# Patient Record
Sex: Female | Born: 1970
Health system: Southern US, Community
[De-identification: ages and names within clinical notes are randomized; demographics above are authoritative.]

## PROBLEM LIST (undated history)

## (undated) DIAGNOSIS — F32A Depression, unspecified: Secondary | ICD-10-CM

## (undated) DIAGNOSIS — R519 Headache, unspecified: Secondary | ICD-10-CM

## (undated) DIAGNOSIS — G4730 Sleep apnea, unspecified: Secondary | ICD-10-CM

## (undated) DIAGNOSIS — Z9889 Other specified postprocedural states: Secondary | ICD-10-CM

## (undated) DIAGNOSIS — R748 Abnormal levels of other serum enzymes: Secondary | ICD-10-CM

## (undated) DIAGNOSIS — Z8489 Family history of other specified conditions: Secondary | ICD-10-CM

## (undated) DIAGNOSIS — R112 Nausea with vomiting, unspecified: Secondary | ICD-10-CM

## (undated) DIAGNOSIS — F329 Major depressive disorder, single episode, unspecified: Secondary | ICD-10-CM

## (undated) HISTORY — PX: MASTOPEXY: SUR857

## (undated) HISTORY — PX: OTHER SURGICAL HISTORY: SHX169

## (undated) HISTORY — PX: TUBAL LIGATION: SHX77

## (undated) HISTORY — PX: BREAST SURGERY: SHX581

## (undated) HISTORY — PX: REDUCTION MAMMAPLASTY: SUR839

## (undated) HISTORY — DX: Depression, unspecified: F32.A

## (undated) HISTORY — DX: Major depressive disorder, single episode, unspecified: F32.9

## (undated) HISTORY — PX: NASAL SINUS SURGERY: SHX719

---

## 2008-10-08 ENCOUNTER — Ambulatory Visit: Payer: Self-pay | Admitting: Internal Medicine

## 2008-10-08 DIAGNOSIS — F329 Major depressive disorder, single episode, unspecified: Secondary | ICD-10-CM | POA: Insufficient documentation

## 2008-10-08 DIAGNOSIS — R5383 Other fatigue: Secondary | ICD-10-CM

## 2008-10-08 DIAGNOSIS — D235 Other benign neoplasm of skin of trunk: Secondary | ICD-10-CM | POA: Insufficient documentation

## 2008-10-08 DIAGNOSIS — R5381 Other malaise: Secondary | ICD-10-CM | POA: Insufficient documentation

## 2008-10-08 DIAGNOSIS — J019 Acute sinusitis, unspecified: Secondary | ICD-10-CM | POA: Insufficient documentation

## 2008-10-08 LAB — CONVERTED CEMR LAB
Albumin: 4.4 g/dL (ref 3.5–5.2)
BUN: 11 mg/dL (ref 6–23)
Basophils Absolute: 0.5 10*3/uL — ABNORMAL HIGH (ref 0.0–0.1)
CO2: 31 meq/L (ref 19–32)
Calcium: 9.6 mg/dL (ref 8.4–10.5)
Direct LDL: 141.7 mg/dL
Eosinophils Absolute: 0.4 10*3/uL (ref 0.0–0.7)
GFR calc non Af Amer: 100.17 mL/min (ref >60)
Glucose, Bld: 96 mg/dL (ref 70–99)
HCT: 42.3 % (ref 36.0–46.0)
HDL: 54.8 mg/dL (ref >39.00)
Hemoglobin: 14.3 g/dL (ref 12.0–15.0)
Iron: 125 ug/dL (ref 42–145)
Ketones, ur: NEGATIVE mg/dL
Leukocytes, UA: NEGATIVE
Lymphs Abs: 3.9 10*3/uL (ref 0.7–4.0)
MCHC: 33.7 g/dL (ref 30.0–36.0)
Monocytes Absolute: 0.5 10*3/uL (ref 0.1–1.0)
Monocytes Relative: 5.1 % (ref 3.0–12.0)
Neutro Abs: 5.1 10*3/uL (ref 1.4–7.7)
Nitrite: NEGATIVE
Platelets: 404 10*3/uL — ABNORMAL HIGH (ref 150.0–400.0)
Potassium: 3.7 meq/L (ref 3.5–5.1)
RDW: 11.9 % (ref 11.5–14.6)
Saturation Ratios: 31 % (ref 20.0–50.0)
Sodium: 142 meq/L (ref 135–145)
Specific Gravity, Urine: 1.015 (ref 1.000–1.030)
TSH: 2.11 microintl units/mL (ref 0.35–5.50)
Total Bilirubin: 0.8 mg/dL (ref 0.3–1.2)
Transferrin: 287.7 mg/dL (ref 212.0–360.0)
Triglycerides: 134 mg/dL (ref 0.0–149.0)
Urobilinogen, UA: 0.2 (ref 0.0–1.0)
pH: 7.5 (ref 5.0–8.0)

## 2008-10-09 ENCOUNTER — Encounter: Payer: Self-pay | Admitting: Internal Medicine

## 2008-10-13 ENCOUNTER — Encounter (INDEPENDENT_AMBULATORY_CARE_PROVIDER_SITE_OTHER): Payer: Self-pay | Admitting: *Deleted

## 2008-10-15 ENCOUNTER — Telehealth: Payer: Self-pay | Admitting: Internal Medicine

## 2009-03-10 ENCOUNTER — Telehealth: Payer: Self-pay | Admitting: Internal Medicine

## 2009-03-19 ENCOUNTER — Telehealth: Payer: Self-pay | Admitting: Internal Medicine

## 2010-03-23 NOTE — Progress Notes (Signed)
Summary: REQ FOR RX  Phone Note Call from Patient Call back at [phone]   Summary of Call: Pt c/o productive cough w/yellow mucus, body aches, and sinus congestion/drainage since friday. She has tried sudafed, claritin and mucinex w/no relief. Patient is requesting rx, she is unable to come into the office.  Initial call taken by: Lamar Sprinkles, CMA,  March 19, 2009 11:59 AM  Follow-up for Phone Call        done Follow-up by: Etta Grandchild MD,  March 19, 2009 12:02 PM  Additional Follow-up for Phone Call Additional follow up Details #1::        Pt informed  Additional Follow-up by: Lamar Sprinkles, CMA,  March 19, 2009 2:10 PM    New/Updated Medications: ZITHROMAX TRI-PAK 500 MG TAB (AZITHROMYCIN) Take as directed once daily for 3 days Prescriptions: ZITHROMAX TRI-PAK 500 MG TAB (AZITHROMYCIN) Take as directed once daily for 3 days  #3 x 0   Entered and Authorized by:   Etta Grandchild MD   Signed by:   Etta Grandchild MD on 03/19/2009   Method used:   Electronically to        [facility] #[id number]* (retail)       [address]       [place], [place]  [address]       Ph: [phone]       Fax: [phone]   RxID:   [id number]

## 2010-03-23 NOTE — Progress Notes (Signed)
Summary: Rx refills  Phone Note Call from Patient Call back at Home Phone [phone]   Caller: Patient Call For: Etta Grandchild MD Reason for Call: Refill Medication Summary of Call: Patient came into the office requesting refills of Buspirone and Lexapro. Initial call taken by: Irma Newness,  March 10, 2009 4:55 PM  Follow-up for Phone Call        Sent rx for wellbutrin/ pt should still have refills left on lexapro. Follow-up by: Rock Nephew CMA,  March 13, 2009 8:28 AM    Prescriptions: WELLBUTRIN XL 150 MG XR24H-TAB (BUPROPION HCL) One by mouth qam  #30 x 4   Entered by:   Rock Nephew CMA   Authorized by:   Etta Grandchild MD   Signed by:   Rock Nephew CMA on 03/11/2009   Method used:   Electronically to        [facility] #[id number]* (retail)       [address]       [place], [place]  [address]       Ph: [phone]       Fax: [phone]   RxID:   [id number]

## 2011-10-10 ENCOUNTER — Ambulatory Visit (INDEPENDENT_AMBULATORY_CARE_PROVIDER_SITE_OTHER): Payer: BC Managed Care – PPO | Admitting: Internal Medicine

## 2011-10-10 ENCOUNTER — Encounter: Payer: Self-pay | Admitting: Internal Medicine

## 2011-10-10 VITALS — BP 116/80 | HR 66 | Temp 97.9°F | Resp 15 | Wt 157.2 lb

## 2011-10-10 DIAGNOSIS — F329 Major depressive disorder, single episode, unspecified: Secondary | ICD-10-CM

## 2011-10-10 DIAGNOSIS — F3289 Other specified depressive episodes: Secondary | ICD-10-CM

## 2011-10-10 DIAGNOSIS — Z23 Encounter for immunization: Secondary | ICD-10-CM

## 2011-10-10 MED ORDER — DULOXETINE HCL 60 MG PO CPEP: 60.0000 mg | ORAL_CAPSULE | Freq: Every day | ORAL | Status: DC

## 2011-10-10 NOTE — Assessment & Plan Note (Signed)
Start cymbalta and begin psychotherapy

## 2011-10-10 NOTE — Progress Notes (Signed)
  Subjective:    Patient ID: Jennifer Mcknight, female    DOB: [birth year], 41 y.o.   MRN: [id number]  HPI She returns and complains of depression s/s for one month with ruminations, sleep disturbance, weight loss, anhedonia, crying spells, and fatigue.   Review of Systems  Constitutional: Positive for unexpected weight change (some weight loss). Negative for fever, chills, diaphoresis, activity change, appetite change and fatigue.  HENT: Negative.   Eyes: Negative.   Respiratory: Negative.  Negative for cough, chest tightness, shortness of breath, wheezing and stridor.   Cardiovascular: Negative for chest pain, palpitations and leg swelling.  Gastrointestinal: Negative.   Genitourinary: Negative.   Musculoskeletal: Negative.   Skin: Negative.   Neurological: Negative.   Hematological: Negative for adenopathy. Does not bruise/bleed easily.  Psychiatric/Behavioral: Positive for disturbed wake/sleep cycle, dysphoric mood and decreased concentration. Negative for suicidal ideas, hallucinations, behavioral problems, confusion, self-injury and agitation. The patient is not nervous/anxious and is not hyperactive.        Objective:   Physical Exam  Vitals reviewed. Constitutional: She is oriented to person, place, and time. She appears well-developed and well-nourished. No distress.  HENT:  Head: Normocephalic and atraumatic.  Mouth/Throat: Oropharynx is clear and moist. No oropharyngeal exudate.  Eyes: Conjunctivae are normal. Right eye exhibits no discharge. Left eye exhibits no discharge. No scleral icterus.  Neck: Normal range of motion. Neck supple. No JVD present. No tracheal deviation present. No thyromegaly present.  Cardiovascular: Normal rate, regular rhythm, normal heart sounds and intact distal pulses.  Exam reveals no gallop and no friction rub.   No murmur heard. Pulmonary/Chest: Effort normal and breath sounds normal. No stridor. No respiratory distress. She has no wheezes. She has  no rales. She exhibits no tenderness.  Abdominal: Soft. Bowel sounds are normal. She exhibits no distension and no mass. There is no tenderness. There is no rebound and no guarding.  Lymphadenopathy:    She has no cervical adenopathy.  Neurological: She is oriented to person, place, and time.  Skin: Skin is warm and dry. No rash noted. She is not diaphoretic. No erythema. No pallor.  Psychiatric: Her behavior is normal. Judgment and thought content normal. Her mood appears not anxious. Her affect is not angry, not blunt, not labile and not inappropriate. Her speech is not rapid and/or pressured, not delayed, not tangential and not slurred. Cognition and memory are normal. She exhibits a depressed mood (tearful). She is communicative.          Assessment & Plan:

## 2011-10-10 NOTE — Patient Instructions (Signed)

## 2011-10-21 ENCOUNTER — Ambulatory Visit (INDEPENDENT_AMBULATORY_CARE_PROVIDER_SITE_OTHER): Payer: BC Managed Care – PPO | Admitting: Psychology

## 2011-10-21 DIAGNOSIS — F411 Generalized anxiety disorder: Secondary | ICD-10-CM

## 2011-10-21 DIAGNOSIS — F331 Major depressive disorder, recurrent, moderate: Secondary | ICD-10-CM

## 2011-10-31 ENCOUNTER — Ambulatory Visit (INDEPENDENT_AMBULATORY_CARE_PROVIDER_SITE_OTHER): Payer: BC Managed Care – PPO | Admitting: Psychology

## 2011-10-31 DIAGNOSIS — F331 Major depressive disorder, recurrent, moderate: Secondary | ICD-10-CM

## 2011-10-31 DIAGNOSIS — F411 Generalized anxiety disorder: Secondary | ICD-10-CM

## 2011-11-07 ENCOUNTER — Telehealth: Payer: Self-pay | Admitting: Internal Medicine

## 2011-11-07 DIAGNOSIS — F3289 Other specified depressive episodes: Secondary | ICD-10-CM

## 2011-11-07 DIAGNOSIS — F329 Major depressive disorder, single episode, unspecified: Secondary | ICD-10-CM

## 2011-11-07 MED ORDER — ESCITALOPRAM OXALATE 10 MG PO TABS: 10.0000 mg | ORAL_TABLET | Freq: Every day | ORAL | Status: DC

## 2011-11-07 NOTE — Telephone Encounter (Signed)
Caller: Jennifer Mcknight/Patient; Patient Name: Jennifer Mcknight; PCP: Sanda Linger (Adults only); Best Callback Phone Number: [phone].  Pt reports she has been taking Cymbalta for a month.  Pt states her depression is worse.  She wants to know if she should fill the Cymbatla RX or get represcribed the Lexapro.  Pt states she is really lethargic and wants to sleep all the time.  Triaged patient per Depression Protocol.  Call Provider Within 24 Hours Disposition for 'New or increasing symptoms and taking medications/following therapy as prescribed'.  PLEASE FOLLOW UP WITH PT REGARDING CONTINUING CYMBALTA OR SWITCHING MEDICATION TO LEXAPRO.

## 2011-11-07 NOTE — Telephone Encounter (Signed)
Go back to lexapro

## 2011-11-07 NOTE — Telephone Encounter (Signed)
LMOVM for Pt to return call.  

## 2011-11-07 NOTE — Telephone Encounter (Signed)
Pt stated that would be fine. Could a script be sent in to [facility] in [place].

## 2011-11-07 NOTE — Telephone Encounter (Signed)
done

## 2011-11-08 NOTE — Telephone Encounter (Signed)
Pt informed rx sent to Pharmacy via VM and to callback office with any questions/concerns.

## 2011-11-21 ENCOUNTER — Ambulatory Visit (INDEPENDENT_AMBULATORY_CARE_PROVIDER_SITE_OTHER): Payer: BC Managed Care – PPO | Admitting: Psychology

## 2011-11-21 DIAGNOSIS — F331 Major depressive disorder, recurrent, moderate: Secondary | ICD-10-CM

## 2011-11-21 DIAGNOSIS — F411 Generalized anxiety disorder: Secondary | ICD-10-CM

## 2011-12-19 ENCOUNTER — Ambulatory Visit (INDEPENDENT_AMBULATORY_CARE_PROVIDER_SITE_OTHER): Payer: BC Managed Care – PPO | Admitting: Psychology

## 2011-12-19 DIAGNOSIS — F331 Major depressive disorder, recurrent, moderate: Secondary | ICD-10-CM

## 2011-12-19 DIAGNOSIS — F411 Generalized anxiety disorder: Secondary | ICD-10-CM

## 2013-03-21 ENCOUNTER — Other Ambulatory Visit (INDEPENDENT_AMBULATORY_CARE_PROVIDER_SITE_OTHER): Payer: BC Managed Care – PPO

## 2013-03-21 ENCOUNTER — Encounter: Payer: Self-pay | Admitting: Internal Medicine

## 2013-03-21 ENCOUNTER — Ambulatory Visit (INDEPENDENT_AMBULATORY_CARE_PROVIDER_SITE_OTHER): Payer: BC Managed Care – PPO | Admitting: Internal Medicine

## 2013-03-21 VITALS — BP 120/84 | HR 66 | Temp 98.0°F | Resp 16 | Ht 67.0 in | Wt 176.0 lb

## 2013-03-21 DIAGNOSIS — F3289 Other specified depressive episodes: Secondary | ICD-10-CM

## 2013-03-21 DIAGNOSIS — Z0000 Encounter for general adult medical examination without abnormal findings: Secondary | ICD-10-CM

## 2013-03-21 DIAGNOSIS — F329 Major depressive disorder, single episode, unspecified: Secondary | ICD-10-CM

## 2013-03-21 LAB — COMPREHENSIVE METABOLIC PANEL
ALBUMIN: 4.4 g/dL (ref 3.5–5.2)
ALT: 21 U/L (ref 0–35)
AST: 22 U/L (ref 0–37)
Alkaline Phosphatase: 53 U/L (ref 39–117)
BUN: 12 mg/dL (ref 6–23)
CALCIUM: 9.6 mg/dL (ref 8.4–10.5)
CHLORIDE: 102 meq/L (ref 96–112)
CO2: 27 mEq/L (ref 19–32)
CREATININE: 0.6 mg/dL (ref 0.4–1.2)
GFR: 116.94 mL/min (ref >60.00)
Glucose, Bld: 86 mg/dL (ref 70–99)
Potassium: 3.8 mEq/L (ref 3.5–5.1)
Sodium: 137 mEq/L (ref 135–145)
TOTAL PROTEIN: 7.9 g/dL (ref 6.0–8.3)
Total Bilirubin: 0.7 mg/dL (ref 0.3–1.2)

## 2013-03-21 LAB — CBC WITH DIFFERENTIAL/PLATELET
BASOS ABS: 0 10*3/uL (ref 0.0–0.1)
Basophils Relative: 0.3 % (ref 0.0–3.0)
Eosinophils Absolute: 0.5 10*3/uL (ref 0.0–0.7)
Eosinophils Relative: 4.5 % (ref 0.0–5.0)
HCT: 45.4 % (ref 36.0–46.0)
Hemoglobin: 14.8 g/dL (ref 12.0–15.0)
LYMPHS PCT: 44 % (ref 12.0–46.0)
Lymphs Abs: 4.9 10*3/uL — ABNORMAL HIGH (ref 0.7–4.0)
MCHC: 32.7 g/dL (ref 30.0–36.0)
MCV: 89.3 fl (ref 78.0–100.0)
MONOS PCT: 6.5 % (ref 3.0–12.0)
Monocytes Absolute: 0.7 10*3/uL (ref 0.1–1.0)
NEUTROS PCT: 44.7 % (ref 43.0–77.0)
Neutro Abs: 5 10*3/uL (ref 1.4–7.7)
PLATELETS: 410 10*3/uL — AB (ref 150.0–400.0)
RBC: 5.08 Mil/uL (ref 3.87–5.11)
RDW: 13.3 % (ref 11.5–14.6)
WBC: 11.3 10*3/uL — ABNORMAL HIGH (ref 4.5–10.5)

## 2013-03-21 LAB — URINALYSIS, ROUTINE W REFLEX MICROSCOPIC
Bilirubin Urine: NEGATIVE
Ketones, ur: NEGATIVE
Leukocytes, UA: NEGATIVE
Nitrite: NEGATIVE
Total Protein, Urine: NEGATIVE
UROBILINOGEN UA: 0.2 (ref 0.0–1.0)
Urine Glucose: NEGATIVE
pH: 5.5 (ref 5.0–8.0)

## 2013-03-21 LAB — LDL CHOLESTEROL, DIRECT: Direct LDL: 123.1 mg/dL

## 2013-03-21 LAB — LIPID PANEL
CHOLESTEROL: 206 mg/dL — AB (ref 0–200)
HDL: 58.2 mg/dL (ref >39.00)
Total CHOL/HDL Ratio: 4
Triglycerides: 111 mg/dL (ref 0.0–149.0)
VLDL: 22.2 mg/dL (ref 0.0–40.0)

## 2013-03-21 LAB — TSH: TSH: 2.06 u[IU]/mL (ref 0.35–5.50)

## 2013-03-21 MED ORDER — LEVOMILNACIPRAN HCL ER 80 MG PO CP24: 1.0000 | ORAL_CAPSULE | Freq: Every day | ORAL | Status: DC

## 2013-03-21 NOTE — Patient Instructions (Signed)
Preventive Care for Adults, Female A healthy lifestyle and preventive care can promote health and wellness. Preventive health guidelines for women include the following key practices.  A routine yearly physical is a good way to check with your health care provider about your health and preventive screening. It is a chance to share any concerns and updates on your health and to receive a thorough exam.  Visit your dentist for a routine exam and preventive care every 6 months. Brush your teeth twice a day and floss once a day. Good oral hygiene prevents tooth decay and gum disease.  The frequency of eye exams is based on your age, health, family medical history, use of contact lenses, and other factors. Follow your health care provider's recommendations for frequency of eye exams.  Eat a healthy diet. Foods like vegetables, fruits, whole grains, low-fat dairy products, and lean protein foods contain the nutrients you need without too many calories. Decrease your intake of foods high in solid fats, added sugars, and salt. Eat the right amount of calories for you.Get information about a proper diet from your health care provider, if necessary.  Regular physical exercise is one of the most important things you can do for your health. Most adults should get at least 150 minutes of moderate-intensity exercise (any activity that increases your heart rate and causes you to sweat) each week. In addition, most adults need muscle-strengthening exercises on 2 or more days a week.  Maintain a healthy weight. The body mass index (BMI) is a screening tool to identify possible weight problems. It provides an estimate of body fat based on height and weight. Your health care provider can find your BMI, and can help you achieve or maintain a healthy weight.For adults 20 years and older:  A BMI below 18.5 is considered underweight.  A BMI of 18.5 to 24.9 is normal.  A BMI of 25 to 29.9 is considered overweight.  A  BMI of 30 and above is considered obese.  Maintain normal blood lipids and cholesterol levels by exercising and minimizing your intake of saturated fat. Eat a balanced diet with plenty of fruit and vegetables. Blood tests for lipids and cholesterol should begin at age 62 and be repeated every 5 years. If your lipid or cholesterol levels are high, you are over 50, or you are at high risk for heart disease, you may need your cholesterol levels checked more frequently.Ongoing high lipid and cholesterol levels should be treated with medicines if diet and exercise are not working.  If you smoke, find out from your health care provider how to quit. If you do not use tobacco, do not start.  Lung cancer screening is recommended for adults aged 36 80 years who are at high risk for developing lung cancer because of a history of smoking. A yearly low-dose CT scan of the lungs is recommended for people who have at least a 30-pack-year history of smoking and are a current smoker or have quit within the past 15 years. A pack year of smoking is smoking an average of 1 pack of cigarettes a day for 1 year (for example: 1 pack a day for 30 years or 2 packs a day for 15 years). Yearly screening should continue until the smoker has stopped smoking for at least 15 years. Yearly screening should be stopped for people who develop a health problem that would prevent them from having lung cancer treatment.  If you are pregnant, do not drink alcohol. If you  are breastfeeding, be very cautious about drinking alcohol. If you are not pregnant and choose to drink alcohol, do not have more than 1 drink per day. One drink is considered to be 12 ounces (355 mL) of beer, 5 ounces (148 mL) of wine, or 1.5 ounces (44 mL) of liquor.  Avoid use of street drugs. Do not share needles with anyone. Ask for help if you need support or instructions about stopping the use of drugs.  High blood pressure causes heart disease and increases the risk  of stroke. Your blood pressure should be checked at least every 1 to 2 years. Ongoing high blood pressure should be treated with medicines if weight loss and exercise do not work.  If you are 39 42 years old, ask your health care provider if you should take aspirin to prevent strokes.  Diabetes screening involves taking a blood sample to check your fasting blood sugar level. This should be done once every 3 years, after age 56, if you are within normal weight and without risk factors for diabetes. Testing should be considered at a younger age or be carried out more frequently if you are overweight and have at least 1 risk factor for diabetes.  Breast cancer screening is essential preventive care for women. You should practice "breast self-awareness." This means understanding the normal appearance and feel of your breasts and may include breast self-examination. Any changes detected, no matter how small, should be reported to a health care provider. Women in their 40s and 30s should have a clinical breast exam (CBE) by a health care provider as part of a regular health exam every 1 to 3 years. After age 28, women should have a CBE every year. Starting at age 72, women should consider having a mammogram (breast X-ray test) every year. Women who have a family history of breast cancer should talk to their health care provider about genetic screening. Women at a high risk of breast cancer should talk to their health care providers about having an MRI and a mammogram every year.  Breast cancer gene (BRCA)-related cancer risk assessment is recommended for women who have family members with BRCA-related cancers. BRCA-related cancers include breast, ovarian, tubal, and peritoneal cancers. Having family members with these cancers may be associated with an increased risk for harmful changes (mutations) in the breast cancer genes BRCA1 and BRCA2. Results of the assessment will determine the need for genetic counseling  and BRCA1 and BRCA2 testing.  The Pap test is a screening test for cervical cancer. A Pap test can show cell changes on the cervix that might become cervical cancer if left untreated. A Pap test is a procedure in which cells are obtained and examined from the lower end of the uterus (cervix).  Women should have a Pap test starting at age 59.  Between ages 42 and 13, Pap tests should be repeated every 2 years.  Beginning at age 53, you should have a Pap test every 3 years as long as the past 3 Pap tests have been normal.  Some women have medical problems that increase the chance of getting cervical cancer. Talk to your health care provider about these problems. It is especially important to talk to your health care provider if a new problem develops soon after your last Pap test. In these cases, your health care provider may recommend more frequent screening and Pap tests.  The above recommendations are the same for women who have or have not gotten the vaccine  for human papillomavirus (HPV).  If you had a hysterectomy for a problem that was not cancer or a condition that could lead to cancer, then you no longer need Pap tests. Even if you no longer need a Pap test, a regular exam is a good idea to make sure no other problems are starting.  If you are between ages 58 and 10 years, and you have had normal Pap tests going back 10 years, you no longer need Pap tests. Even if you no longer need a Pap test, a regular exam is a good idea to make sure no other problems are starting.  If you have had past treatment for cervical cancer or a condition that could lead to cancer, you need Pap tests and screening for cancer for at least 20 years after your treatment.  If Pap tests have been discontinued, risk factors (such as a new sexual partner) need to be reassessed to determine if screening should be resumed.  The HPV test is an additional test that may be used for cervical cancer screening. The HPV test  looks for the virus that can cause the cell changes on the cervix. The cells collected during the Pap test can be tested for HPV. The HPV test could be used to screen women aged 67 years and older, and should be used in women of any age who have unclear Pap test results. After the age of 65, women should have HPV testing at the same frequency as a Pap test.  Colorectal cancer can be detected and often prevented. Most routine colorectal cancer screening begins at the age of 25 years and continues through age 66 years. However, your health care provider may recommend screening at an earlier age if you have risk factors for colon cancer. On a yearly basis, your health care provider may provide home test kits to check for hidden blood in the stool. Use of a small camera at the end of a tube, to directly examine the colon (sigmoidoscopy or colonoscopy), can detect the earliest forms of colorectal cancer. Talk to your health care provider about this at age 79, when routine screening begins. Direct exam of the colon should be repeated every 5 10 years through age 47 years, unless early forms of pre-cancerous polyps or small growths are found.  People who are at an increased risk for hepatitis B should be screened for this virus. You are considered at high risk for hepatitis B if:  You were born in a country where hepatitis B occurs often. Talk with your health care provider about which countries are considered high risk.  Your parents were born in a high-risk country and you have not received a shot to protect against hepatitis B (hepatitis B vaccine).  You have HIV or AIDS.  You use needles to inject street drugs.  You live with, or have sex with, someone who has Hepatitis B.  You get hemodialysis treatment.  You take certain medicines for conditions like cancer, organ transplantation, and autoimmune conditions.  Hepatitis C blood testing is recommended for all people born from [birth year] through 1965 and  any individual with known risks for hepatitis C.  Practice safe sex. Use condoms and avoid high-risk sexual practices to reduce the spread of sexually transmitted infections (STIs). STIs include gonorrhea, chlamydia, syphilis, trichomonas, herpes, HPV, and human immunodeficiency virus (HIV). Herpes, HIV, and HPV are viral illnesses that have no cure. They can result in disability, cancer, and death. Sexually active women aged 66  years and younger should be checked for chlamydia. Older women with new or multiple partners should also be tested for chlamydia. Testing for other STIs is recommended if you are sexually active and at increased risk.  Osteoporosis is a disease in which the bones lose minerals and strength with aging. This can result in serious bone fractures or breaks. The risk of osteoporosis can be identified using a bone density scan. Women ages 18 years and over and women at risk for fractures or osteoporosis should discuss screening with their health care providers. Ask your health care provider whether you should take a calcium supplement or vitamin D to reduce the rate of osteoporosis.  Menopause can be associated with physical symptoms and risks. Hormone replacement therapy is available to decrease symptoms and risks. You should talk to your health care provider about whether hormone replacement therapy is right for you.  Use sunscreen. Apply sunscreen liberally and repeatedly throughout the day. You should seek shade when your shadow is shorter than you. Protect yourself by wearing long sleeves, pants, a wide-brimmed hat, and sunglasses year round, whenever you are outdoors.  Once a month, do a whole body skin exam, using a mirror to look at the skin on your back. Tell your health care provider of new moles, moles that have irregular borders, moles that are larger than a pencil eraser, or moles that have changed in shape or color.  Stay current with required vaccines  (immunizations).  Influenza vaccine. All adults should be immunized every year.  Tetanus, diphtheria, and acellular pertussis (Td, Tdap) vaccine. Pregnant women should receive 1 dose of Tdap vaccine during each pregnancy. The dose should be obtained regardless of the length of time since the last dose. Immunization is preferred during the 27th 36th week of gestation. An adult who has not previously received Tdap or who does not know her vaccine status should receive 1 dose of Tdap. This initial dose should be followed by tetanus and diphtheria toxoids (Td) booster doses every 10 years. Adults with an unknown or incomplete history of completing a 3-dose immunization series with Td-containing vaccines should begin or complete a primary immunization series including a Tdap dose. Adults should receive a Td booster every 10 years.  Varicella vaccine. An adult without evidence of immunity to varicella should receive 2 doses or a second dose if she has previously received 1 dose. Pregnant females who do not have evidence of immunity should receive the first dose after pregnancy. This first dose should be obtained before leaving the health care facility. The second dose should be obtained 4 8 weeks after the first dose.  Human papillomavirus (HPV) vaccine. Females aged 9 26 years who have not received the vaccine previously should obtain the 3-dose series. The vaccine is not recommended for use in pregnant females. However, pregnancy testing is not needed before receiving a dose. If a female is found to be pregnant after receiving a dose, no treatment is needed. In that case, the remaining doses should be delayed until after the pregnancy. Immunization is recommended for any person with an immunocompromised condition through the age of 51 years if she did not get any or all doses earlier. During the 3-dose series, the second dose should be obtained 4 8 weeks after the first dose. The third dose should be obtained  24 weeks after the first dose and 16 weeks after the second dose.  Zoster vaccine. One dose is recommended for adults aged 57 years or older unless certain  conditions are present.  Measles, mumps, and rubella (MMR) vaccine. Adults born before [birth year] generally are considered immune to measles and mumps. Adults born in [birth year] or later should have 1 or more doses of MMR vaccine unless there is a contraindication to the vaccine or there is laboratory evidence of immunity to each of the three diseases. A routine second dose of MMR vaccine should be obtained at least 28 days after the first dose for students attending postsecondary schools, health care workers, or international travelers. People who received inactivated measles vaccine or an unknown type of measles vaccine during 1963 1967 should receive 2 doses of MMR vaccine. People who received inactivated mumps vaccine or an unknown type of mumps vaccine before 1979 and are at high risk for mumps infection should consider immunization with 2 doses of MMR vaccine. For females of childbearing age, rubella immunity should be determined. If there is no evidence of immunity, females who are not pregnant should be vaccinated. If there is no evidence of immunity, females who are pregnant should delay immunization until after pregnancy. Unvaccinated health care workers born before [birth year] who lack laboratory evidence of measles, mumps, or rubella immunity or laboratory confirmation of disease should consider measles and mumps immunization with 2 doses of MMR vaccine or rubella immunization with 1 dose of MMR vaccine.  Pneumococcal 13-valent conjugate (PCV13) vaccine. When indicated, a person who is uncertain of her immunization history and has no record of immunization should receive the PCV13 vaccine. An adult aged 42 years or older who has certain medical conditions and has not been previously immunized should receive 1 dose of PCV13 vaccine. This PCV13 should be followed  with a dose of pneumococcal polysaccharide (PPSV23) vaccine. The PPSV23 vaccine dose should be obtained at least 8 weeks after the dose of PCV13 vaccine. An adult aged 4 years or older who has certain medical conditions and previously received 1 or more doses of PPSV23 vaccine should receive 1 dose of PCV13. The PCV13 vaccine dose should be obtained 1 or more years after the last PPSV23 vaccine dose.  Pneumococcal polysaccharide (PPSV23) vaccine. When PCV13 is also indicated, PCV13 should be obtained first. All adults aged 27 years and older should be immunized. An adult younger than age 33 years who has certain medical conditions should be immunized. Any person who resides in a nursing home or long-term care facility should be immunized. An adult smoker should be immunized. People with an immunocompromised condition and certain other conditions should receive both PCV13 and PPSV23 vaccines. People with human immunodeficiency virus (HIV) infection should be immunized as soon as possible after diagnosis. Immunization during chemotherapy or radiation therapy should be avoided. Routine use of PPSV23 vaccine is not recommended for American Indians, [place] Natives, or people younger than 65 years unless there are medical conditions that require PPSV23 vaccine. When indicated, people who have unknown immunization and have no record of immunization should receive PPSV23 vaccine. One-time revaccination 5 years after the first dose of PPSV23 is recommended for people aged 13 64 years who have chronic kidney failure, nephrotic syndrome, asplenia, or immunocompromised conditions. People who received 1 2 doses of PPSV23 before age 66 years should receive another dose of PPSV23 vaccine at age 27 years or later if at least 5 years have passed since the previous dose. Doses of PPSV23 are not needed for people immunized with PPSV23 at or after age 33 years.  Meningococcal vaccine. Adults with asplenia or persistent complement  component deficiencies should receive 2  doses of quadrivalent meningococcal conjugate (MenACWY-D) vaccine. The doses should be obtained at least 2 months apart. Microbiologists working with certain meningococcal bacteria, [place] recruits, people at risk during an outbreak, and people who travel to or live in countries with a high rate of meningitis should be immunized. A first-year college student up through age 49 years who is living in a residence hall should receive a dose if she did not receive a dose on or after her 16th birthday. Adults who have certain high-risk conditions should receive one or more doses of vaccine.  Hepatitis A vaccine. Adults who wish to be protected from this disease, have certain high-risk conditions, work with hepatitis A-infected animals, work in hepatitis A research labs, or travel to or work in countries with a high rate of hepatitis A should be immunized. Adults who were previously unvaccinated and who anticipate close contact with an international adoptee during the first 60 days after arrival in the [place] from a country with a high rate of hepatitis A should be immunized.  Hepatitis B vaccine. Adults who wish to be protected from this disease, have certain high-risk conditions, may be exposed to blood or other infectious body fluids, are household contacts or sex partners of hepatitis B positive people, are clients or workers in certain care facilities, or travel to or work in countries with a high rate of hepatitis B should be immunized.  Haemophilus influenzae type b (Hib) vaccine. A previously unvaccinated person with asplenia or sickle cell disease or having a scheduled splenectomy should receive 1 dose of Hib vaccine. Regardless of previous immunization, a recipient of a hematopoietic stem cell transplant should receive a 3-dose series 6 12 months after her successful transplant. Hib vaccine is not recommended for adults with HIV infection. Preventive  Services / Frequency Ages 24 to 39years  Blood pressure check.** / Every 1 to 2 years.  Lipid and cholesterol check.** / Every 5 years beginning at age 66.  Clinical breast exam.** / Every 3 years for women in their 12s and 24s.  BRCA-related cancer risk assessment.** / For women who have family members with a BRCA-related cancer (breast, ovarian, tubal, or peritoneal cancers).  Pap test.** / Every 2 years from ages 31 through 69. Every 3 years starting at age 64 through age 76 or 89 with a history of 3 consecutive normal Pap tests.  HPV screening.** / Every 3 years from ages 10 through ages 10 to 96 with a history of 3 consecutive normal Pap tests.  Hepatitis C blood test.** / For any individual with known risks for hepatitis C.  Skin self-exam. / Monthly.  Influenza vaccine. / Every year.  Tetanus, diphtheria, and acellular pertussis (Tdap, Td) vaccine.** / Consult your health care provider. Pregnant women should receive 1 dose of Tdap vaccine during each pregnancy. 1 dose of Td every 10 years.  Varicella vaccine.** / Consult your health care provider. Pregnant females who do not have evidence of immunity should receive the first dose after pregnancy.  HPV vaccine. / 3 doses over 6 months, if 90 and younger. The vaccine is not recommended for use in pregnant females. However, pregnancy testing is not needed before receiving a dose.  Measles, mumps, rubella (MMR) vaccine.** / You need at least 1 dose of MMR if you were born in 1957 or later. You may also need a 2nd dose. For females of childbearing age, rubella immunity should be determined. If there is no evidence of immunity, females who are not  pregnant should be vaccinated. If there is no evidence of immunity, females who are pregnant should delay immunization until after pregnancy.  Pneumococcal 13-valent conjugate (PCV13) vaccine.** / Consult your health care provider.  Pneumococcal polysaccharide (PPSV23) vaccine.** / 1 to 2  doses if you smoke cigarettes or if you have certain conditions.  Meningococcal vaccine.** / 1 dose if you are age 88 to 6 years and a Market researcher living in a residence hall, or have one of several medical conditions, you need to get vaccinated against meningococcal disease. You may also need additional booster doses.  Hepatitis A vaccine.** / Consult your health care provider.  Hepatitis B vaccine.** / Consult your health care provider.  Haemophilus influenzae type b (Hib) vaccine.** / Consult your health care provider. Ages 23 to 64years  Blood pressure check.** / Every 1 to 2 years.  Lipid and cholesterol check.** / Every 5 years beginning at age 20 years.  Lung cancer screening. / Every year if you are aged 51 80 years and have a 30-pack-year history of smoking and currently smoke or have quit within the past 15 years. Yearly screening is stopped once you have quit smoking for at least 15 years or develop a health problem that would prevent you from having lung cancer treatment.  Clinical breast exam.** / Every year after age 8 years.  BRCA-related cancer risk assessment.** / For women who have family members with a BRCA-related cancer (breast, ovarian, tubal, or peritoneal cancers).  Mammogram.** / Every year beginning at age 10 years and continuing for as long as you are in good health. Consult with your health care provider.  Pap test.** / Every 3 years starting at age 30 years through age 5 or 61 years with a history of 3 consecutive normal Pap tests.  HPV screening.** / Every 3 years from ages 39 years through ages 72 to 19 years with a history of 3 consecutive normal Pap tests.  Fecal occult blood test (FOBT) of stool. / Every year beginning at age 59 years and continuing until age 27 years. You may not need to do this test if you get a colonoscopy every 10 years.  Flexible sigmoidoscopy or colonoscopy.** / Every 5 years for a flexible sigmoidoscopy or every  10 years for a colonoscopy beginning at age 110 years and continuing until age 63 years.  Hepatitis C blood test.** / For all people born from [birth year] through 1964 and any individual with known risks for hepatitis C.  Skin self-exam. / Monthly.  Influenza vaccine. / Every year.  Tetanus, diphtheria, and acellular pertussis (Tdap/Td) vaccine.** / Consult your health care provider. Pregnant women should receive 1 dose of Tdap vaccine during each pregnancy. 1 dose of Td every 10 years.  Varicella vaccine.** / Consult your health care provider. Pregnant females who do not have evidence of immunity should receive the first dose after pregnancy.  Zoster vaccine.** / 1 dose for adults aged 46 years or older.  Measles, mumps, rubella (MMR) vaccine.** / You need at least 1 dose of MMR if you were born in 1956 or later. You may also need a 2nd dose. For females of childbearing age, rubella immunity should be determined. If there is no evidence of immunity, females who are not pregnant should be vaccinated. If there is no evidence of immunity, females who are pregnant should delay immunization until after pregnancy.  Pneumococcal 13-valent conjugate (PCV13) vaccine.** / Consult your health care provider.  Pneumococcal polysaccharide (PPSV23) vaccine.** / 1  to 2 doses if you smoke cigarettes or if you have certain conditions.  Meningococcal vaccine.** / Consult your health care provider.  Hepatitis A vaccine.** / Consult your health care provider.  Hepatitis B vaccine.** / Consult your health care provider.  Haemophilus influenzae type b (Hib) vaccine.** / Consult your health care provider. Ages 71 years and over  Blood pressure check.** / Every 1 to 2 years.  Lipid and cholesterol check.** / Every 5 years beginning at age 13 years.  Lung cancer screening. / Every year if you are aged 42 80 years and have a 30-pack-year history of smoking and currently smoke or have quit within the past 15 years.  Yearly screening is stopped once you have quit smoking for at least 15 years or develop a health problem that would prevent you from having lung cancer treatment.  Clinical breast exam.** / Every year after age 31 years.  BRCA-related cancer risk assessment.** / For women who have family members with a BRCA-related cancer (breast, ovarian, tubal, or peritoneal cancers).  Mammogram.** / Every year beginning at age 45 years and continuing for as long as you are in good health. Consult with your health care provider.  Pap test.** / Every 3 years starting at age 27 years through age 62 or 44 years with 3 consecutive normal Pap tests. Testing can be stopped between 65 and 70 years with 3 consecutive normal Pap tests and no abnormal Pap or HPV tests in the past 10 years.  HPV screening.** / Every 3 years from ages 96 years through ages 67 or 27 years with a history of 3 consecutive normal Pap tests. Testing can be stopped between 65 and 70 years with 3 consecutive normal Pap tests and no abnormal Pap or HPV tests in the past 10 years.  Fecal occult blood test (FOBT) of stool. / Every year beginning at age 47 years and continuing until age 88 years. You may not need to do this test if you get a colonoscopy every 10 years.  Flexible sigmoidoscopy or colonoscopy.** / Every 5 years for a flexible sigmoidoscopy or every 10 years for a colonoscopy beginning at age 93 years and continuing until age 66 years.  Hepatitis C blood test.** / For all people born from [birth year] through 1964 and any individual with known risks for hepatitis C.  Osteoporosis screening.** / A one-time screening for women ages 64 years and over and women at risk for fractures or osteoporosis.  Skin self-exam. / Monthly.  Influenza vaccine. / Every year.  Tetanus, diphtheria, and acellular pertussis (Tdap/Td) vaccine.** / 1 dose of Td every 10 years.  Varicella vaccine.** / Consult your health care provider.  Zoster vaccine.** / 1  dose for adults aged 70 years or older.  Pneumococcal 13-valent conjugate (PCV13) vaccine.** / Consult your health care provider.  Pneumococcal polysaccharide (PPSV23) vaccine.** / 1 dose for all adults aged 59 years and older.  Meningococcal vaccine.** / Consult your health care provider.  Hepatitis A vaccine.** / Consult your health care provider.  Hepatitis B vaccine.** / Consult your health care provider.  Haemophilus influenzae type b (Hib) vaccine.** / Consult your health care provider. ** Family history and personal history of risk and conditions may change your health care provider's recommendations. Document Released: 04/05/2001 Document Revised: 11/28/2012 Document Reviewed: 07/05/2010 [facility] Patient Information 2014 [place], [place]. Depression, Adult Depression refers to feeling sad, low, down in the dumps, blue, gloomy, or empty. In general, there are two kinds of depression: 1.  Depression that we all experience from time to time because of upsetting life experiences, including the loss of a job or the ending of a relationship (normal sadness or normal grief). This kind of depression is considered normal, is short lived, and resolves within a few days to 2 weeks. (Depression experienced after the loss of a loved one is called bereavement. Bereavement often lasts longer than 2 weeks but normally gets better with time.) 2. Clinical depression, which lasts longer than normal sadness or normal grief or interferes with your ability to function at home, at work, and in school. It also interferes with your personal relationships. It affects almost every aspect of your life. Clinical depression is an illness. Symptoms of depression also can be caused by conditions other than normal sadness and grief or clinical depression. Examples of these conditions are listed as follows:  Physical illness Some physical illnesses, including underactive thyroid gland (hypothyroidism), severe anemia,  specific types of cancer, diabetes, uncontrolled seizures, heart and lung problems, strokes, and chronic pain are commonly associated with symptoms of depression.  Side effects of some prescription medicine In some people, certain types of prescription medicine can cause symptoms of depression.  Substance abuse Abuse of alcohol and illicit drugs can cause symptoms of depression. SYMPTOMS Symptoms of normal sadness and normal grief include the following:  Feeling sad or crying for short periods of time.  Not caring about anything (apathy).  Difficulty sleeping or sleeping too much.  No longer able to enjoy the things you used to enjoy.  Desire to be by oneself all the time (social isolation).  Lack of energy or motivation.  Difficulty concentrating or remembering.  Change in appetite or weight.  Restlessness or agitation. Symptoms of clinical depression include the same symptoms of normal sadness or normal grief and also the following symptoms:  Feeling sad or crying all the time.  Feelings of guilt or worthlessness.  Feelings of hopelessness or helplessness.  Thoughts of suicide or the desire to harm yourself (suicidal ideation).  Loss of touch with reality (psychotic symptoms). Seeing or hearing things that are not real (hallucinations) or having false beliefs about your life or the people around you (delusions and paranoia). DIAGNOSIS  The diagnosis of clinical depression usually is based on the severity and duration of the symptoms. Your caregiver also will ask you questions about your medical history and substance use to find out if physical illness, use of prescription medicine, or substance abuse is causing your depression. Your caregiver also may order blood tests. TREATMENT  Typically, normal sadness and normal grief do not require treatment. However, sometimes antidepressant medicine is prescribed for bereavement to ease the depressive symptoms until they resolve. The  treatment for clinical depression depends on the severity of your symptoms but typically includes antidepressant medicine, counseling with a mental health professional, or a combination of both. Your caregiver will help to determine what treatment is best for you. Depression caused by physical illness usually goes away with appropriate medical treatment of the illness. If prescription medicine is causing depression, talk with your caregiver about stopping the medicine, decreasing the dose, or substituting another medicine. Depression caused by abuse of alcohol or illicit drugs abuse goes away with abstinence from these substances. Some adults need professional help in order to stop drinking or using drugs. SEEK IMMEDIATE CARE IF:  You have thoughts about hurting yourself or others.  You lose touch with reality (have psychotic symptoms).  You are taking medicine for depression and have  a serious side effect. FOR MORE INFORMATION National [employer] on Mental Illness: www.nami.[employer]: https://carter.com/ Document Released: 02/05/2000 Document Revised: 08/09/2011 Document Reviewed: 05/09/2011 [facility] Patient Information 2014 [place].

## 2013-03-21 NOTE — Progress Notes (Signed)
Pre visit review using our clinic review tool, if applicable. No additional management support is needed unless otherwise documented below in the visit note. 

## 2013-03-21 NOTE — Assessment & Plan Note (Signed)
I have asked her to start [facility]

## 2013-03-22 ENCOUNTER — Encounter: Payer: Self-pay | Admitting: Internal Medicine

## 2013-03-24 ENCOUNTER — Encounter: Payer: Self-pay | Admitting: Internal Medicine

## 2013-03-24 NOTE — Progress Notes (Signed)
   Subjective:    Patient ID: Jennifer Mcknight, female    DOB: [birth year], 43 y.o.   MRN: [id number]  HPI Comments: She returns for a physical and complains of s/s of depression (anhedonia, irritability, fatigue, weight gain.)     Review of Systems  Constitutional: Positive for fatigue and unexpected weight change. Negative for fever, chills, diaphoresis and activity change.  HENT: Negative.   Eyes: Negative.   Respiratory: Negative.  Negative for apnea, cough, choking, chest tightness, shortness of breath and stridor.   Cardiovascular: Negative.  Negative for chest pain, palpitations and leg swelling.  Gastrointestinal: Negative.  Negative for nausea, vomiting, abdominal pain, diarrhea, constipation and blood in stool.  Endocrine: Negative.   Genitourinary: Negative.   Musculoskeletal: Negative.   Allergic/Immunologic: Negative.   Neurological: Negative.   Hematological: Negative.  Negative for adenopathy. Does not bruise/bleed easily.  Psychiatric/Behavioral: Positive for dysphoric mood and decreased concentration. Negative for suicidal ideas, hallucinations, behavioral problems, confusion, sleep disturbance, self-injury and agitation. The patient is not nervous/anxious and is not hyperactive.        Objective:   Physical Exam  Vitals reviewed. Constitutional: She is oriented to person, place, and time. She appears well-developed and well-nourished.  HENT:  Head: Normocephalic and atraumatic.  Mouth/Throat: Oropharynx is clear and moist. No oropharyngeal exudate.  Eyes: Conjunctivae are normal. Right eye exhibits no discharge. Left eye exhibits no discharge. No scleral icterus.  Neck: Normal range of motion. Neck supple. No JVD present. No tracheal deviation present. No thyromegaly present.  Cardiovascular: Normal rate, regular rhythm, normal heart sounds and intact distal pulses.  Exam reveals no gallop and no friction rub.   No murmur heard. Pulmonary/Chest: Effort normal and  breath sounds normal. No stridor. No respiratory distress. She has no wheezes. She has no rales. She exhibits no tenderness.  Abdominal: Soft. Bowel sounds are normal. She exhibits no distension and no mass. There is no tenderness. There is no rebound and no guarding.  Musculoskeletal: Normal range of motion. She exhibits no edema and no tenderness.  Lymphadenopathy:    She has no cervical adenopathy.  Neurological: She is oriented to person, place, and time.  Skin: Skin is warm and dry. No rash noted. She is not diaphoretic. No erythema. No pallor.  Psychiatric: Her speech is normal and behavior is normal. Judgment and thought content normal. Her mood appears not anxious. Her affect is angry. Her affect is not blunt, not labile and not inappropriate. Cognition and memory are normal. She exhibits a depressed mood.          Assessment & Plan:

## 2013-03-24 NOTE — Assessment & Plan Note (Signed)
She sees a GYN annually so breast exam and PAP were not done Exam done She refused a flu vax Labs ordered Pt ed material was given

## 2013-04-23 ENCOUNTER — Encounter: Payer: Self-pay | Admitting: Internal Medicine

## 2013-06-03 ENCOUNTER — Telehealth: Payer: Self-pay | Admitting: *Deleted

## 2013-06-03 DIAGNOSIS — F329 Major depressive disorder, single episode, unspecified: Secondary | ICD-10-CM

## 2013-06-03 MED ORDER — VENLAFAXINE HCL ER 37.5 MG PO CP24: 37.5000 mg | ORAL_CAPSULE | Freq: Every day | ORAL | Status: DC

## 2013-06-03 NOTE — Telephone Encounter (Signed)
Pt called to say the she was unable to get the Fetzima 35m because of the cost($220.00).  She stated that the med is not covered by her insurance and the coupon did not help with the cost either.  She stated that she has been without the med since [place] Friday of last week,and since she has not taking it she has had more energy and she feels better.  Pt is wanting to know what the next step is.  Is she to continue on the med or what.  She stated that her depression comes and goes with situations.  Please advise.//AB/CMA

## 2013-06-03 NOTE — Telephone Encounter (Signed)
LMOM @ (11:04am) informing the pt of Dr. Ronnald Ramp note below that recommend she try Efferxor XR 37.67m daily.  Informed her that a new rx has been sent to the pharmacy for her.  Asked the pt to give uKoreaa call back if she has any questions.//AB/CMA

## 2013-06-03 NOTE — Telephone Encounter (Signed)
Try effexor

## 2014-03-26 ENCOUNTER — Encounter: Payer: Self-pay | Admitting: Internal Medicine

## 2014-03-26 ENCOUNTER — Ambulatory Visit (INDEPENDENT_AMBULATORY_CARE_PROVIDER_SITE_OTHER): Payer: BLUE CROSS/BLUE SHIELD | Admitting: Internal Medicine

## 2014-03-26 VITALS — BP 118/76 | HR 69 | Temp 98.3°F | Resp 16 | Ht 67.0 in | Wt 190.0 lb

## 2014-03-26 DIAGNOSIS — F322 Major depressive disorder, single episode, severe without psychotic features: Secondary | ICD-10-CM

## 2014-03-26 DIAGNOSIS — F329 Major depressive disorder, single episode, unspecified: Secondary | ICD-10-CM

## 2014-03-26 MED ORDER — LEVOMILNACIPRAN HCL ER 80 MG PO CP24: 1.0000 | ORAL_CAPSULE | Freq: Every day | ORAL | Status: DC

## 2014-03-26 NOTE — Patient Instructions (Signed)

## 2014-03-26 NOTE — Assessment & Plan Note (Signed)
She has not responded well to agents that affect serotonin in the past, either they were not affective or caused side effects Will try fetzima this time She was given samples, will start at 20 mg per day for 2 days, then will advance to 40 mg per day for 2 weeks, then will maintain at 80 mg per day She is not willing to start psychotherapy

## 2014-03-26 NOTE — Progress Notes (Signed)
   Subjective:    Patient ID: Jennifer Mcknight, female    DOB: 1970/10/30, 44 y.o.   MRN: [id number]  HPI  She returns and complains of worsening s/s of depression, she stopped taking effexor about 8 months ago because it made her feel too sleepy and drowsy. Recently she feels like she is under a lot of stress and complains of bad dreams, anhedonia, fatigue, wt gain, increased appetite, sadness, and irritability.  Review of Systems  Constitutional: Positive for appetite change, fatigue and unexpected weight change. Negative for chills, diaphoresis and activity change.  HENT: Negative.   Eyes: Negative.   Respiratory: Negative.   Cardiovascular: Negative.   Gastrointestinal: Negative.  Negative for abdominal pain.  Endocrine: Negative.   Genitourinary: Negative.   Musculoskeletal: Negative.   Skin: Negative.   Allergic/Immunologic: Negative.   Neurological: Negative.  Negative for dizziness.  Hematological: Negative.   Psychiatric/Behavioral: Positive for dysphoric mood and decreased concentration. Negative for suicidal ideas, hallucinations, behavioral problems, confusion, sleep disturbance, self-injury and agitation. The patient is nervous/anxious. The patient is not hyperactive.        Objective:   Physical Exam  Constitutional:  Non-toxic appearance. She does not have a sickly appearance. She does not appear ill. No distress.  Psychiatric: Her speech is normal and behavior is normal. Thought content normal. Her mood appears not anxious. Her affect is not angry, not blunt, not labile and not inappropriate. She is not agitated, not aggressive, not hyperactive, not slowed, not withdrawn, not actively hallucinating and not combative. Cognition and memory are normal. She exhibits a depressed mood. She expresses no homicidal and no suicidal ideation. She expresses no suicidal plans and no homicidal plans.  She is mildly dysphoric today She is well-kempt and well-groomed She is attentive.           Assessment & Plan:

## 2014-07-30 ENCOUNTER — Other Ambulatory Visit (INDEPENDENT_AMBULATORY_CARE_PROVIDER_SITE_OTHER): Payer: BLUE CROSS/BLUE SHIELD

## 2014-07-30 ENCOUNTER — Ambulatory Visit (INDEPENDENT_AMBULATORY_CARE_PROVIDER_SITE_OTHER): Payer: BLUE CROSS/BLUE SHIELD | Admitting: Internal Medicine

## 2014-07-30 ENCOUNTER — Encounter: Payer: Self-pay | Admitting: Internal Medicine

## 2014-07-30 VITALS — BP 120/84 | HR 101 | Temp 98.8°F | Resp 16 | Ht 67.0 in | Wt 195.0 lb

## 2014-07-30 DIAGNOSIS — G4733 Obstructive sleep apnea (adult) (pediatric): Secondary | ICD-10-CM | POA: Insufficient documentation

## 2014-07-30 DIAGNOSIS — R5383 Other fatigue: Secondary | ICD-10-CM

## 2014-07-30 DIAGNOSIS — M791 Myalgia: Secondary | ICD-10-CM

## 2014-07-30 DIAGNOSIS — IMO0001 Reserved for inherently not codable concepts without codable children: Secondary | ICD-10-CM

## 2014-07-30 DIAGNOSIS — Z1231 Encounter for screening mammogram for malignant neoplasm of breast: Secondary | ICD-10-CM | POA: Insufficient documentation

## 2014-07-30 DIAGNOSIS — M609 Myositis, unspecified: Secondary | ICD-10-CM

## 2014-07-30 DIAGNOSIS — Z0000 Encounter for general adult medical examination without abnormal findings: Secondary | ICD-10-CM | POA: Diagnosis not present

## 2014-07-30 DIAGNOSIS — R0683 Snoring: Secondary | ICD-10-CM

## 2014-07-30 LAB — CBC WITH DIFFERENTIAL/PLATELET
BASOS ABS: 0.1 10*3/uL (ref 0.0–0.1)
Basophils Relative: 0.5 % (ref 0.0–3.0)
Eosinophils Absolute: 0.2 10*3/uL (ref 0.0–0.7)
Eosinophils Relative: 1.8 % (ref 0.0–5.0)
HEMATOCRIT: 45.1 % (ref 36.0–46.0)
Hemoglobin: 15.2 g/dL — ABNORMAL HIGH (ref 12.0–15.0)
LYMPHS ABS: 4.1 10*3/uL — AB (ref 0.7–4.0)
Lymphocytes Relative: 34.5 % (ref 12.0–46.0)
MCHC: 33.8 g/dL (ref 30.0–36.0)
MCV: 87.5 fl (ref 78.0–100.0)
Monocytes Absolute: 0.7 10*3/uL (ref 0.1–1.0)
Monocytes Relative: 5.4 % (ref 3.0–12.0)
Neutro Abs: 6.9 10*3/uL (ref 1.4–7.7)
Neutrophils Relative %: 57.8 % (ref 43.0–77.0)
Platelets: 469 10*3/uL — ABNORMAL HIGH (ref 150.0–400.0)
RBC: 5.15 Mil/uL — ABNORMAL HIGH (ref 3.87–5.11)
RDW: 13 % (ref 11.5–15.5)
WBC: 12 10*3/uL — AB (ref 4.0–10.5)

## 2014-07-30 LAB — COMPREHENSIVE METABOLIC PANEL
ALT: 34 U/L (ref 0–35)
AST: 28 U/L (ref 0–37)
Albumin: 4.6 g/dL (ref 3.5–5.2)
Alkaline Phosphatase: 73 U/L (ref 39–117)
BUN: 10 mg/dL (ref 6–23)
CALCIUM: 9.7 mg/dL (ref 8.4–10.5)
CO2: 28 mEq/L (ref 19–32)
Chloride: 102 mEq/L (ref 96–112)
Creatinine, Ser: 0.67 mg/dL (ref 0.40–1.20)
GFR: 102.28 mL/min (ref >60.00)
Glucose, Bld: 93 mg/dL (ref 70–99)
Potassium: 4.1 mEq/L (ref 3.5–5.1)
SODIUM: 135 meq/L (ref 135–145)
Total Bilirubin: 0.4 mg/dL (ref 0.2–1.2)
Total Protein: 8 g/dL (ref 6.0–8.3)

## 2014-07-30 LAB — T3, FREE: T3, Free: 3.9 pg/mL (ref 2.3–4.2)

## 2014-07-30 LAB — LIPID PANEL
CHOLESTEROL: 239 mg/dL — AB (ref 0–200)
HDL: 57 mg/dL (ref >39.00)
LDL Cholesterol: 151 mg/dL — ABNORMAL HIGH (ref 0–99)
NonHDL: 182
TRIGLYCERIDES: 156 mg/dL — AB (ref 0.0–149.0)
Total CHOL/HDL Ratio: 4
VLDL: 31.2 mg/dL (ref 0.0–40.0)

## 2014-07-30 LAB — TSH: TSH: 2.4 u[IU]/mL (ref 0.35–4.50)

## 2014-07-30 LAB — CK: Total CK: 46 U/L (ref 7–177)

## 2014-07-30 LAB — SEDIMENTATION RATE: Sed Rate: 25 mm/hr — ABNORMAL HIGH (ref 0–22)

## 2014-07-30 LAB — T4: T4 TOTAL: 6.4 ug/dL (ref 4.5–12.0)

## 2014-07-30 NOTE — Progress Notes (Signed)
Pre visit review using our clinic review tool, if applicable. No additional management support is needed unless otherwise documented below in the visit note. 

## 2014-07-30 NOTE — Patient Instructions (Signed)
Preventive Care for Adults A healthy lifestyle and preventive care can promote health and wellness. Preventive health guidelines for women include the following key practices.  A routine yearly physical is a good way to check with your health care provider about your health and preventive screening. It is a chance to share any concerns and updates on your health and to receive a thorough exam.  Visit your dentist for a routine exam and preventive care every 6 months. Brush your teeth twice a day and floss once a day. Good oral hygiene prevents tooth decay and gum disease.  The frequency of eye exams is based on your age, health, family medical history, use of contact lenses, and other factors. Follow your health care provider's recommendations for frequency of eye exams.  Eat a healthy diet. Foods like vegetables, fruits, whole grains, low-fat dairy products, and lean protein foods contain the nutrients you need without too many calories. Decrease your intake of foods high in solid fats, added sugars, and salt. Eat the right amount of calories for you.Get information about a proper diet from your health care provider, if necessary.  Regular physical exercise is one of the most important things you can do for your health. Most adults should get at least 150 minutes of moderate-intensity exercise (any activity that increases your heart rate and causes you to sweat) each week. In addition, most adults need muscle-strengthening exercises on 2 or more days a week.  Maintain a healthy weight. The body mass index (BMI) is a screening tool to identify possible weight problems. It provides an estimate of body fat based on height and weight. Your health care provider can find your BMI and can help you achieve or maintain a healthy weight.For adults 20 years and older:  A BMI below 18.5 is considered underweight.  A BMI of 18.5 to 24.9 is normal.  A BMI of 25 to 29.9 is considered overweight.  A BMI of  30 and above is considered obese.  Maintain normal blood lipids and cholesterol levels by exercising and minimizing your intake of saturated fat. Eat a balanced diet with plenty of fruit and vegetables. Blood tests for lipids and cholesterol should begin at age 76 and be repeated every 5 years. If your lipid or cholesterol levels are high, you are over 50, or you are at high risk for heart disease, you may need your cholesterol levels checked more frequently.Ongoing high lipid and cholesterol levels should be treated with medicines if diet and exercise are not working.  If you smoke, find out from your health care provider how to quit. If you do not use tobacco, do not start.  Lung cancer screening is recommended for adults aged 22-80 years who are at high risk for developing lung cancer because of a history of smoking. A yearly low-dose CT scan of the lungs is recommended for people who have at least a 30-pack-year history of smoking and are a current smoker or have quit within the past 15 years. A pack year of smoking is smoking an average of 1 pack of cigarettes a day for 1 year (for example: 1 pack a day for 30 years or 2 packs a day for 15 years). Yearly screening should continue until the smoker has stopped smoking for at least 15 years. Yearly screening should be stopped for people who develop a health problem that would prevent them from having lung cancer treatment.  If you are pregnant, do not drink alcohol. If you are breastfeeding,  be very cautious about drinking alcohol. If you are not pregnant and choose to drink alcohol, do not have more than 1 drink per day. One drink is considered to be 12 ounces (355 mL) of beer, 5 ounces (148 mL) of wine, or 1.5 ounces (44 mL) of liquor.  Avoid use of street drugs. Do not share needles with anyone. Ask for help if you need support or instructions about stopping the use of drugs.  High blood pressure causes heart disease and increases the risk of  stroke. Your blood pressure should be checked at least every 1 to 2 years. Ongoing high blood pressure should be treated with medicines if weight loss and exercise do not work.  If you are 3-86 years old, ask your health care provider if you should take aspirin to prevent strokes.  Diabetes screening involves taking a blood sample to check your fasting blood sugar level. This should be done once every 3 years, after age 67, if you are within normal weight and without risk factors for diabetes. Testing should be considered at a younger age or be carried out more frequently if you are overweight and have at least 1 risk factor for diabetes.  Breast cancer screening is essential preventive care for women. You should practice "breast self-awareness." This means understanding the normal appearance and feel of your breasts and may include breast self-examination. Any changes detected, no matter how small, should be reported to a health care provider. Women in their 8s and 30s should have a clinical breast exam (CBE) by a health care provider as part of a regular health exam every 1 to 3 years. After age 70, women should have a CBE every year. Starting at age 25, women should consider having a mammogram (breast X-ray test) every year. Women who have a family history of breast cancer should talk to their health care provider about genetic screening. Women at a high risk of breast cancer should talk to their health care providers about having an MRI and a mammogram every year.  Breast cancer gene (BRCA)-related cancer risk assessment is recommended for women who have family members with BRCA-related cancers. BRCA-related cancers include breast, ovarian, tubal, and peritoneal cancers. Having family members with these cancers may be associated with an increased risk for harmful changes (mutations) in the breast cancer genes BRCA1 and BRCA2. Results of the assessment will determine the need for genetic counseling and  BRCA1 and BRCA2 testing.  Routine pelvic exams to screen for cancer are no longer recommended for nonpregnant women who are considered low risk for cancer of the pelvic organs (ovaries, uterus, and vagina) and who do not have symptoms. Ask your health care provider if a screening pelvic exam is right for you.  If you have had past treatment for cervical cancer or a condition that could lead to cancer, you need Pap tests and screening for cancer for at least 20 years after your treatment. If Pap tests have been discontinued, your risk factors (such as having a new sexual partner) need to be reassessed to determine if screening should be resumed. Some women have medical problems that increase the chance of getting cervical cancer. In these cases, your health care provider may recommend more frequent screening and Pap tests.  The HPV test is an additional test that may be used for cervical cancer screening. The HPV test looks for the virus that can cause the cell changes on the cervix. The cells collected during the Pap test can be  tested for HPV. The HPV test could be used to screen women aged 30 years and older, and should be used in women of any age who have unclear Pap test results. After the age of 30, women should have HPV testing at the same frequency as a Pap test.  Colorectal cancer can be detected and often prevented. Most routine colorectal cancer screening begins at the age of 50 years and continues through age 75 years. However, your health care provider may recommend screening at an earlier age if you have risk factors for colon cancer. On a yearly basis, your health care provider may provide home test kits to check for hidden blood in the stool. Use of a small camera at the end of a tube, to directly examine the colon (sigmoidoscopy or colonoscopy), can detect the earliest forms of colorectal cancer. Talk to your health care provider about this at age 50, when routine screening begins. Direct  exam of the colon should be repeated every 5-10 years through age 75 years, unless early forms of pre-cancerous polyps or small growths are found.  People who are at an increased risk for hepatitis B should be screened for this virus. You are considered at high risk for hepatitis B if:  You were born in a country where hepatitis B occurs often. Talk with your health care provider about which countries are considered high risk.  Your parents were born in a high-risk country and you have not received a shot to protect against hepatitis B (hepatitis B vaccine).  You have HIV or AIDS.  You use needles to inject street drugs.  You live with, or have sex with, someone who has hepatitis B.  You get hemodialysis treatment.  You take certain medicines for conditions like cancer, organ transplantation, and autoimmune conditions.  Hepatitis C blood testing is recommended for all people born from 1945 through 1965 and any individual with known risks for hepatitis C.  Practice safe sex. Use condoms and avoid high-risk sexual practices to reduce the spread of sexually transmitted infections (STIs). STIs include gonorrhea, chlamydia, syphilis, trichomonas, herpes, HPV, and human immunodeficiency virus (HIV). Herpes, HIV, and HPV are viral illnesses that have no cure. They can result in disability, cancer, and death.  You should be screened for sexually transmitted illnesses (STIs) including gonorrhea and chlamydia if:  You are sexually active and are younger than 24 years.  You are older than 24 years and your health care provider tells you that you are at risk for this type of infection.  Your sexual activity has changed since you were last screened and you are at an increased risk for chlamydia or gonorrhea. Ask your health care provider if you are at risk.  If you are at risk of being infected with HIV, it is recommended that you take a prescription medicine daily to prevent HIV infection. This is  called preexposure prophylaxis (PrEP). You are considered at risk if:  You are a heterosexual woman, are sexually active, and are at increased risk for HIV infection.  You take drugs by injection.  You are sexually active with a partner who has HIV.  Talk with your health care provider about whether you are at high risk of being infected with HIV. If you choose to begin PrEP, you should first be tested for HIV. You should then be tested every 3 months for as long as you are taking PrEP.  Osteoporosis is a disease in which the bones lose minerals and strength   with aging. This can result in serious bone fractures or breaks. The risk of osteoporosis can be identified using a bone density scan. Women ages 65 years and over and women at risk for fractures or osteoporosis should discuss screening with their health care providers. Ask your health care provider whether you should take a calcium supplement or vitamin D to reduce the rate of osteoporosis.  Menopause can be associated with physical symptoms and risks. Hormone replacement therapy is available to decrease symptoms and risks. You should talk to your health care provider about whether hormone replacement therapy is right for you.  Use sunscreen. Apply sunscreen liberally and repeatedly throughout the day. You should seek shade when your shadow is shorter than you. Protect yourself by wearing long sleeves, pants, a wide-brimmed hat, and sunglasses year round, whenever you are outdoors.  Once a month, do a whole body skin exam, using a mirror to look at the skin on your back. Tell your health care provider of new moles, moles that have irregular borders, moles that are larger than a pencil eraser, or moles that have changed in shape or color.  Stay current with required vaccines (immunizations).  Influenza vaccine. All adults should be immunized every year.  Tetanus, diphtheria, and acellular pertussis (Td, Tdap) vaccine. Pregnant women should  receive 1 dose of Tdap vaccine during each pregnancy. The dose should be obtained regardless of the length of time since the last dose. Immunization is preferred during the 27th-36th week of gestation. An adult who has not previously received Tdap or who does not know her vaccine status should receive 1 dose of Tdap. This initial dose should be followed by tetanus and diphtheria toxoids (Td) booster doses every 10 years. Adults with an unknown or incomplete history of completing a 3-dose immunization series with Td-containing vaccines should begin or complete a primary immunization series including a Tdap dose. Adults should receive a Td booster every 10 years.  Varicella vaccine. An adult without evidence of immunity to varicella should receive 2 doses or a second dose if she has previously received 1 dose. Pregnant females who do not have evidence of immunity should receive the first dose after pregnancy. This first dose should be obtained before leaving the health care facility. The second dose should be obtained 4-8 weeks after the first dose.  Human papillomavirus (HPV) vaccine. Females aged 13-26 years who have not received the vaccine previously should obtain the 3-dose series. The vaccine is not recommended for use in pregnant females. However, pregnancy testing is not needed before receiving a dose. If a female is found to be pregnant after receiving a dose, no treatment is needed. In that case, the remaining doses should be delayed until after the pregnancy. Immunization is recommended for any person with an immunocompromised condition through the age of 26 years if she did not get any or all doses earlier. During the 3-dose series, the second dose should be obtained 4-8 weeks after the first dose. The third dose should be obtained 24 weeks after the first dose and 16 weeks after the second dose.  Zoster vaccine. One dose is recommended for adults aged 60 years or older unless certain conditions are  present.  Measles, mumps, and rubella (MMR) vaccine. Adults born before 1957 generally are considered immune to measles and mumps. Adults born in 1957 or later should have 1 or more doses of MMR vaccine unless there is a contraindication to the vaccine or there is laboratory evidence of immunity to   each of the three diseases. A routine second dose of MMR vaccine should be obtained at least 28 days after the first dose for students attending postsecondary schools, health care workers, or international travelers. People who received inactivated measles vaccine or an unknown type of measles vaccine during 1963-1967 should receive 2 doses of MMR vaccine. People who received inactivated mumps vaccine or an unknown type of mumps vaccine before 1979 and are at high risk for mumps infection should consider immunization with 2 doses of MMR vaccine. For females of childbearing age, rubella immunity should be determined. If there is no evidence of immunity, females who are not pregnant should be vaccinated. If there is no evidence of immunity, females who are pregnant should delay immunization until after pregnancy. Unvaccinated health care workers born before 1957 who lack laboratory evidence of measles, mumps, or rubella immunity or laboratory confirmation of disease should consider measles and mumps immunization with 2 doses of MMR vaccine or rubella immunization with 1 dose of MMR vaccine.  Pneumococcal 13-valent conjugate (PCV13) vaccine. When indicated, a person who is uncertain of her immunization history and has no record of immunization should receive the PCV13 vaccine. An adult aged 19 years or older who has certain medical conditions and has not been previously immunized should receive 1 dose of PCV13 vaccine. This PCV13 should be followed with a dose of pneumococcal polysaccharide (PPSV23) vaccine. The PPSV23 vaccine dose should be obtained at least 8 weeks after the dose of PCV13 vaccine. An adult aged 19  years or older who has certain medical conditions and previously received 1 or more doses of PPSV23 vaccine should receive 1 dose of PCV13. The PCV13 vaccine dose should be obtained 1 or more years after the last PPSV23 vaccine dose.  Pneumococcal polysaccharide (PPSV23) vaccine. When PCV13 is also indicated, PCV13 should be obtained first. All adults aged 65 years and older should be immunized. An adult younger than age 65 years who has certain medical conditions should be immunized. Any person who resides in a nursing home or long-term care facility should be immunized. An adult smoker should be immunized. People with an immunocompromised condition and certain other conditions should receive both PCV13 and PPSV23 vaccines. People with human immunodeficiency virus (HIV) infection should be immunized as soon as possible after diagnosis. Immunization during chemotherapy or radiation therapy should be avoided. Routine use of PPSV23 vaccine is not recommended for American Indians, [place] Natives, or people younger than 65 years unless there are medical conditions that require PPSV23 vaccine. When indicated, people who have unknown immunization and have no record of immunization should receive PPSV23 vaccine. One-time revaccination 5 years after the first dose of PPSV23 is recommended for people aged 19-64 years who have chronic kidney failure, nephrotic syndrome, asplenia, or immunocompromised conditions. People who received 1-2 doses of PPSV23 before age 65 years should receive another dose of PPSV23 vaccine at age 65 years or later if at least 5 years have passed since the previous dose. Doses of PPSV23 are not needed for people immunized with PPSV23 at or after age 65 years.  Meningococcal vaccine. Adults with asplenia or persistent complement component deficiencies should receive 2 doses of quadrivalent meningococcal conjugate (MenACWY-D) vaccine. The doses should be obtained at least 2 months apart.  Microbiologists working with certain meningococcal bacteria, military recruits, people at risk during an outbreak, and people who travel to or live in countries with a high rate of meningitis should be immunized. A first-year college student up through age   21 years who is living in a residence hall should receive a dose if she did not receive a dose on or after her 16th birthday. Adults who have certain high-risk conditions should receive one or more doses of vaccine.  Hepatitis A vaccine. Adults who wish to be protected from this disease, have certain high-risk conditions, work with hepatitis A-infected animals, work in hepatitis A research labs, or travel to or work in countries with a high rate of hepatitis A should be immunized. Adults who were previously unvaccinated and who anticipate close contact with an international adoptee during the first 60 days after arrival in the [place] from a country with a high rate of hepatitis A should be immunized.  Hepatitis B vaccine. Adults who wish to be protected from this disease, have certain high-risk conditions, may be exposed to blood or other infectious body fluids, are household contacts or sex partners of hepatitis B positive people, are clients or workers in certain care facilities, or travel to or work in countries with a high rate of hepatitis B should be immunized.  Haemophilus influenzae type b (Hib) vaccine. A previously unvaccinated person with asplenia or sickle cell disease or having a scheduled splenectomy should receive 1 dose of Hib vaccine. Regardless of previous immunization, a recipient of a hematopoietic stem cell transplant should receive a 3-dose series 6-12 months after her successful transplant. Hib vaccine is not recommended for adults with HIV infection. Preventive Services / Frequency Ages 64 to 68 years  Blood pressure check.** / Every 1 to 2 years.  Lipid and cholesterol check.** / Every 5 years beginning at age  22.  Clinical breast exam.** / Every 3 years for women in their 88s and 53s.  BRCA-related cancer risk assessment.** / For women who have family members with a BRCA-related cancer (breast, ovarian, tubal, or peritoneal cancers).  Pap test.** / Every 2 years from ages 90 through 51. Every 3 years starting at age 21 through age 56 or 3 with a history of 3 consecutive normal Pap tests.  HPV screening.** / Every 3 years from ages 24 through ages 1 to 46 with a history of 3 consecutive normal Pap tests.  Hepatitis C blood test.** / For any individual with known risks for hepatitis C.  Skin self-exam. / Monthly.  Influenza vaccine. / Every year.  Tetanus, diphtheria, and acellular pertussis (Tdap, Td) vaccine.** / Consult your health care provider. Pregnant women should receive 1 dose of Tdap vaccine during each pregnancy. 1 dose of Td every 10 years.  Varicella vaccine.** / Consult your health care provider. Pregnant females who do not have evidence of immunity should receive the first dose after pregnancy.  HPV vaccine. / 3 doses over 6 months, if 72 and younger. The vaccine is not recommended for use in pregnant females. However, pregnancy testing is not needed before receiving a dose.  Measles, mumps, rubella (MMR) vaccine.** / You need at least 1 dose of MMR if you were born in 1957 or later. You may also need a 2nd dose. For females of childbearing age, rubella immunity should be determined. If there is no evidence of immunity, females who are not pregnant should be vaccinated. If there is no evidence of immunity, females who are pregnant should delay immunization until after pregnancy.  Pneumococcal 13-valent conjugate (PCV13) vaccine.** / Consult your health care provider.  Pneumococcal polysaccharide (PPSV23) vaccine.** / 1 to 2 doses if you smoke cigarettes or if you have certain conditions.  Meningococcal vaccine.** /  1 dose if you are age 19 to 21 years and a first-year college  student living in a residence hall, or have one of several medical conditions, you need to get vaccinated against meningococcal disease. You may also need additional booster doses.  Hepatitis A vaccine.** / Consult your health care provider.  Hepatitis B vaccine.** / Consult your health care provider.  Haemophilus influenzae type b (Hib) vaccine.** / Consult your health care provider. Ages 40 to 64 years  Blood pressure check.** / Every 1 to 2 years.  Lipid and cholesterol check.** / Every 5 years beginning at age 20 years.  Lung cancer screening. / Every year if you are aged 55-80 years and have a 30-pack-year history of smoking and currently smoke or have quit within the past 15 years. Yearly screening is stopped once you have quit smoking for at least 15 years or develop a health problem that would prevent you from having lung cancer treatment.  Clinical breast exam.** / Every year after age 40 years.  BRCA-related cancer risk assessment.** / For women who have family members with a BRCA-related cancer (breast, ovarian, tubal, or peritoneal cancers).  Mammogram.** / Every year beginning at age 40 years and continuing for as long as you are in good health. Consult with your health care provider.  Pap test.** / Every 3 years starting at age 30 years through age 65 or 70 years with a history of 3 consecutive normal Pap tests.  HPV screening.** / Every 3 years from ages 30 years through ages 65 to 70 years with a history of 3 consecutive normal Pap tests.  Fecal occult blood test (FOBT) of stool. / Every year beginning at age 50 years and continuing until age 75 years. You may not need to do this test if you get a colonoscopy every 10 years.  Flexible sigmoidoscopy or colonoscopy.** / Every 5 years for a flexible sigmoidoscopy or every 10 years for a colonoscopy beginning at age 50 years and continuing until age 75 years.  Hepatitis C blood test.** / For all people born from 1945 through  1965 and any individual with known risks for hepatitis C.  Skin self-exam. / Monthly.  Influenza vaccine. / Every year.  Tetanus, diphtheria, and acellular pertussis (Tdap/Td) vaccine.** / Consult your health care provider. Pregnant women should receive 1 dose of Tdap vaccine during each pregnancy. 1 dose of Td every 10 years.  Varicella vaccine.** / Consult your health care provider. Pregnant females who do not have evidence of immunity should receive the first dose after pregnancy.  Zoster vaccine.** / 1 dose for adults aged 60 years or older.  Measles, mumps, rubella (MMR) vaccine.** / You need at least 1 dose of MMR if you were born in 1957 or later. You may also need a 2nd dose. For females of childbearing age, rubella immunity should be determined. If there is no evidence of immunity, females who are not pregnant should be vaccinated. If there is no evidence of immunity, females who are pregnant should delay immunization until after pregnancy.  Pneumococcal 13-valent conjugate (PCV13) vaccine.** / Consult your health care provider.  Pneumococcal polysaccharide (PPSV23) vaccine.** / 1 to 2 doses if you smoke cigarettes or if you have certain conditions.  Meningococcal vaccine.** / Consult your health care provider.  Hepatitis A vaccine.** / Consult your health care provider.  Hepatitis B vaccine.** / Consult your health care provider.  Haemophilus influenzae type b (Hib) vaccine.** / Consult your health care provider. Ages 65   years and over  Blood pressure check.** / Every 1 to 2 years.  Lipid and cholesterol check.** / Every 5 years beginning at age 22 years.  Lung cancer screening. / Every year if you are aged 73-80 years and have a 30-pack-year history of smoking and currently smoke or have quit within the past 15 years. Yearly screening is stopped once you have quit smoking for at least 15 years or develop a health problem that would prevent you from having lung cancer  treatment.  Clinical breast exam.** / Every year after age 4 years.  BRCA-related cancer risk assessment.** / For women who have family members with a BRCA-related cancer (breast, ovarian, tubal, or peritoneal cancers).  Mammogram.** / Every year beginning at age 40 years and continuing for as long as you are in good health. Consult with your health care provider.  Pap test.** / Every 3 years starting at age 9 years through age 34 or 91 years with 3 consecutive normal Pap tests. Testing can be stopped between 65 and 70 years with 3 consecutive normal Pap tests and no abnormal Pap or HPV tests in the past 10 years.  HPV screening.** / Every 3 years from ages 57 years through ages 64 or 45 years with a history of 3 consecutive normal Pap tests. Testing can be stopped between 65 and 70 years with 3 consecutive normal Pap tests and no abnormal Pap or HPV tests in the past 10 years.  Fecal occult blood test (FOBT) of stool. / Every year beginning at age 15 years and continuing until age 17 years. You may not need to do this test if you get a colonoscopy every 10 years.  Flexible sigmoidoscopy or colonoscopy.** / Every 5 years for a flexible sigmoidoscopy or every 10 years for a colonoscopy beginning at age 86 years and continuing until age 71 years.  Hepatitis C blood test.** / For all people born from [birth year] through 1964 and any individual with known risks for hepatitis C.  Osteoporosis screening.** / A one-time screening for women ages 83 years and over and women at risk for fractures or osteoporosis.  Skin self-exam. / Monthly.  Influenza vaccine. / Every year.  Tetanus, diphtheria, and acellular pertussis (Tdap/Td) vaccine.** / 1 dose of Td every 10 years.  Varicella vaccine.** / Consult your health care provider.  Zoster vaccine.** / 1 dose for adults aged 61 years or older.  Pneumococcal 13-valent conjugate (PCV13) vaccine.** / Consult your health care provider.  Pneumococcal  polysaccharide (PPSV23) vaccine.** / 1 dose for all adults aged 28 years and older.  Meningococcal vaccine.** / Consult your health care provider.  Hepatitis A vaccine.** / Consult your health care provider.  Hepatitis B vaccine.** / Consult your health care provider.  Haemophilus influenzae type b (Hib) vaccine.** / Consult your health care provider. ** Family history and personal history of risk and conditions may change your health care provider's recommendations. Document Released: 04/05/2001 Document Revised: 06/24/2013 Document Reviewed: 07/05/2010 [facility] Patient Information 2015 [place], [place]. This information is not intended to replace advice given to you by your health care provider. Make sure you discuss any questions you have with your health care provider.

## 2014-07-30 NOTE — Progress Notes (Signed)
Subjective:  Patient ID: Jennifer Mcknight, female    DOB: 1971/01/27  Age: 44 y.o. MRN: [id number]  CC: Depression and Annual Exam   HPI Justine Dines presents for CPX but she also complains of snoring, fatigue, myalgias, wt gain, feeling cold, and diffuse aching for several months. She has not taken anything for these symptoms.  Outpatient Prescriptions Prior to Visit  Medication Sig Dispense Refill  . Levomilnacipran HCl ER (FETZIMA) 80 MG CP24 Take 1 capsule by mouth daily. 30 capsule 11   No facility-administered medications prior to visit.    ROS Review of Systems  Constitutional: Positive for fatigue and unexpected weight change. Negative for fever, chills, diaphoresis, activity change and appetite change.  HENT: Negative.  Negative for sore throat, trouble swallowing and voice change.   Eyes: Negative.   Respiratory: Negative.  Negative for cough, choking, chest tightness, shortness of breath and stridor.   Cardiovascular: Negative.  Negative for chest pain, palpitations and leg swelling.  Gastrointestinal: Negative.  Negative for nausea, vomiting, abdominal pain, diarrhea, constipation and blood in stool.  Endocrine: Negative.   Genitourinary: Negative.  Negative for difficulty urinating.  Musculoskeletal: Positive for myalgias and arthralgias. Negative for back pain, joint swelling, gait problem, neck pain and neck stiffness.  Skin: Negative.  Negative for rash.  Allergic/Immunologic: Negative.   Neurological: Negative.  Negative for dizziness, tremors, speech difficulty, weakness, light-headedness and numbness.  Hematological: Negative.  Negative for adenopathy. Does not bruise/bleed easily.  Psychiatric/Behavioral: Negative.  Negative for suicidal ideas, confusion, sleep disturbance, dysphoric mood and decreased concentration. The patient is not nervous/anxious and is not hyperactive.     Objective:  BP 120/84 mmHg  Pulse 101  Temp(Src) 98.8 F (37.1 C) (Oral)  Resp 16   Ht 5' 7"  (1.702 m)  Wt 195 lb (88.451 kg)  BMI 30.53 kg/m2  SpO2 96%  LMP 07/22/2014  BP Readings from Last 3 Encounters:  07/30/14 120/84  03/26/14 118/76  03/21/13 120/84    Wt Readings from Last 3 Encounters:  07/30/14 195 lb (88.451 kg)  03/26/14 190 lb (86.183 kg)  03/21/13 176 lb (79.833 kg)    Physical Exam  Constitutional: She is oriented to person, place, and time. She appears well-developed and well-nourished. No distress.  HENT:  Head: Normocephalic and atraumatic.  Mouth/Throat: Oropharynx is clear and moist. No oropharyngeal exudate.  Eyes: Conjunctivae are normal. Right eye exhibits no discharge. Left eye exhibits no discharge. No scleral icterus.  Neck: Normal range of motion. Neck supple. No JVD present. No tracheal deviation present. No thyromegaly present.  Cardiovascular: Normal rate, regular rhythm, normal heart sounds and intact distal pulses.  Exam reveals no gallop and no friction rub.   No murmur heard. Pulmonary/Chest: Effort normal and breath sounds normal. No stridor. No respiratory distress. She has no wheezes. She has no rales. She exhibits no tenderness.  Abdominal: Soft. Bowel sounds are normal. She exhibits no distension and no mass. There is no tenderness. There is no rebound and no guarding.  Musculoskeletal: Normal range of motion. She exhibits no edema or tenderness.  Lymphadenopathy:    She has no cervical adenopathy.  Neurological: She is oriented to person, place, and time.  Skin: Skin is warm and dry. No rash noted. She is not diaphoretic. No erythema. No pallor.  Psychiatric: She has a normal mood and affect. Her behavior is normal. Judgment and thought content normal.  Vitals reviewed.   Lab Results  Component Value Date   WBC 12.0* 07/30/2014  HGB 15.2* 07/30/2014   HCT 45.1 07/30/2014   PLT 469.0* 07/30/2014   GLUCOSE 93 07/30/2014   CHOL 239* 07/30/2014   TRIG 156.0* 07/30/2014   HDL 57.00 07/30/2014   LDLDIRECT 123.1  03/21/2013   LDLCALC 151* 07/30/2014   ALT 34 07/30/2014   AST 28 07/30/2014   NA 135 07/30/2014   K 4.1 07/30/2014   CL 102 07/30/2014   CREATININE 0.67 07/30/2014   BUN 10 07/30/2014   CO2 28 07/30/2014   TSH 2.40 07/30/2014    No results found.  Assessment & Plan:   Ivi was seen today for depression and annual exam.  Diagnoses and all orders for this visit:  Snoring - I am concerned that OSA may be causing her symptoms, I have asked her to see sleep medicine  Orders: -     Ambulatory referral to Pulmonology  Myalgia and myositis - will check her labs to screen for myopathy and inflammation Orders: -     CK; Future -     Sedimentation rate; Future  Other fatigue - I have asked her to be screened for OSA, will check her labs to look for other secondary causes Orders: -     T4; Future -     T3, free; Future  Routine general medical examination at a health care facility - exam done, vaccines were reviewed, she was referred for a mammogram, labs ordered Orders: -     Comprehensive metabolic panel; Future -     CBC with Differential/Platelet; Future -     TSH; Future -     Lipid panel; Future  Visit for screening mammogram Orders: -     MM DIGITAL SCREENING BILATERAL; Future  I am having Ms. Scheeler maintain her Levomilnacipran HCl ER.  No orders of the defined types were placed in this encounter.     Follow-up: Return in about 4 months (around 11/29/2014).  Scarlette Calico, MD

## 2014-07-31 ENCOUNTER — Encounter: Payer: Self-pay | Admitting: Internal Medicine

## 2014-10-20 ENCOUNTER — Telehealth: Payer: Self-pay | Admitting: Internal Medicine

## 2014-10-20 DIAGNOSIS — R0683 Snoring: Secondary | ICD-10-CM

## 2014-10-20 NOTE — Telephone Encounter (Signed)
Patient need another referral for sleep study put in because the other expired. Please advise

## 2014-10-21 NOTE — Telephone Encounter (Signed)
done

## 2014-10-21 NOTE — Telephone Encounter (Signed)
Please advise 

## 2014-12-17 ENCOUNTER — Institutional Professional Consult (permissible substitution): Payer: BLUE CROSS/BLUE SHIELD | Admitting: Pulmonary Disease

## 2015-04-08 ENCOUNTER — Other Ambulatory Visit: Payer: Self-pay | Admitting: Internal Medicine

## 2015-05-29 DIAGNOSIS — Z6833 Body mass index (BMI) 33.0-33.9, adult: Secondary | ICD-10-CM | POA: Diagnosis not present

## 2015-05-29 DIAGNOSIS — R635 Abnormal weight gain: Secondary | ICD-10-CM | POA: Diagnosis not present

## 2015-05-29 DIAGNOSIS — E6609 Other obesity due to excess calories: Secondary | ICD-10-CM | POA: Diagnosis not present

## 2015-07-14 DIAGNOSIS — Z79899 Other long term (current) drug therapy: Secondary | ICD-10-CM | POA: Diagnosis not present

## 2015-07-14 DIAGNOSIS — G4733 Obstructive sleep apnea (adult) (pediatric): Secondary | ICD-10-CM | POA: Diagnosis not present

## 2015-07-14 DIAGNOSIS — E669 Obesity, unspecified: Secondary | ICD-10-CM | POA: Diagnosis not present

## 2015-07-14 DIAGNOSIS — Z6832 Body mass index (BMI) 32.0-32.9, adult: Secondary | ICD-10-CM | POA: Diagnosis not present

## 2015-10-14 ENCOUNTER — Other Ambulatory Visit: Payer: Self-pay | Admitting: Internal Medicine

## 2016-04-14 ENCOUNTER — Encounter: Payer: Self-pay | Admitting: Internal Medicine

## 2016-04-14 ENCOUNTER — Other Ambulatory Visit (INDEPENDENT_AMBULATORY_CARE_PROVIDER_SITE_OTHER): Payer: Self-pay

## 2016-04-14 ENCOUNTER — Ambulatory Visit (INDEPENDENT_AMBULATORY_CARE_PROVIDER_SITE_OTHER): Payer: Self-pay | Admitting: Internal Medicine

## 2016-04-14 VITALS — BP 132/82 | HR 100 | Temp 97.8°F | Resp 16 | Ht 67.0 in | Wt 198.5 lb

## 2016-04-14 DIAGNOSIS — R5383 Other fatigue: Secondary | ICD-10-CM

## 2016-04-14 DIAGNOSIS — G4733 Obstructive sleep apnea (adult) (pediatric): Secondary | ICD-10-CM

## 2016-04-14 DIAGNOSIS — R0609 Other forms of dyspnea: Secondary | ICD-10-CM | POA: Insufficient documentation

## 2016-04-14 LAB — CBC WITH DIFFERENTIAL/PLATELET
BASOS ABS: 0.1 10*3/uL (ref 0.0–0.1)
BASOS PCT: 0.8 % (ref 0.0–3.0)
EOS ABS: 0.5 10*3/uL (ref 0.0–0.7)
Eosinophils Relative: 3.4 % (ref 0.0–5.0)
HEMATOCRIT: 43.3 % (ref 36.0–46.0)
HEMOGLOBIN: 14.3 g/dL (ref 12.0–15.0)
LYMPHS PCT: 37.4 % (ref 12.0–46.0)
Lymphs Abs: 4.9 10*3/uL — ABNORMAL HIGH (ref 0.7–4.0)
MCHC: 33.1 g/dL (ref 30.0–36.0)
MCV: 86.7 fl (ref 78.0–100.0)
MONOS PCT: 5.6 % (ref 3.0–12.0)
Monocytes Absolute: 0.7 10*3/uL (ref 0.1–1.0)
NEUTROS ABS: 6.9 10*3/uL (ref 1.4–7.7)
Neutrophils Relative %: 52.8 % (ref 43.0–77.0)
PLATELETS: 486 10*3/uL — AB (ref 150.0–400.0)
RBC: 5 Mil/uL (ref 3.87–5.11)
RDW: 13.4 % (ref 11.5–15.5)
WBC: 13.2 10*3/uL — AB (ref 4.0–10.5)

## 2016-04-14 LAB — COMPREHENSIVE METABOLIC PANEL
ALBUMIN: 4.5 g/dL (ref 3.5–5.2)
ALT: 34 U/L (ref 0–35)
AST: 27 U/L (ref 0–37)
Alkaline Phosphatase: 69 U/L (ref 39–117)
BUN: 12 mg/dL (ref 6–23)
CALCIUM: 9.6 mg/dL (ref 8.4–10.5)
CHLORIDE: 104 meq/L (ref 96–112)
CO2: 27 meq/L (ref 19–32)
CREATININE: 0.71 mg/dL (ref 0.40–1.20)
GFR: 94.9 mL/min (ref >60.00)
Glucose, Bld: 83 mg/dL (ref 70–99)
Potassium: 4 mEq/L (ref 3.5–5.1)
Sodium: 137 mEq/L (ref 135–145)
Total Bilirubin: 0.3 mg/dL (ref 0.2–1.2)
Total Protein: 7.6 g/dL (ref 6.0–8.3)

## 2016-04-14 NOTE — Progress Notes (Signed)
Pre visit review using our clinic review tool, if applicable. No additional management support is needed unless otherwise documented below in the visit note. 

## 2016-04-14 NOTE — Progress Notes (Signed)
Subjective:  Patient ID: Jennifer Mcknight, female    DOB: May 17, 1970  Age: 46 y.o. MRN: [id number]  CC: No chief complaint on file.   HPI Jennifer Mcknight presents for concerns about chronic fatigue, weight gain, DOE, and heavy snoring. Her daughter sleep near her and complains about her snoring. Several years ago she underwent a sleep study at [facility] and was told that she had sleep apnea. She never went back for CPAP titration and device fitting. She can sleep about 10 hours but the sleep is not restorative. She does not have a bed partner so there is no one to document apnea.  Outpatient Medications Prior to Visit  Medication Sig Dispense Refill  . FETZIMA 80 MG CP24 TAKE ONE CAPSULE BY MOUTH EVERY DAY 30 capsule 11   No facility-administered medications prior to visit.     ROS Review of Systems  Constitutional: Positive for fatigue and unexpected weight change. Negative for diaphoresis.  HENT: Negative.   Eyes: Negative.   Respiratory: Positive for shortness of breath. Negative for chest tightness, wheezing and stridor.   Cardiovascular: Negative for chest pain, palpitations and leg swelling.  Gastrointestinal: Negative.  Negative for abdominal pain, constipation, diarrhea, nausea and vomiting.  Endocrine: Negative.  Negative for cold intolerance and heat intolerance.  Genitourinary: Negative.  Negative for difficulty urinating.  Musculoskeletal: Negative for back pain, myalgias and neck pain.  Skin: Negative.  Negative for color change and rash.  Allergic/Immunologic: Negative.   Neurological: Negative.  Negative for dizziness, weakness, light-headedness and numbness.  Hematological: Negative.  Negative for adenopathy. Does not bruise/bleed easily.  Psychiatric/Behavioral: Negative.  Negative for behavioral problems, decreased concentration, dysphoric mood and sleep disturbance. The patient is not nervous/anxious.     Objective:  BP 132/82 (BP Location: Left Arm, Patient  Position: Sitting, Cuff Size: Large)   Pulse 100   Temp 97.8 F (36.6 C) (Oral)   Ht 5' 7"  (1.702 m)   Wt 198 lb 8 oz (90 kg)   SpO2 97%   BMI 31.09 kg/m   BP Readings from Last 3 Encounters:  04/14/16 132/82  07/30/14 120/84  03/26/14 118/76    Wt Readings from Last 3 Encounters:  04/14/16 198 lb 8 oz (90 kg)  07/30/14 195 lb (88.5 kg)  03/26/14 190 lb (86.2 kg)    Physical Exam  Constitutional: She is oriented to person, place, and time. No distress.  HENT:  Mouth/Throat: Oropharynx is clear and moist. No oropharyngeal exudate.  Eyes: Conjunctivae are normal. Right eye exhibits no discharge. Left eye exhibits no discharge. No scleral icterus.  Neck: Normal range of motion. Neck supple. No JVD present. No tracheal deviation present. No thyromegaly present.  Cardiovascular: Normal rate, regular rhythm, normal heart sounds and intact distal pulses.  Exam reveals no gallop and no friction rub.   No murmur heard. EKG ---  Sinus  Rhythm  WITHIN NORMAL LIMITS  Pulmonary/Chest: Effort normal and breath sounds normal. No stridor. No respiratory distress. She has no wheezes. She has no rales. She exhibits no tenderness.  Abdominal: Soft. Bowel sounds are normal. She exhibits no distension. There is no tenderness. There is no rebound and no guarding.  Musculoskeletal: Normal range of motion. She exhibits no edema, tenderness or deformity.  Lymphadenopathy:    She has no cervical adenopathy.  Neurological: She is oriented to person, place, and time.  Skin: Skin is warm and dry. No rash noted. She is not diaphoretic. No erythema. No pallor.  Psychiatric:  She has a normal mood and affect. Her behavior is normal. Judgment and thought content normal.  Vitals reviewed.   Lab Results  Component Value Date   WBC 12.0 (H) 07/30/2014   HGB 15.2 (H) 07/30/2014   HCT 45.1 07/30/2014   PLT 469.0 (H) 07/30/2014   GLUCOSE 93 07/30/2014   CHOL 239 (H) 07/30/2014   TRIG 156.0 (H)  07/30/2014   HDL 57.00 07/30/2014   LDLDIRECT 123.1 03/21/2013   LDLCALC 151 (H) 07/30/2014   ALT 34 07/30/2014   AST 28 07/30/2014   NA 135 07/30/2014   K 4.1 07/30/2014   CL 102 07/30/2014   CREATININE 0.67 07/30/2014   BUN 10 07/30/2014   CO2 28 07/30/2014   TSH 2.40 07/30/2014    No results found.  Assessment & Plan:   Diagnoses and all orders for this visit:  OSA (obstructive sleep apnea)- I think this is the cause for her symptoms. I've asked her to be seen by sleep medicine to consider being fitted for a CPAP machine. -     Ambulatory referral to Sleep Studies  Other fatigue- her labs are negative for any secondary or metabolic causes of fatigue. I don't think the antidepressant Terie Purser is causing her symptoms. -     Comprehensive metabolic panel; Future -     CBC with Differential/Platelet; Future -     Thyroid Panel With TSH; Future  DOE (dyspnea on exertion)- her EKG is normal and other than DOE she doesn't have any cardiovascular symptoms so I don't think her symptoms are related to cardiovascular disease. I think her DOE is related to poor conditioning, obesity, and sleep apnea. -     EKG 12-Lead   I am having Jennifer Mcknight maintain her [place].  No orders of the defined types were placed in this encounter.    Follow-up: No Follow-up on file.  Jennifer Calico, MD

## 2016-04-14 NOTE — Patient Instructions (Signed)
Fatigue Introduction Fatigue is feeling tired all of the time, a lack of energy, or a lack of motivation. Occasional or mild fatigue is often a normal response to activity or life in general. However, long-lasting (chronic) or extreme fatigue may indicate an underlying medical condition. Follow these instructions at home: Watch your fatigue for any changes. The following actions may help to lessen any discomfort you are feeling:  Talk to your health care provider about how much sleep you need each night. Try to get the required amount every night.  Take medicines only as directed by your health care provider.  Eat a healthy and nutritious diet. Ask your health care provider if you need help changing your diet.  Drink enough fluid to keep your urine clear or pale yellow.  Practice ways of relaxing, such as yoga, meditation, massage therapy, or acupuncture.  Exercise regularly.  Change situations that cause you stress. Try to keep your work and personal routine reasonable.  Do not abuse illegal drugs.  Limit alcohol intake to no more than 1 drink per day for nonpregnant women and 2 drinks per day for men. One drink equals 12 ounces of beer, 5 ounces of wine, or 1 ounces of hard liquor.  Take a multivitamin, if directed by your health care provider. Contact a health care provider if:  Your fatigue does not get better.  You have a fever.  You have unintentional weight loss or gain.  You have headaches.  You have difficulty:  Falling asleep.  Sleeping throughout the night.  You feel angry, guilty, anxious, or sad.  You are unable to have a bowel movement (constipation).  You skin is dry.  Your legs or another part of your body is swollen. Get help right away if:  You feel confused.  Your vision is blurry.  You feel faint or pass out.  You have a severe headache.  You have severe abdominal, pelvic, or back pain.  You have chest pain, shortness of breath, or an  irregular or fast heartbeat.  You are unable to urinate or you urinate less than normal.  You develop abnormal bleeding, such as bleeding from the rectum, vagina, nose, lungs, or nipples.  You vomit blood.  You have thoughts about harming yourself or committing suicide.  You are worried that you might harm someone else. This information is not intended to replace advice given to you by your health care provider. Make sure you discuss any questions you have with your health care provider. Document Released: 12/05/2006 Document Revised: 07/16/2015 Document Reviewed: 06/11/2013  2017 [employer]

## 2016-04-15 ENCOUNTER — Encounter: Payer: Self-pay | Admitting: Internal Medicine

## 2016-04-15 LAB — THYROID PANEL WITH TSH
FREE THYROXINE INDEX: 1.8 (ref 1.4–3.8)
T3 UPTAKE: 25 % (ref 22–35)
T4, Total: 7.3 ug/dL (ref 4.5–12.0)
TSH: 1.99 mIU/L

## 2016-04-21 ENCOUNTER — Ambulatory Visit (INDEPENDENT_AMBULATORY_CARE_PROVIDER_SITE_OTHER): Payer: Self-pay | Admitting: Neurology

## 2016-04-21 ENCOUNTER — Encounter: Payer: Self-pay | Admitting: Neurology

## 2016-04-21 VITALS — BP 147/93 | HR 97 | Resp 20 | Ht 67.0 in | Wt 198.0 lb

## 2016-04-21 DIAGNOSIS — G4710 Hypersomnia, unspecified: Secondary | ICD-10-CM

## 2016-04-21 DIAGNOSIS — G4733 Obstructive sleep apnea (adult) (pediatric): Secondary | ICD-10-CM

## 2016-04-21 DIAGNOSIS — R351 Nocturia: Secondary | ICD-10-CM

## 2016-04-21 DIAGNOSIS — E669 Obesity, unspecified: Secondary | ICD-10-CM

## 2016-04-21 DIAGNOSIS — G4730 Sleep apnea, unspecified: Secondary | ICD-10-CM

## 2016-04-21 DIAGNOSIS — R635 Abnormal weight gain: Secondary | ICD-10-CM

## 2016-04-21 NOTE — Progress Notes (Addendum)
Subjective:    Patient ID: Jennifer Mcknight is a 46 y.o. female.  HPI    Star Age, MD, PhD [facility] [address] [employer] [place] [employer], [place] [address]  Dear Dr. Ronnald Ramp,   I saw your patient, Jennifer Mcknight, upon your kind request in my neurologic clinic today for initial consultation of her sleep disorder, in particular reevaluation of her OSA. The patient is unaccompanied today. As you know, Jennifer Mcknight is a 46 year old right-handed woman with an underlying medical history of obesity, and anxiety, who was previously diagnosed with obstructive sleep apnea about a year ago with a sleep study. Prior sleep study results are not available for my review. I reviewed your office note from 04/14/2016. She was never treated with CPAP. She reports snoring and excessive daytime somnolence, and difficulty with her weight management. In fact, she started seeing weight management at [facility] and had some modest amount of weight loss but not sustained and in fact she has been gaining weight. She had the sleep study in the context of her weight management appointment. She does not have results her self but was told that she had moderate obstructive sleep apnea and that treatment with CPAP would be the next step. She did not have insurance in the interim but does not want to wait until she has insurance because she continues to feel bad and in fact she feels worse with respect to her daytime somnolence as well as her weight gain. Her Epworth sleepiness score is 15 out of 24, fatigue score is 54 out of 63. She limits her caffeine to 2 servings per day in the form of coffee, tea or soda. She tries to be in bed around 10 and wake up time is 6:30 but she does not wake up rested. She denies morning headaches but has nocturia once per night, almost always at 3 AM. She snores loudly. Her husband is a restless sleeper as well. She does not endorse any restless leg symptoms or leg twitching at  night. She is not aware of any family history of OSA. 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 She has 2 children, a 22 year old daughter and 23 year old son. She often takes a nap before picking her son up from school. She quit smoking about a year ago. She drinks alcohol about every other week or so. She had sinus surgery in her 9s and her uvula was trimmed at the time.  We looked in her care everywhere chart and it appears that she had a sleep study on 07/15/2015 but results are not visible for [place]. We will try to get records from [facility] and she will also look at home. She has been on Fetzima for about 3 years, it has helped her mood.  04/21/2016, 1 PM: I reviewed sleep study results from 07/14/2015. Sleep efficiency was 80.4%, sleep latency was 68.5 minutes. Wake after sleep onset was 37 minutes, percentage of stage I was normal, percentage of stage II was normal, stage III was 24.7% and REM stage was 27.8%, mildly increased, REM latency 96 minutes, normal. Arousal index was mildly elevated at 19.7 per hour. Total AHI was in the mild range at 6.0 per hour, REM AHI was 4.7 per hour, supine AHI was 2.7 per hour, average oxygen saturation was 94%, nadir was 89%. PLM index was 12.8, PLM arousal index was 6.6 per hour. Patient overall had mild obstructive sleep apnea.  Her Past Medical  History Is Significant For: Past Medical History:  Diagnosis Date  . Depression     Her Past Surgical History Is Significant For: Past Surgical History:  Procedure Laterality Date  . CESAREAN SECTION    . NASAL SINUS SURGERY    . TUBAL LIGATION      Her Family History Is Significant For: Family History  Problem Relation Age of Onset  . Alcohol abuse Other   . Drug abuse Other   . Depression Other   . Diabetes Father   . Depression Father   . Cancer Neg Hx   . Heart disease Neg Hx   . Early death Neg Hx   . Hyperlipidemia Neg Hx   . Hypertension Neg Hx   . Kidney disease Neg  Hx   . Learning disabilities Neg Hx   . Stroke Neg Hx     Her Social History Is Significant For: Social History   Social History  . Marital status: Married    Spouse name: N/A  . Number of children: N/A  . Years of education: N/A   Social History Main Topics  . Smoking status: Never Smoker  . Smokeless tobacco: Never Used  . Alcohol use No  . Drug use: No  . Sexual activity: Yes    Birth control/ protection: Surgical   Other Topics Concern  . None   Social History Narrative  . None    Her Allergies Are:  No Known Allergies:   Her Current Medications Are:  Outpatient Encounter Prescriptions as of 04/21/2016  Medication Sig  . [place] 80 MG CP24 TAKE ONE CAPSULE BY MOUTH EVERY DAY   No facility-administered encounter medications on file as of 04/21/2016.   :  Review of Systems:  Out of a complete 14 point review of systems, all are reviewed and negative with the exception of these symptoms as listed below: Review of Systems  Neurological: Positive for headaches.       Pt presents today to discuss her sleep. Pt has had a sleep study in the past and has been diagnosed with moderate sleep apnea but has never been on a cpap.  Epworth Sleepiness Scale 0= would never doze 1= slight chance of dozing 2= moderate chance of dozing 3= high chance of dozing  Sitting and reading: 3 Watching TV: 3 Sitting inactive in a public place (ex. Theater or meeting): 1 As a passenger in a car for an hour without a break: 2 Lying down to rest in the afternoon: 3 Sitting and talking to someone: 1 Sitting quietly after lunch (no alcohol): 2 In a car, while stopped in traffic: 0 Total: 15     Objective:  Neurologic Exam  Physical Exam Physical Examination:   Vitals:   04/21/16 1031  BP: (!) 147/93  Pulse: 97  Resp: 20    General Examination: The patient is a very pleasant 46 y.o. female in no acute distress. She appears well-developed and well-nourished and well groomed.    HEENT: Normocephalic, atraumatic, pupils are equal, round and reactive to light and accommodation. Funduscopic exam is normal with sharp disc margins noted. Extraocular tracking is good without limitation to gaze excursion or nystagmus noted. Normal smooth pursuit is noted. Hearing is grossly intact. Face is symmetric with normal facial animation and normal facial sensation. Speech is clear with no dysarthria noted. There is no hypophonia. There is no lip, neck/head, jaw or voice tremor. Neck is supple with full range of passive and active motion. There are no  carotid bruits on auscultation. Oropharynx exam reveals: mild mouth dryness, good dental hygiene and mild to moderate airway crowding, due to smaller airway entry, uvula appears to be thicker, tonsils in place of about 1+ bilaterally. Mallampati is class II. Tongue protrudes centrally and palate elevates symmetrically. Tonsils are 1+. Neck size is 15 inches. She has a Mild overbite. Nasal inspection reveals no significant nasal mucosal bogginess or redness and no septal deviation.   Chest: Clear to auscultation without wheezing, rhonchi or crackles noted.  Heart: S1+S2+0, regular and normal without murmurs, rubs or gallops noted.   Abdomen: Soft, non-tender and non-distended with normal bowel sounds appreciated on auscultation.  Extremities: There is no pitting edema in the distal lower extremities bilaterally. Pedal pulses are intact.  Skin: Warm and dry without trophic changes noted.  Musculoskeletal: exam reveals no obvious joint deformities, tenderness or joint swelling or erythema.   Neurologically:  Mental status: The patient is awake, alert and oriented in all 4 spheres. Her immediate and remote memory, attention, language skills and fund of knowledge are appropriate. There is no evidence of aphasia, agnosia, apraxia or anomia. Speech is clear with normal prosody and enunciation. Thought process is linear. Mood is normal and affect is  normal.  Cranial nerves II - XII are as described above under HEENT exam. In addition: shoulder shrug is normal with equal shoulder height noted. Motor exam: Normal bulk, strength and tone is noted. There is no drift, tremor or rebound. Romberg is negative. Reflexes are 2+ throughout. Babinski: Toes are flexor bilaterally. Fine motor skills and coordination: intact with normal finger taps, normal hand movements, normal rapid alternating patting, normal foot taps and normal foot agility.  Cerebellar testing: No dysmetria or intention tremor on finger to nose testing. Heel to shin is unremarkable bilaterally. There is no truncal or gait ataxia.  Sensory exam: intact to light touch, pinprick, vibration, temperature sense in the upper and lower extremities.  Gait, station and balance: She stands easily. No veering to one side is noted. No leaning to one side is noted. Posture is age-appropriate and stance is narrow based. Gait shows normal stride length and normal pace. No problems turning are noted. Tandem walk is unremarkable.           Assessment and Plan:  In summary, Jennifer Mcknight is a very pleasant 46 y.o.-year old female with an underlying medical history of obesity, and anxiety, who was diagnosed with obstructive sleep apnea about a year ago. We will try to get sleep study results, she reports a diagnosis of moderate OSA. She would certainly benefit from treatment as she reports worsening daytime somnolence, her ESS is elevated at 15 out of 24 and she has had problems with her weight management, in fact has been gaining weight, this is the highest weight we have in our records. We will try to get her baseline sleep study results, she will also look at home and fax [place] the results, we provided her with our fax #. Physical and neurological exam are nonfocal. I had a long chat with the patient about my findings and the diagnosis of OSA, its prognosis and treatment options. We talked about medical treatments,  surgical interventions and non-pharmacological approaches. I explained in particular the risks and ramifications of untreated moderate to severe OSA, especially with respect to developing cardiovascular disease down the Road, including congestive heart failure, difficult to treat hypertension, cardiac arrhythmias, or stroke. Even type 2 diabetes has, in part, been linked to untreated OSA.  Symptoms of untreated OSA include daytime sleepiness, memory problems, mood irritability and mood disorder such as depression and anxiety, lack of energy, as well as recurrent headaches, especially morning headaches. We talked about trying to maintain a healthy lifestyle in general, as well as the importance of weight control. I encouraged the patient to eat healthy, exercise daily and keep well hydrated, to keep a scheduled bedtime and wake time routine, to not skip any meals and eat healthy snacks in between meals. I advised the patient not to drive when feeling sleepy. I recommended the following at this time: sleep study with positive airway pressure titration, CPAP therapy.  I explained the sleep test procedure to the patient and also outlined possible surgical and non-surgical treatment options of OSA, including the use of a custom-made dental device (which would require a referral to a specialist dentist or oral surgeon), upper airway surgical options, such as pillar implants, radiofrequency surgery, tongue base surgery, and UPPP (which would involve a referral to an ENT surgeon). Rarely, jaw surgery such as mandibular advancement may be considered.  I explained the CPAP treatment option to the patient, who indicated that she would be willing to use CPAP. I explained the importance of being compliant with PAP treatment. I answered all her questions today and the patient was in agreement. I would like to see her back after the sleep study is completed and encouraged her to call with any interim questions, concerns,  problems or updates.   Thank you very much for allowing me to participate in the care of this nice patient. If I can be of any further assistance to you please do not hesitate to call me at [phone].  Sincerely,   Star Age, MD, PhD

## 2016-04-21 NOTE — Patient Instructions (Addendum)
We will bring you in for an overnight sleep study with CPAP for treatment of your (presumed moderate) obstructive sleep apnea, in the hope, that your sleep related symptoms may improve and your weight loss efforts will be more successful.

## 2016-05-05 ENCOUNTER — Ambulatory Visit (INDEPENDENT_AMBULATORY_CARE_PROVIDER_SITE_OTHER): Payer: Self-pay | Admitting: Neurology

## 2016-05-05 DIAGNOSIS — G4733 Obstructive sleep apnea (adult) (pediatric): Secondary | ICD-10-CM

## 2016-05-05 DIAGNOSIS — G4720 Circadian rhythm sleep disorder, unspecified type: Secondary | ICD-10-CM

## 2016-05-10 ENCOUNTER — Telehealth: Payer: Self-pay

## 2016-05-10 NOTE — Telephone Encounter (Signed)
I spoke to patient and she is aware of results and recommendations. I will send copy of report to PCP. Patient made a f/u appt in June. I will send orders to [facility].

## 2016-05-10 NOTE — Addendum Note (Signed)
Addended by: Star Age on: 05/10/2016 08:10 AM   Modules accepted: Orders

## 2016-05-10 NOTE — Progress Notes (Signed)
Patient referred by PCP, seen by me on 04/21/16, CPAP study on 05/05/16 (had outside PSG in May 2017, which showed mild OSA).  Please call and inform patient that I have entered an order for treatment with positive airway pressure (PAP) treatment of obstructive sleep apnea (OSA). She did well during the latest sleep study with CPAP. We will, therefore, arrange for a machine for home use through a DME (durable medical equipment) company of Her choice; and I will see the patient back in follow-up in about 10 weeks. Please also explain to the patient that I will be looking out for compliance data, which can be downloaded from the machine (stored on an SD card, that is inserted in the machine) or via remote access through a modem, that is built into the machine. At the time of the followup appointment we will discuss sleep study results and how it is going with PAP treatment at home. Please advise patient to bring Her machine at the time of the first FU visit, even though this is cumbersome. Bringing the machine for every visit after that will likely not be needed, but often helps for the first visit to troubleshoot if needed. Please re-enforce the importance of compliance with treatment and the need for [place] to monitor compliance data - often an insurance requirement and actually good feedback for the patient as far as how they are doing.  Also remind patient, that any interim PAP machine or mask issues should be first addressed with the DME company, as they can often help better with technical and mask fit issues. Please ask if patient has a preference regarding DME company.  Please also make sure, the patient has a follow-up appointment with me in about 8-10 weeks from the setup date, thanks.  Once you have spoken to the patient - and faxed/routed report to PCP and referring MD (if other than PCP), you can close this encounter, thanks,   Star Age, MD, PhD [facility] ([place])

## 2016-05-10 NOTE — Procedures (Signed)
PATIENT'S NAME:  Jennifer Mcknight, Jennifer Mcknight DOB:      21-Apr-1970      MR#:    [id number]     DATE OF RECORDING: 05/05/2016 REFERRING M.D.:  Scarlette Calico, MD Study Performed:   CPAP  Titration HISTORY:  46 year old right-handed woman with an underlying medical history of obsesity, and anxiety, who was previously diagnosed with obstructive sleep apnea about a year ago with a sleep study. The patient endorsed the Epworth Sleepiness Scale at 15 points. Her baseline sleep study from May 2017 showed an AHI of 6/hour, O2 nadir of 89%. The patient's weight 198 pounds with a height of 67 (inches), resulting in a BMI of 31.1 kg/m2. The patient's neck circumference measured 15 inches.  CURRENT MEDICATIONS: Fetzima  PROCEDURE:  This is a multichannel digital polysomnogram utilizing the SomnoStar 11.2 system.  Electrodes and sensors were applied and monitored per AASM Specifications.   EEG, EOG, Chin and Limb EMG, were sampled at 200 Hz.  ECG, Snore and Nasal Pressure, Thermal Airflow, Respiratory Effort, CPAP Flow and Pressure, Oximetry was sampled at 50 Hz. Digital video and audio were recorded.      The patient was fitted with a small Airfit P10 nasal pillows interface. CPAP was initiated at 5 cmH20 with heated humidity per AASM standards and pressure was advanced to 7cmH20 because of hypopneas, apneas and desaturations.  At a PAP pressure of 7 cmH20, there was a reduction of the AHI to .4 with non supine REM sleep achieved and O2 nadir of 92%. improvement of the above symptoms of obstructive sleep apnea.    Lights Out was at 20:59 and Lights On at 05:10. Total recording time (TRT) was 492 minutes, with a total sleep time (TST) of 264 minutes. The patient's sleep latency was 25.5 minutes with 0.5 minutes of wake time after sleep onset. REM latency was 93 minutes.  The sleep efficiency was 53.7 %, which is markedly reduced.    SLEEP ARCHITECTURE: WASO (Wake after sleep onset)  was 202 minutes with a long period of  wakefulness between 00:30 and 03:45 AM.  There were 7 minutes in Stage N1, 59.5 minutes Stage N2, 100.5 minutes Stage N3 and 97 minutes in Stage REM.  The percentage of Stage N1 was 2.7%, Stage N2 was 22.5%, Stage N3 was 38.1%, which is increased, and Stage R (REM sleep) was 36.7%, which is increased.   The arousals were noted as: 20 were spontaneous, 0 were associated with PLMs, 1 were associated with respiratory events.  Audio and video analysis did not show any abnormal or unusual movements, behaviors, phonations or vocalizations.  The patient took 1 bathroom break. The EKG was in keeping with normal sinus rhythm (NSR).  RESPIRATORY ANALYSIS:  There was a total of 1 respiratory events: 0 obstructive apneas, 1 central apneas and 0 mixed apneas with a total of 1 apneas and an apnea index (AI) of .2 /hour. There were 0 hypopneas with a hypopnea index of 0/hour. The patient also had 1 respiratory event related arousals (RERAs).      The total APNEA/HYPOPNEA INDEX  (AHI) was .2 /hour and the total RESPIRATORY DISTURBANCE INDEX was .5 .hour  1 events occurred in REM sleep and 0 events in NREM. The REM AHI was .6 /hour versus a non-REM AHI of 0 /hour.  The patient spent 45 minutes of total sleep time in the supine position and 219 minutes in non-supine. The supine AHI was 0.0, versus a non-supine AHI of 0.3.  OXYGEN SATURATION & C02:  The baseline 02 saturation was 96%, with the lowest being 89%. Time spent below 89% saturation equaled 0 minutes.  PERIODIC LIMB MOVEMENTS: The patient had a total of 0 Periodic Limb Movements. The Periodic Limb Movement (PLM) index was 0 and the PLM Arousal index was 0 /hour.  Post-study, the patient indicated that sleep was the same as usual.   DIAGNOSIS 1. Obstructive Sleep Apnea (OSA)  2. Dysfunctions associated with sleep stages or arousal from sleep    PLANS/RECOMMENDATIONS:  1. This study demonstrates resolution of the patient's obstructive sleep apnea with  CPAP therapy. I will, therefore, start the patient on home CPAP treatment at a pressure of 7 cm via small nasal pillows with heated humidity. The patient should be reminded to be fully compliant with PAP therapy to improve sleep related symptoms and decrease long term cardiovascular risks. The patient should be reminded, that it may take up to 3 months to get fully used to using PAP with all planned sleep. The earlier full compliance is achieved, the better long term compliance tends to be. Please note that untreated obstructive sleep apnea carries additional perioperative morbidity. Patients with significant obstructive sleep apnea should receive perioperative PAP therapy and the surgeons and particularly the anesthesiologist should be informed of the diagnosis and the severity of the sleep disordered breathing. 2. This study shows sleep fragmentation and abnormal sleep stage percentages; these are nonspecific findings and per se do not signify an intrinsic sleep disorder or a cause for the patient's sleep-related symptoms. Causes include (but are not limited to) the first night effect of the sleep study, circadian rhythm disturbances, medication effect or an underlying mood disorder or medical problem.  3. The patient should be cautioned not to drive, work at heights, or operate dangerous or heavy equipment when tired or sleepy. Review and reiteration of good sleep hygiene measures should be pursued with any patient. 4. The patient will be seen in follow-up by Dr. Rexene Alberts at [facility] for discussion of the test results and further management strategies. The referring provider will be notified of the test results.  I certify that I have reviewed the entire raw data recording prior to the issuance of this report in accordance with the Standards of Accreditation of the American Academy of Sleep Medicine (AASM)   Star Age, MD, PhD Diplomat, American Board of Psychiatry and Neurology (Neurology and Sleep  Medicine)

## 2016-05-10 NOTE — Telephone Encounter (Signed)
-----   Message from Star Age, MD sent at 05/10/2016  8:10 AM EDT ----- Patient referred by PCP, seen by me on 04/21/16, CPAP study on 05/05/16 (had outside PSG in May 2017, which showed mild OSA).  Please call and inform patient that I have entered an order for treatment with positive airway pressure (PAP) treatment of obstructive sleep apnea (OSA). She did well during the latest sleep study with CPAP. We will, therefore, arrange for a machine for home use through a DME (durable medical equipment) company of Her choice; and I will see the patient back in follow-up in about 10 weeks. Please also explain to the patient that I will be looking out for compliance data, which can be downloaded from the machine (stored on an SD card, that is inserted in the machine) or via remote access through a modem, that is built into the machine. At the time of the followup appointment we will discuss sleep study results and how it is going with PAP treatment at home. Please advise patient to bring Her machine at the time of the first FU visit, even though this is cumbersome. Bringing the machine for every visit after that will likely not be needed, but often helps for the first visit to troubleshoot if needed. Please re-enforce the importance of compliance with treatment and the need for [place] to monitor compliance data - often an insurance requirement and actually good feedback for the patient as far as how they are doing.  Also remind patient, that any interim PAP machine or mask issues should be first addressed with the DME company, as they can often help better with technical and mask fit issues. Please ask if patient has a preference regarding DME company.  Please also make sure, the patient has a follow-up appointment with me in about 8-10 weeks from the setup date, thanks.  Once you have spoken to the patient - and faxed/routed report to PCP and referring MD (if other than PCP), you can close this encounter, thanks,    Star Age, MD, PhD [facility] ([place])

## 2016-06-01 ENCOUNTER — Telehealth: Payer: Self-pay | Admitting: Internal Medicine

## 2016-06-01 NOTE — Telephone Encounter (Signed)
Pt called stating her [place] 80 MG CP24  has gone from $300 to $400, Pt does not have insurance, would like to know if there are any free samples. Or coupons

## 2016-06-02 NOTE — Telephone Encounter (Signed)
Pt informed that we do not have any coupons.

## 2016-07-26 ENCOUNTER — Encounter: Payer: Self-pay | Admitting: Neurology

## 2016-07-28 ENCOUNTER — Ambulatory Visit (INDEPENDENT_AMBULATORY_CARE_PROVIDER_SITE_OTHER): Payer: Self-pay | Admitting: Neurology

## 2016-07-28 ENCOUNTER — Encounter: Payer: Self-pay | Admitting: Neurology

## 2016-07-28 VITALS — BP 125/79 | HR 88 | Ht 67.0 in | Wt 192.0 lb

## 2016-07-28 DIAGNOSIS — Z9989 Dependence on other enabling machines and devices: Secondary | ICD-10-CM

## 2016-07-28 DIAGNOSIS — G4733 Obstructive sleep apnea (adult) (pediatric): Secondary | ICD-10-CM

## 2016-07-28 NOTE — Progress Notes (Signed)
Subjective:    Patient ID: Jennifer Mcknight is a 46 y.o. female.  HPI     Interim history:   [name] is a 46 year old right-handed woman with an underlying medical history of obesity, and anxiety, who presents for follow-up consultation of her obstructive sleep apnea, after her recent CPAP titration study. The patient is accompanied by her daughter today. I first met her on 04/21/2016 at the request of her primary care physician, at which time she reported a prior diagnosis of OSA. Her baseline sleep study results from 07/14/2015 showed an AHI of 6 per hour, O2 nadir of 89%. She was advised to return for a CPAP titration study. She had sleep-related complaints of snoring and excessive daytime somnolence, difficulty with weight loss. She had seen weight management at [facility]. She had a CPAP titration study on 05/05/2016. I went over her test results with her in detail today. Sleep efficiency was 53.7%, sleep latency 25.5 minutes, REM latency 93 minutes. Wake after sleep onset was highly elevated at 202 minutes with an almost 3 hour period of wakefulness between 12:30 AM and 3:45 AM. CPAP was titrated from 5 cm to 7 cm. At a pressure of 7 cm her AHI was 0.4 per hour, nonsupine REM sleep achieved, O2 nadir of 92%. Based on her test results are prescribed CPAP therapy for home use.  Today, 07/28/2016 (all dictated new, as well as above notes, some dictation done in note pad or Word, outside of chart, may appear as copied):   I reviewed her CPAP compliance data from 06/27/2016 through 07/26/2016 which is a total of 30 days, during which time she used her machine only 20 days with percent used days greater than 4 hours at 27% only, indicating poor compliance, average usage of 3 hours and 52 minutes only, residual AHI 2 per hour, leak low, pressure at 7 cm. In the past 90 days her compliance percentage was lower at 20%. She reports that she still adjusting to treatment and actually does feel improved when she  uses CPAP. She gets tangled up with the hose sometimes. She is trying to lose weight, has in fact lost a few lb and feels, she has more energy. Per daughter, seems less tired. No longer snores. Is motivated to continue.    The patient's allergies, current medications, family history, past medical history, past social history, past surgical history and problem list were reviewed and updated as appropriate.   Previously (copied from previous notes for reference):   04/21/2016: She was previously diagnosed with obstructive sleep apnea about a year ago with a sleep study. Prior sleep study results are not available for my review. I reviewed your office note from 04/14/2016. She was never treated with CPAP. She reports snoring and excessive daytime somnolence, and difficulty with her weight management. In fact, she started seeing weight management at [facility] and had some modest amount of weight loss but not sustained and in fact she has been gaining weight. She had the sleep study in the context of her weight management appointment. She does not have results her self but was told that she had moderate obstructive sleep apnea and that treatment with CPAP would be the next step. She did not have insurance in the interim but does not want to wait until she has insurance because she continues to feel bad and in fact she feels worse with respect to her daytime somnolence as well as her weight gain. Her Epworth sleepiness score is 15  out of 24, fatigue score is 54 out of 63. She limits her caffeine to 2 servings per day in the form of coffee, tea or soda. She tries to be in bed around 10 and wake up time is 6:30 but she does not wake up rested. She denies morning headaches but has nocturia once per night, almost always at 3 AM. She snores loudly. Her husband is a restless sleeper as well. She does not endorse any restless leg symptoms or leg twitching at night. She is not aware of any family history of OSA. 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 She has 2 children, a 67 year old daughter and 74 year old son. She often takes a nap before picking her son up from school. She quit smoking about a year ago. She drinks alcohol about every other week or so. She had sinus surgery in her 3s and her uvula was trimmed at the time.  We looked in her care everywhere chart and it appears that she had a sleep study on 07/15/2015 but results are not visible for [place]. We will try to get records from [facility] and she will also look at home. She has been on Fetzima for about 3 years, it has helped her mood.   04/21/2016, 1 PM: I reviewed sleep study results from 07/14/2015. Sleep efficiency was 80.4%, sleep latency was 68.5 minutes. Wake after sleep onset was 37 minutes, percentage of stage I was normal, percentage of stage II was normal, stage III was 24.7% and REM stage was 27.8%, mildly increased, REM latency 96 minutes, normal. Arousal index was mildly elevated at 19.7 per hour. Total AHI was in the mild range at 6.0 per hour, REM AHI was 4.7 per hour, supine AHI was 2.7 per hour, average oxygen saturation was 94%, nadir was 89%. PLM index was 12.8, PLM arousal index was 6.6 per hour. Patient overall had mild obstructive sleep apnea.   Her Past Medical History Is Significant For: Past Medical History:  Diagnosis Date  . Depression     Her Past Surgical History Is Significant For: Past Surgical History:  Procedure Laterality Date  . CESAREAN SECTION    . NASAL SINUS SURGERY    . TUBAL LIGATION      Her Family History Is Significant For: Family History  Problem Relation Age of Onset  . Alcohol abuse Other   . Drug abuse Other   . Depression Other   . Diabetes Father   . Depression Father   . Cancer Neg Hx   . Heart disease Neg Hx   . Early death Neg Hx   . Hyperlipidemia Neg Hx   . Hypertension Neg Hx   . Kidney disease Neg Hx   . Learning disabilities Neg Hx   . Stroke Neg Hx      Her Social History Is Significant For: Social History   Social History  . Marital status: Married    Spouse name: N/A  . Number of children: N/A  . Years of education: N/A   Social History Main Topics  . Smoking status: Never Smoker  . Smokeless tobacco: Never Used  . Alcohol use No  . Drug use: No  . Sexual activity: Yes    Birth control/ protection: Surgical   Other Topics Concern  . None   Social History Narrative  . None    Her Allergies Are:  No Known Allergies:   Her Current Medications Are:  Outpatient Encounter Prescriptions  as of 07/28/2016  Medication Sig  . [place] 80 MG CP24 TAKE ONE CAPSULE BY MOUTH EVERY DAY   No facility-administered encounter medications on file as of 07/28/2016.   :  Review of Systems:  Out of a complete 14 point review of systems, all are reviewed and negative with the exception of these symptoms as listed below: Review of Systems  Neurological:       Pt presents today to discuss her cpap. Pt is getting tangled in her hoses and is still getting used to the cpap. Pt does report feeling better after using the cpap.    Objective:  Neurologic Exam  Physical Exam Physical Examination:   Vitals:   07/28/16 1027  BP: 125/79  Pulse: 88   General Examination: The patient is a very pleasant 46 y.o. female in no acute distress. She appears well-developed and well-nourished and well groomed.   HEENT: Normocephalic, atraumatic, pupils are equal, round and reactive to light and accommodation. She has corrective eyeglasses. Extraocular tracking is good without limitation to gaze excursion or nystagmus noted. Normal smooth pursuit is noted. Hearing is grossly intact. Face is symmetric with normal facial animation and normal facial sensation. Speech is clear with no dysarthria noted. There is no hypophonia. There is no lip, neck/head, jaw or voice tremor. Neck withfull range of motion. Oropharynx exam reveals: mild mouth dryness, good dental  hygiene and mild to moderate airway crowding. No sores around her nostrils.  Chest: Clear to auscultation without wheezing, rhonchi or crackles noted.  Heart: S1+S2+0, regular and normal without murmurs, rubs or gallops noted.   Abdomen: Soft, non-tender and non-distended.  Extremities: There is no lower extremity edema.   Skin: Warm and dry without trophic changes noted.  Musculoskeletal: exam reveals no obvious joint deformities, tenderness or joint swelling or erythema.   Neurologically:  Mental status: The patient is awake, alert and oriented in all 4 spheres. Her immediate and remote memory, attention, language skills and fund of knowledge are appropriate. There is no evidence of aphasia, agnosia, apraxia or anomia. Speech is clear with normal prosody and enunciation. Thought process is linear. Mood is normal and affect is normal.  Cranial nerves II - XII are as described above under HEENT exam.  Motor exam: Normal bulk, strength and tone is noted. There is no drift, tremor or rebound. Romberg is negative. Reflexes are 2+ throughout. Fine motor skills and coordination: grossly intact. Cerebellar testing: No dysmetria or intention tremor on finger to nose testing. There is no truncal or gait ataxia.  Sensory exam: intact to light touch in the upper and lower extremities.  Gait, station and balance: She stands easily. No veering to one side is noted. No leaning to one side is noted. Posture is age-appropriate and stance is narrow based. Gait shows normal stride length and normal pace. No problems turning are noted. Tandem walk is unremarkable.           Assessment and Plan:  In summary, Hazle Ogburn is a very pleasant 46 year old female with an underlying medical history of obesity, and anxiety, who was diagnosed with obstructive sleep apnea in May 2017 with an outside sleep study. She had a recent CPAP titration study in March 2018. She has established treatment with CPAP of 7 cm via  nasal pillows. She had some trouble in the beginning adjusting to treatment and is getting better in that regard. She feels that in the  Much better. In fact, in the most recent 30 day  compliance data it looks like she has consistently used her CPAP in the past 11 days. She is commended for this. She has experienced improvement in her daytime energy and daytime somnolence. She is more motivated to exercise and has in fact been able to lose some weight. She is motivated to continue with CPAP therapy. She is observed to be less tired during the day by her daughter who is here with her and her husband reported that she no longer snores when she uses her CPAP. I suggested a six-month follow-up with one of our nurse practitioners for recheck and she is encouraged to call with any interim questions or concerns. Today, we briefly discussed her baseline sleep study results from May 2017 and went over her CPAP titration results in detail as well as her compliance data. I answered all her questions today and the patient and her daughter were in agreement with the plan.

## 2016-07-28 NOTE — Patient Instructions (Signed)
Please continue using your CPAP regularly. While your insurance requires that you use CPAP at least 4 hours each night on 70% of the nights, I recommend, that you not skip any nights and use it throughout the night if you can. Getting used to CPAP and staying with the treatment long term does take time and patience and discipline. Untreated obstructive sleep apnea when it is moderate to severe can have an adverse impact on cardiovascular health and raise her risk for heart disease, arrhythmias, hypertension, congestive heart failure, stroke and diabetes. Untreated obstructive sleep apnea causes sleep disruption, nonrestorative sleep, and sleep deprivation. This can have an impact on your day to day functioning and cause daytime sleepiness and impairment of cognitive function, memory loss, mood disturbance, and problems focussing. Using CPAP regularly can improve these symptoms.  Follow up in 6 months.

## 2016-10-24 ENCOUNTER — Other Ambulatory Visit: Payer: Self-pay | Admitting: Internal Medicine

## 2016-11-21 ENCOUNTER — Other Ambulatory Visit: Payer: Self-pay | Admitting: Internal Medicine

## 2016-12-19 ENCOUNTER — Other Ambulatory Visit: Payer: Self-pay | Admitting: Internal Medicine

## 2017-01-18 ENCOUNTER — Other Ambulatory Visit: Payer: Self-pay | Admitting: Internal Medicine

## 2017-01-31 ENCOUNTER — Ambulatory Visit: Payer: Self-pay | Admitting: Adult Health

## 2017-02-27 ENCOUNTER — Other Ambulatory Visit: Payer: Self-pay | Admitting: Internal Medicine

## 2017-03-01 ENCOUNTER — Other Ambulatory Visit: Payer: Self-pay | Admitting: Internal Medicine

## 2017-03-01 ENCOUNTER — Telehealth: Payer: Self-pay | Admitting: Internal Medicine

## 2017-03-01 DIAGNOSIS — F329 Major depressive disorder, single episode, unspecified: Secondary | ICD-10-CM

## 2017-03-01 MED ORDER — LEVOMILNACIPRAN HCL ER 80 MG PO CP24: 1.0000 | ORAL_CAPSULE | Freq: Every day | ORAL | 0 refills | Status: DC

## 2017-03-01 NOTE — Telephone Encounter (Signed)
Copied from [place] [id number]. Topic: Quick Communication - Rx Refill/Question >> Mar 01, 2017  9:39 AM Scherrie Gerlach wrote: Medication: FETZIMA 80 MG CP24  Has the patient contacted their pharmacy? {yes/ but told the office to call and make an appt in order to get her refill.  [facility] - [place] [address], [place] [phone] (Phone) [phone] (Fax)  Pt has made an appt for tomorrow, but has run out of her med.  Prefers not to go one day without due to the med. Can you send 30 days to the pharmacy?

## 2017-03-01 NOTE — Telephone Encounter (Signed)
Patient called in to request 30 day refill on Fetzima 80 mg, pharmacy would not fill due to patient needed an OV in order to get refill, patient is out of her med,  OV scheduled for tomorrow 03/02/17.

## 2017-03-02 ENCOUNTER — Ambulatory Visit (INDEPENDENT_AMBULATORY_CARE_PROVIDER_SITE_OTHER): Payer: BLUE CROSS/BLUE SHIELD | Admitting: Internal Medicine

## 2017-03-02 ENCOUNTER — Encounter: Payer: Self-pay | Admitting: Internal Medicine

## 2017-03-02 VITALS — BP 130/80 | HR 84 | Temp 98.2°F | Resp 16 | Ht 67.0 in | Wt 194.0 lb

## 2017-03-02 DIAGNOSIS — F329 Major depressive disorder, single episode, unspecified: Secondary | ICD-10-CM

## 2017-03-02 DIAGNOSIS — Z124 Encounter for screening for malignant neoplasm of cervix: Secondary | ICD-10-CM | POA: Insufficient documentation

## 2017-03-02 DIAGNOSIS — F341 Dysthymic disorder: Secondary | ICD-10-CM | POA: Diagnosis not present

## 2017-03-02 MED ORDER — LEVOMILNACIPRAN HCL ER 80 MG PO CP24: 1.0000 | ORAL_CAPSULE | Freq: Every day | ORAL | 3 refills | Status: DC

## 2017-03-02 NOTE — Progress Notes (Signed)
   Subjective:  Patient ID: Jennifer Mcknight, female    DOB: 12/07/1970  Age: 47 y.o. MRN: [id number]  CC: Depression   HPI Jennifer Mcknight presents for f/up -she reports that her mood is good on the current dose of Fetzima.  She feels optimistic and energetic.  She wants to continue this at the current dose.  She offers no new symptoms today.  Outpatient Medications Prior to Visit  Medication Sig Dispense Refill  . Levomilnacipran HCl ER (FETZIMA) 80 MG CP24 Take 1 capsule by mouth daily. 30 capsule 0   No facility-administered medications prior to visit.     ROS Review of Systems  Psychiatric/Behavioral: Negative for agitation, behavioral problems, dysphoric mood, self-injury, sleep disturbance and suicidal ideas. The patient is not nervous/anxious.   All other systems reviewed and are negative.   Objective:  BP 130/80 (BP Location: Left Arm, Patient Position: Sitting, Cuff Size: Large)   Pulse 84   Temp 98.2 F (36.8 C) (Oral)   Resp 16   Ht 5' 7"  (1.702 m)   Wt 194 lb (88 kg)   SpO2 98%   BMI 30.38 kg/m   BP Readings from Last 3 Encounters:  03/02/17 130/80  07/28/16 125/79  04/21/16 (!) 147/93    Wt Readings from Last 3 Encounters:  03/02/17 194 lb (88 kg)  07/28/16 192 lb (87.1 kg)  04/21/16 198 lb (89.8 kg)    Physical Exam  Psychiatric: She has a normal mood and affect. Her behavior is normal. Judgment and thought content normal. Her mood appears not anxious. Her speech is not rapid and/or pressured and not tangential. Thought content is not paranoid. She does not exhibit a depressed mood. She expresses no homicidal and no suicidal ideation.    Lab Results  Component Value Date   WBC 13.2 (H) 04/14/2016   HGB 14.3 04/14/2016   HCT 43.3 04/14/2016   PLT 486.0 (H) 04/14/2016   GLUCOSE 83 04/14/2016   CHOL 239 (H) 07/30/2014   TRIG 156.0 (H) 07/30/2014   HDL 57.00 07/30/2014   LDLDIRECT 123.1 03/21/2013   LDLCALC 151 (H) 07/30/2014   ALT 34 04/14/2016   AST  27 04/14/2016   NA 137 04/14/2016   K 4.0 04/14/2016   CL 104 04/14/2016   CREATININE 0.71 04/14/2016   BUN 12 04/14/2016   CO2 27 04/14/2016   TSH 1.99 04/14/2016    No results found.  Assessment & Plan:   Jennifer Mcknight was seen today for depression.  Diagnoses and all orders for this visit:  Screening for cervical cancer -     Ambulatory referral to Gynecology  Major depression, chronic- Will continue Fetzima at the current dose. -     Levomilnacipran HCl ER ([place]) 80 MG CP24; Take 1 capsule by mouth daily.   I am having Jennifer Mcknight maintain her Levomilnacipran HCl ER.  Meds ordered this encounter  Medications  . Levomilnacipran HCl ER (FETZIMA) 80 MG CP24    Sig: Take 1 capsule by mouth daily.    Dispense:  90 capsule    Refill:  3     Follow-up: Return in about 1 year (around 03/02/2018).  Scarlette Calico, MD

## 2017-03-02 NOTE — Patient Instructions (Signed)
Major Depressive Disorder, Adult Major depressive disorder (MDD) is a mental health condition. It may also be called clinical depression or unipolar depression. MDD usually causes feelings of sadness, hopelessness, or helplessness. MDD can also cause physical symptoms. It can interfere with work, school, relationships, and other everyday activities. MDD may be mild, moderate, or severe. It may occur once (single episode major depressive disorder) or it may occur multiple times (recurrent major depressive disorder). What are the causes? The exact cause of this condition is not known. MDD is most likely caused by a combination of things, which may include:  Genetic factors. These are traits that are passed along from parent to child.  Individual factors. Your personality, your behavior, and the way you handle your thoughts and feelings may contribute to MDD. This includes personality traits and behaviors learned from others.  Physical factors, such as: ? Differences in the part of your brain that controls emotion. This part of your brain may be different than it is in people who do not have MDD. ? Long-term (chronic) medical or psychiatric illnesses.  Social factors. Traumatic experiences or major life changes may play a role in the development of MDD.  What increases the risk? This condition is more likely to develop in women. The following factors may also make you more likely to develop MDD:  A family history of depression.  Troubled family relationships.  Abnormally low levels of certain brain chemicals.  Traumatic events in childhood, especially abuse or the loss of a parent.  Being under a lot of stress, or long-term stress, especially from upsetting life experiences or losses.  A history of: ? Chronic physical illness. ? Other mental health disorders. ? Substance abuse.  Poor living conditions.  Experiencing social exclusion or discrimination on a regular basis.  What are  the signs or symptoms? The main symptoms of MDD typically include:  Constant depressed or irritable mood.  Loss of interest in things and activities.  MDD symptoms may also include:  Sleeping or eating too much or too little.  Unexplained weight change.  Fatigue or low energy.  Feelings of worthlessness or guilt.  Difficulty thinking clearly or making decisions.  Thoughts of suicide or of harming others.  Physical agitation or weakness.  Isolation.  Severe cases of MDD may also occur with other symptoms, such as:  Delusions or hallucinations, in which you imagine things that are not real (psychotic depression).  Low-level depression that lasts at least a year (chronic depression or persistent depressive disorder).  Extreme sadness and hopelessness (melancholic depression).  Trouble speaking and moving (catatonic depression).  How is this diagnosed? This condition may be diagnosed based on:  Your symptoms.  Your medical history, including your mental health history. This may involve tests to evaluate your mental health. You may be asked questions about your lifestyle, including any drug and alcohol use, and how long you have had symptoms of MDD.  A physical exam.  Blood tests to rule out other conditions.  You must have a depressed mood and at least four other MDD symptoms most of the day, nearly every day in the same 2-week timeframe before your health care provider can confirm a diagnosis of MDD. How is this treated? This condition is usually treated by mental health professionals, such as psychologists, psychiatrists, and clinical social workers. You may need more than one type of treatment. Treatment may include:  Psychotherapy. This is also called talk therapy or counseling. Types of psychotherapy include: ? Cognitive behavioral  therapy (CBT). This type of therapy teaches you to recognize unhealthy feelings, thoughts, and behaviors, and replace them with  positive thoughts and actions. ? Interpersonal therapy (IPT). This helps you to improve the way you relate to and communicate with others. ? Family therapy. This treatment includes members of your family.  Medicine to treat anxiety and depression, or to help you control certain emotions and behaviors.  Lifestyle changes, such as: ? Limiting alcohol and drug use. ? Exercising regularly. ? Getting plenty of sleep. ? Making healthy eating choices. ? Spending more time outdoors.  Treatments involving stimulation of the brain can be used in situations with extremely severe symptoms, or when medicine or other therapies do not work over time. These treatments include electroconvulsive therapy, transcranial magnetic stimulation, and vagal nerve stimulation. Follow these instructions at home: Activity  Return to your normal activities as told by your health care provider.  Exercise regularly and spend time outdoors as told by your health care provider. General instructions  Take over-the-counter and prescription medicines only as told by your health care provider.  Do not drink alcohol. If you drink alcohol, limit your alcohol intake to no more than 1 drink a day for nonpregnant women and 2 drinks a day for men. One drink equals 12 oz of beer, 5 oz of wine, or 1 oz of hard liquor. Alcohol can affect any antidepressant medicines you are taking. Talk to your health care provider about your alcohol use.  Eat a healthy diet and get plenty of sleep.  Find activities that you enjoy doing, and make time to do them.  Consider joining a support group. Your health care provider may be able to recommend a support group.  Keep all follow-up visits as told by your health care provider. This is important. Where to find more information: [employer] on Mental Illness  www.nami.org  U.S. National Institute of Mental Health  https://carter.com/  National Suicide Prevention  Lifeline  1-800-273-TALK 734-469-8624). This is free, 24-hour help.  Contact a health care provider if:  Your symptoms get worse.  You develop new symptoms. Get help right away if:  You self-harm.  You have serious thoughts about hurting yourself or others.  You see, hear, taste, smell, or feel things that are not present (hallucinate). This information is not intended to replace advice given to you by your health care provider. Make sure you discuss any questions you have with your health care provider. Document Released: 06/04/2012 Document Revised: 10/15/2015 Document Reviewed: 08/19/2015 Elsevier Interactive Patient Education  [employer].

## 2017-03-06 ENCOUNTER — Ambulatory Visit: Payer: Self-pay | Admitting: Women's Health

## 2017-03-07 ENCOUNTER — Telehealth: Payer: Self-pay

## 2017-03-07 NOTE — Telephone Encounter (Signed)
Will you inform pt that PA was approved.

## 2017-03-07 NOTE — Telephone Encounter (Signed)
Key: [place]

## 2017-03-08 DIAGNOSIS — L719 Rosacea, unspecified: Secondary | ICD-10-CM | POA: Diagnosis not present

## 2017-03-08 NOTE — Telephone Encounter (Signed)
Spoke with pt to let her know.

## 2017-03-25 ENCOUNTER — Other Ambulatory Visit: Payer: Self-pay | Admitting: Internal Medicine

## 2017-03-25 DIAGNOSIS — F329 Major depressive disorder, single episode, unspecified: Secondary | ICD-10-CM

## 2017-05-01 ENCOUNTER — Telehealth: Payer: Self-pay | Admitting: *Deleted

## 2017-05-01 ENCOUNTER — Ambulatory Visit: Payer: Self-pay | Admitting: Adult Health

## 2017-05-01 ENCOUNTER — Encounter: Payer: Self-pay | Admitting: Adult Health

## 2017-05-01 NOTE — Telephone Encounter (Signed)
Patient was no show for follow up with NP today.  

## 2017-09-19 ENCOUNTER — Telehealth: Payer: Self-pay | Admitting: Internal Medicine

## 2017-09-19 DIAGNOSIS — F329 Major depressive disorder, single episode, unspecified: Secondary | ICD-10-CM

## 2017-09-25 DIAGNOSIS — Z1231 Encounter for screening mammogram for malignant neoplasm of breast: Secondary | ICD-10-CM | POA: Diagnosis not present

## 2017-09-25 LAB — HM MAMMOGRAPHY

## 2017-09-25 NOTE — Telephone Encounter (Signed)
Patient is checking status, pharmacy does not have any refills on file. Patient will run out of meds soon. CB# [phone]

## 2017-09-28 ENCOUNTER — Other Ambulatory Visit: Payer: Self-pay | Admitting: Internal Medicine

## 2017-09-28 DIAGNOSIS — F329 Major depressive disorder, single episode, unspecified: Secondary | ICD-10-CM

## 2017-09-29 MED ORDER — LEVOMILNACIPRAN HCL ER 80 MG PO CP24: 1.0000 | ORAL_CAPSULE | Freq: Every day | ORAL | 1 refills | Status: DC

## 2017-09-29 NOTE — Telephone Encounter (Signed)
Patient called and states the pharmacy has sent over multiple request for a Refill of her Levomilnacipran HCl ER ([place]) 80 MG CP24  Please call patient when the Rx is sent CB# [phone]

## 2017-09-29 NOTE — Telephone Encounter (Signed)
Spoke to pharmacy. They stated that they did not receive the rx in January 10th for the year supply. Rx has been resent for 6 months. Pt will need an appt for refills.

## 2017-10-03 ENCOUNTER — Encounter: Payer: Self-pay | Admitting: Internal Medicine

## 2017-12-26 ENCOUNTER — Other Ambulatory Visit: Payer: BLUE CROSS/BLUE SHIELD

## 2017-12-26 ENCOUNTER — Encounter: Payer: Self-pay | Admitting: Internal Medicine

## 2017-12-26 ENCOUNTER — Ambulatory Visit: Payer: BLUE CROSS/BLUE SHIELD | Admitting: Internal Medicine

## 2017-12-26 ENCOUNTER — Other Ambulatory Visit (INDEPENDENT_AMBULATORY_CARE_PROVIDER_SITE_OTHER): Payer: BLUE CROSS/BLUE SHIELD

## 2017-12-26 VITALS — BP 138/80 | HR 95 | Temp 97.6°F | Resp 16 | Ht 67.0 in | Wt 162.0 lb

## 2017-12-26 DIAGNOSIS — Z0000 Encounter for general adult medical examination without abnormal findings: Secondary | ICD-10-CM

## 2017-12-26 DIAGNOSIS — D473 Essential (hemorrhagic) thrombocythemia: Secondary | ICD-10-CM

## 2017-12-26 DIAGNOSIS — D75839 Thrombocytosis, unspecified: Secondary | ICD-10-CM | POA: Insufficient documentation

## 2017-12-26 DIAGNOSIS — Z124 Encounter for screening for malignant neoplasm of cervix: Secondary | ICD-10-CM

## 2017-12-26 DIAGNOSIS — R945 Abnormal results of liver function studies: Secondary | ICD-10-CM | POA: Diagnosis not present

## 2017-12-26 DIAGNOSIS — R7989 Other specified abnormal findings of blood chemistry: Secondary | ICD-10-CM | POA: Insufficient documentation

## 2017-12-26 LAB — LIPID PANEL
CHOLESTEROL: 242 mg/dL — AB (ref 0–200)
HDL: 75 mg/dL (ref >39.00)
LDL CALC: 145 mg/dL — AB (ref 0–99)
NonHDL: 166.56
Total CHOL/HDL Ratio: 3
Triglycerides: 109 mg/dL (ref 0.0–149.0)
VLDL: 21.8 mg/dL (ref 0.0–40.0)

## 2017-12-26 LAB — COMPREHENSIVE METABOLIC PANEL
ALBUMIN: 4.8 g/dL (ref 3.5–5.2)
ALK PHOS: 90 U/L (ref 39–117)
ALT: 84 U/L — AB (ref 0–35)
AST: 55 U/L — AB (ref 0–37)
BILIRUBIN TOTAL: 0.3 mg/dL (ref 0.2–1.2)
BUN: 16 mg/dL (ref 6–23)
CO2: 29 mEq/L (ref 19–32)
CREATININE: 0.64 mg/dL (ref 0.40–1.20)
Calcium: 10.1 mg/dL (ref 8.4–10.5)
Chloride: 101 mEq/L (ref 96–112)
GFR: 106.16 mL/min (ref >60.00)
Glucose, Bld: 94 mg/dL (ref 70–99)
Potassium: 4 mEq/L (ref 3.5–5.1)
SODIUM: 137 meq/L (ref 135–145)
TOTAL PROTEIN: 7.8 g/dL (ref 6.0–8.3)

## 2017-12-26 LAB — CBC WITH DIFFERENTIAL/PLATELET
BASOS PCT: 1 % (ref 0.0–3.0)
Basophils Absolute: 0.1 10*3/uL (ref 0.0–0.1)
EOS ABS: 0.2 10*3/uL (ref 0.0–0.7)
EOS PCT: 2.7 % (ref 0.0–5.0)
HEMATOCRIT: 42.2 % (ref 36.0–46.0)
HEMOGLOBIN: 14.1 g/dL (ref 12.0–15.0)
LYMPHS PCT: 39.2 % (ref 12.0–46.0)
Lymphs Abs: 3.6 10*3/uL (ref 0.7–4.0)
MCHC: 33.4 g/dL (ref 30.0–36.0)
MCV: 89.9 fl (ref 78.0–100.0)
Monocytes Absolute: 0.7 10*3/uL (ref 0.1–1.0)
Monocytes Relative: 7.4 % (ref 3.0–12.0)
Neutro Abs: 4.6 10*3/uL (ref 1.4–7.7)
Neutrophils Relative %: 49.7 % (ref 43.0–77.0)
Platelets: 518 10*3/uL — ABNORMAL HIGH (ref 150.0–400.0)
RBC: 4.7 Mil/uL (ref 3.87–5.11)
RDW: 12.9 % (ref 11.5–15.5)
WBC: 9.3 10*3/uL (ref 4.0–10.5)

## 2017-12-26 NOTE — Patient Instructions (Signed)

## 2017-12-26 NOTE — Progress Notes (Signed)
Subjective:  Patient ID: Jennifer Mcknight, female    DOB: [birth year]  Age: 47 y.o. MRN: [id number]  CC: Annual Exam   HPI Jennifer Mcknight presents for a CPX.  She is separated from her husband and wants me to write a letter for her to have an emotional support dog that she can have while she lives with her daughter in her daughter's apartment.  She has been crying lately but denies SI or HI.  She wants to stay on the current dose of Fetzima.  She otherwise feels well and offers no complaints.  Outpatient Medications Prior to Visit  Medication Sig Dispense Refill  . Levomilnacipran HCl ER (FETZIMA) 80 MG CP24 Take 1 capsule by mouth daily. 90 capsule 1   No facility-administered medications prior to visit.     ROS Review of Systems  Constitutional: Negative for appetite change, diaphoresis, fatigue and unexpected weight change.       +intentional weight loss  HENT: Negative.   Eyes: Negative for visual disturbance.  Respiratory: Negative for cough, chest tightness, shortness of breath and wheezing.   Cardiovascular: Negative for chest pain, palpitations and leg swelling.  Gastrointestinal: Negative for abdominal pain, constipation, diarrhea, nausea and vomiting.  Endocrine: Negative.   Genitourinary: Negative.  Negative for difficulty urinating.  Musculoskeletal: Negative.  Negative for arthralgias and myalgias.  Skin: Negative.  Negative for color change and pallor.  Neurological: Negative.  Negative for dizziness, weakness, light-headedness and headaches.  Hematological: Negative for adenopathy. Does not bruise/bleed easily.  Psychiatric/Behavioral: Positive for dysphoric mood. Negative for confusion, decreased concentration, self-injury, sleep disturbance and suicidal ideas. The patient is not nervous/anxious.     Objective:  BP 138/80 (BP Location: Left Arm, Patient Position: Sitting, Cuff Size: Normal)   Pulse 95   Temp 97.6 F (36.4 C) (Oral)   Resp 16   Ht 5' 7"  (1.702 m)    Wt 162 lb (73.5 kg)   LMP 12/17/2017   SpO2 99%   BMI 25.37 kg/m   BP Readings from Last 3 Encounters:  12/26/17 138/80  03/02/17 130/80  07/28/16 125/79    Wt Readings from Last 3 Encounters:  12/26/17 162 lb (73.5 kg)  03/02/17 194 lb (88 kg)  07/28/16 192 lb (87.1 kg)    Physical Exam  Constitutional: She is oriented to person, place, and time. No distress.  HENT:  Mouth/Throat: Oropharynx is clear and moist. No oropharyngeal exudate.  Eyes: Conjunctivae are normal. No scleral icterus.  Neck: Normal range of motion. Neck supple. No JVD present. No thyromegaly present.  Cardiovascular: Normal rate, regular rhythm and normal heart sounds. Exam reveals no gallop.  No murmur heard. Pulmonary/Chest: Effort normal and breath sounds normal. No respiratory distress. She has no wheezes. She has no rales.  Abdominal: Soft. Normal appearance and bowel sounds are normal. There is no hepatosplenomegaly. There is no tenderness. Hernia confirmed negative in the right inguinal area and confirmed negative in the left inguinal area.  Genitourinary: Vagina normal and uterus normal. Pelvic exam was performed with patient supine. There is no rash or tenderness on the right labia. There is no rash or tenderness on the left labia. Uterus is not deviated, not enlarged, not fixed and not tender. Cervix exhibits no motion tenderness, no discharge and no friability. Right adnexum displays no mass, no tenderness and no fullness. Left adnexum displays no mass, no tenderness and no fullness. No erythema, tenderness or bleeding in the vagina. No foreign body in the vagina. No signs  of injury around the vagina. No vaginal discharge found.  Musculoskeletal: Normal range of motion. She exhibits no edema or deformity.  Lymphadenopathy:    She has no cervical adenopathy. No inguinal adenopathy noted on the right or left side.  Neurological: She is alert and oriented to person, place, and time.  Skin: Skin is warm  and dry. She is not diaphoretic. No pallor.  Psychiatric: She has a normal mood and affect. Her behavior is normal. Judgment and thought content normal.  Vitals reviewed.   Lab Results  Component Value Date   WBC 9.3 12/26/2017   HGB 14.1 12/26/2017   HCT 42.2 12/26/2017   PLT 518.0 (H) 12/26/2017   GLUCOSE 94 12/26/2017   CHOL 242 (H) 12/26/2017   TRIG 109.0 12/26/2017   HDL 75.00 12/26/2017   LDLDIRECT 123.1 03/21/2013   LDLCALC 145 (H) 12/26/2017   ALT 84 (H) 12/26/2017   AST 55 (H) 12/26/2017   NA 137 12/26/2017   K 4.0 12/26/2017   CL 101 12/26/2017   CREATININE 0.64 12/26/2017   BUN 16 12/26/2017   CO2 29 12/26/2017   TSH 1.99 04/14/2016    No results found.  Assessment & Plan:   Jennifer Mcknight was seen today for annual exam.  Diagnoses and all orders for this visit:  Routine general medical examination at a health care facility- Exam completed, labs reviewed, she refused a flu vaccine, her mammogram is up-to-date, Pap collected and sent today, patient education material was given. -     Lipid panel; Future  Thrombocytosis ([employer])- Her platelet count is a little higher than it was last time.  Her other cell lines are normal so this is probably benign.  I have asked her to come back to be screened for vitamin B12 and iron deficiency. -     CBC with Differential/Platelet; Future -     Comprehensive metabolic panel; Future -     IBC panel; Future -     Vitamin B12; Future -     Ferritin; Future -     Folate; Future  Cervical cancer screening -     Cytology - PAP; Future  Elevated LFTs- She has mildly elevated LFTs but is asymptomatic.  I have asked her to undergo an ultrasound to see if she has fatty liver disease.  I have also asked her to return to the lab to be screened for viral hepatitis. -     Cancel: US Abdomen Limited RUQ; Future -     Hepatitis A antibody, total; Future -     Hepatitis B core antibody, total; Future -     Hepatitis B surface  antibody,qualitative; Future -     Hepatitis C antibody; Future -     US Abdomen Limited RUQ; Future   I am having Jennifer Mcknight maintain her Levomilnacipran HCl ER.  No orders of the defined types were placed in this encounter.    Follow-up: Return if symptoms worsen or fail to improve.  Scarlette Calico, MD

## 2017-12-27 LAB — HM PAP SMEAR

## 2017-12-28 ENCOUNTER — Encounter: Payer: Self-pay | Admitting: Internal Medicine

## 2017-12-28 LAB — CYTOLOGY - PAP
Chlamydia: NEGATIVE
DIAGNOSIS: NEGATIVE
HPV: NOT DETECTED
Neisseria Gonorrhea: NEGATIVE

## 2018-01-03 ENCOUNTER — Telehealth: Payer: Self-pay | Admitting: Internal Medicine

## 2018-01-03 NOTE — Telephone Encounter (Signed)
Called pt with lab results - read patient my chart message. Pt stated understanding and will go to the lab for additional lab work and will call GI back to schedule [place].

## 2018-01-03 NOTE — Telephone Encounter (Signed)
Copied from [place] [phone]. Topic: Quick Communication - See Telephone Encounter >> Jan 03, 2018  9:09 AM Vernona Rieger wrote: CRM for notification. See Telephone encounter for: 01/03/18.  Patient said that [facility] called her and said that Dr Ronnald Ramp ordered her an " Abdominal Ultra Sound " and she does not know why. Please call patient.

## 2018-01-10 ENCOUNTER — Ambulatory Visit
Admission: RE | Admit: 2018-01-10 | Discharge: 2018-01-10 | Disposition: A | Discharge status: Home / Self Care | Payer: BLUE CROSS/BLUE SHIELD | Source: Ambulatory Visit | Attending: Internal Medicine | Admitting: Internal Medicine

## 2018-01-10 ENCOUNTER — Other Ambulatory Visit (INDEPENDENT_AMBULATORY_CARE_PROVIDER_SITE_OTHER): Payer: BLUE CROSS/BLUE SHIELD

## 2018-01-10 ENCOUNTER — Encounter: Payer: Self-pay | Admitting: Internal Medicine

## 2018-01-10 DIAGNOSIS — R945 Abnormal results of liver function studies: Principal | ICD-10-CM

## 2018-01-10 DIAGNOSIS — R7989 Other specified abnormal findings of blood chemistry: Secondary | ICD-10-CM

## 2018-01-10 DIAGNOSIS — D473 Essential (hemorrhagic) thrombocythemia: Secondary | ICD-10-CM | POA: Diagnosis not present

## 2018-01-10 DIAGNOSIS — D75839 Thrombocytosis, unspecified: Secondary | ICD-10-CM

## 2018-01-10 LAB — FOLATE

## 2018-01-10 LAB — FERRITIN: FERRITIN: 16.5 ng/mL (ref 10.0–291.0)

## 2018-01-10 LAB — IBC PANEL
IRON: 63 ug/dL (ref 42–145)
Saturation Ratios: 14 % — ABNORMAL LOW (ref 20.0–50.0)
TRANSFERRIN: 321 mg/dL (ref 212.0–360.0)

## 2018-01-10 LAB — VITAMIN B12: VITAMIN B 12: 411 pg/mL (ref 211–911)

## 2018-01-11 LAB — HEPATITIS B CORE ANTIBODY, TOTAL: Hep B Core Total Ab: NONREACTIVE

## 2018-01-11 LAB — HEPATITIS C ANTIBODY
Hepatitis C Ab: NONREACTIVE
SIGNAL TO CUT-OFF: 0.02 (ref <1.00)

## 2018-01-11 LAB — HEPATITIS A ANTIBODY, TOTAL: Hepatitis A AB,Total: NONREACTIVE

## 2018-01-11 LAB — HEPATITIS B SURFACE ANTIBODY,QUALITATIVE: Hep B S Ab: NONREACTIVE

## 2018-01-12 ENCOUNTER — Encounter: Payer: Self-pay | Admitting: Internal Medicine

## 2018-03-20 ENCOUNTER — Other Ambulatory Visit: Payer: Self-pay | Admitting: Internal Medicine

## 2018-03-20 DIAGNOSIS — F329 Major depressive disorder, single episode, unspecified: Secondary | ICD-10-CM

## 2018-09-05 DIAGNOSIS — L820 Inflamed seborrheic keratosis: Secondary | ICD-10-CM | POA: Diagnosis not present

## 2018-09-18 ENCOUNTER — Ambulatory Visit (INDEPENDENT_AMBULATORY_CARE_PROVIDER_SITE_OTHER): Payer: BLUE CROSS/BLUE SHIELD | Admitting: Internal Medicine

## 2018-09-18 ENCOUNTER — Encounter: Payer: Self-pay | Admitting: Internal Medicine

## 2018-09-18 ENCOUNTER — Other Ambulatory Visit (INDEPENDENT_AMBULATORY_CARE_PROVIDER_SITE_OTHER): Payer: BLUE CROSS/BLUE SHIELD

## 2018-09-18 ENCOUNTER — Other Ambulatory Visit: Payer: Self-pay

## 2018-09-18 VITALS — BP 144/88 | HR 90 | Temp 98.0°F | Resp 16 | Ht 67.0 in | Wt 184.2 lb

## 2018-09-18 DIAGNOSIS — I10 Essential (primary) hypertension: Secondary | ICD-10-CM

## 2018-09-18 DIAGNOSIS — R7989 Other specified abnormal findings of blood chemistry: Secondary | ICD-10-CM

## 2018-09-18 DIAGNOSIS — F329 Major depressive disorder, single episode, unspecified: Secondary | ICD-10-CM

## 2018-09-18 DIAGNOSIS — R945 Abnormal results of liver function studies: Secondary | ICD-10-CM

## 2018-09-18 DIAGNOSIS — D75839 Thrombocytosis, unspecified: Secondary | ICD-10-CM

## 2018-09-18 DIAGNOSIS — D473 Essential (hemorrhagic) thrombocythemia: Secondary | ICD-10-CM

## 2018-09-18 LAB — URINALYSIS, ROUTINE W REFLEX MICROSCOPIC
Bilirubin Urine: NEGATIVE
Ketones, ur: NEGATIVE
Leukocytes,Ua: NEGATIVE
Nitrite: NEGATIVE
Specific Gravity, Urine: 1.025 (ref 1.000–1.030)
Total Protein, Urine: 30 — AB
Urine Glucose: NEGATIVE
Urobilinogen, UA: 0.2 (ref 0.0–1.0)
pH: 5.5 (ref 5.0–8.0)

## 2018-09-18 LAB — CBC WITH DIFFERENTIAL/PLATELET
Basophils Absolute: 0.1 10*3/uL (ref 0.0–0.1)
Basophils Relative: 1 % (ref 0.0–3.0)
Eosinophils Absolute: 0.3 10*3/uL (ref 0.0–0.7)
Eosinophils Relative: 2.1 % (ref 0.0–5.0)
HCT: 45.6 % (ref 36.0–46.0)
Hemoglobin: 15.1 g/dL — ABNORMAL HIGH (ref 12.0–15.0)
Lymphocytes Relative: 36.2 % (ref 12.0–46.0)
Lymphs Abs: 5 10*3/uL — ABNORMAL HIGH (ref 0.7–4.0)
MCHC: 33.2 g/dL (ref 30.0–36.0)
MCV: 88.8 fl (ref 78.0–100.0)
Monocytes Absolute: 0.7 10*3/uL (ref 0.1–1.0)
Monocytes Relative: 5.2 % (ref 3.0–12.0)
Neutro Abs: 7.6 10*3/uL (ref 1.4–7.7)
Neutrophils Relative %: 55.5 % (ref 43.0–77.0)
Platelets: 526 10*3/uL — ABNORMAL HIGH (ref 150.0–400.0)
RBC: 5.13 Mil/uL — ABNORMAL HIGH (ref 3.87–5.11)
RDW: 13.5 % (ref 11.5–15.5)
WBC: 13.7 10*3/uL — ABNORMAL HIGH (ref 4.0–10.5)

## 2018-09-18 LAB — HEPATIC FUNCTION PANEL
ALT: 76 U/L — ABNORMAL HIGH (ref 0–35)
AST: 36 U/L (ref 0–37)
Albumin: 5 g/dL (ref 3.5–5.2)
Alkaline Phosphatase: 69 U/L (ref 39–117)
Bilirubin, Direct: 0.1 mg/dL (ref 0.0–0.3)
Total Bilirubin: 0.5 mg/dL (ref 0.2–1.2)
Total Protein: 8.6 g/dL — ABNORMAL HIGH (ref 6.0–8.3)

## 2018-09-18 LAB — BASIC METABOLIC PANEL
BUN: 14 mg/dL (ref 6–23)
CO2: 26 mEq/L (ref 19–32)
Calcium: 10.2 mg/dL (ref 8.4–10.5)
Chloride: 101 mEq/L (ref 96–112)
Creatinine, Ser: 0.65 mg/dL (ref 0.40–1.20)
GFR: 97.8 mL/min (ref >60.00)
Glucose, Bld: 100 mg/dL — ABNORMAL HIGH (ref 70–99)
Potassium: 4.3 mEq/L (ref 3.5–5.1)
Sodium: 137 mEq/L (ref 135–145)

## 2018-09-18 LAB — FERRITIN: Ferritin: 36.1 ng/mL (ref 10.0–291.0)

## 2018-09-18 LAB — IBC PANEL
Iron: 128 ug/dL (ref 42–145)
Saturation Ratios: 27.4 % (ref 20.0–50.0)
Transferrin: 334 mg/dL (ref 212.0–360.0)

## 2018-09-18 LAB — VITAMIN D 25 HYDROXY (VIT D DEFICIENCY, FRACTURES): VITD: 41.89 ng/mL (ref 30.00–100.00)

## 2018-09-18 LAB — TSH: TSH: 2.32 u[IU]/mL (ref 0.35–4.50)

## 2018-09-18 MED ORDER — FETZIMA 80 MG PO CP24: 1.0000 | ORAL_CAPSULE | Freq: Every day | ORAL | 1 refills | Status: DC

## 2018-09-18 MED ORDER — VIIBRYD STARTER PACK 10 & 20 MG PO KIT: 1.0000 | PACK | Freq: Every day | ORAL | 0 refills | Status: DC

## 2018-09-18 NOTE — Progress Notes (Signed)
Subjective:  Patient ID: Jennifer Mcknight, female    DOB: 01-12-1971  Age: 48 y.o. MRN: [id number]  CC: Hypertension and Depression   HPI [name] presents for f/up - She tells me that she is not doing well emotionally.  She tells me she is sleeping too much, she is overeating, and she complains of anxiety and anhedonia.  She does not feel worthless, helpless, or hopeless.  She denies SI or HI.  She is taking Fetzima and wants to continue taking it.  Outpatient Medications Prior to Visit  Medication Sig Dispense Refill  . FETZIMA 80 MG CP24 TAKE 1 CAPSULE BY MOUTH DAILY 90 capsule 1   No facility-administered medications prior to visit.     ROS Review of Systems  Constitutional: Positive for appetite change and unexpected weight change (wt gain). Negative for diaphoresis and fatigue.  HENT: Negative.  Negative for sore throat.   Eyes: Negative for visual disturbance.  Respiratory: Negative for cough, chest tightness, shortness of breath and wheezing.   Cardiovascular: Negative for chest pain, palpitations and leg swelling.  Gastrointestinal: Negative for abdominal pain, constipation, diarrhea, nausea and vomiting.  Endocrine: Negative.   Genitourinary: Negative.  Negative for difficulty urinating.  Musculoskeletal: Negative.  Negative for arthralgias and myalgias.  Skin: Negative.  Negative for color change and pallor.  Neurological: Negative.  Negative for dizziness, weakness and headaches.  Hematological: Negative for adenopathy. Does not bruise/bleed easily.  Psychiatric/Behavioral: Positive for dysphoric mood and sleep disturbance. Negative for behavioral problems, confusion, self-injury and suicidal ideas. The patient is nervous/anxious. The patient is not hyperactive.     Objective:  BP (!) 144/88 (BP Location: Left Arm, Patient Position: Sitting, Cuff Size: Normal)   Pulse 90   Temp 98 F (36.7 C) (Oral)   Resp 16   Ht 5' 7"  (1.702 m)   Wt 184 lb 4 oz (83.6 kg)   LMP  09/05/2018   SpO2 97%   BMI 28.86 kg/m   BP Readings from Last 3 Encounters:  09/18/18 (!) 144/88  12/26/17 138/80  03/02/17 130/80    Wt Readings from Last 3 Encounters:  09/18/18 184 lb 4 oz (83.6 kg)  12/26/17 162 lb (73.5 kg)  03/02/17 194 lb (88 kg)    Physical Exam Vitals signs reviewed.  HENT:     Nose: Nose normal.     Mouth/Throat:     Mouth: Mucous membranes are moist.     Pharynx: No oropharyngeal exudate.  Eyes:     General: No scleral icterus.    Conjunctiva/sclera: Conjunctivae normal.  Neck:     Musculoskeletal: Normal range of motion. No neck rigidity.  Cardiovascular:     Rate and Rhythm: Normal rate and regular rhythm.     Heart sounds: No murmur. No gallop.   Pulmonary:     Effort: Pulmonary effort is normal.     Breath sounds: No stridor. No wheezing, rhonchi or rales.  Abdominal:     General: Abdomen is protuberant. There is no distension.     Palpations: There is no hepatomegaly or splenomegaly.     Tenderness: There is no abdominal tenderness.  Musculoskeletal: Normal range of motion.     Right lower leg: No edema.     Left lower leg: No edema.  Lymphadenopathy:     Cervical: No cervical adenopathy.  Skin:    General: Skin is warm and dry.     Coloration: Skin is not pale.  Neurological:     General: No  focal deficit present.     Mental Status: She is alert.  Psychiatric:        Attention and Perception: Attention and perception normal.        Mood and Affect: Mood is anxious and depressed. Affect is angry and tearful.        Speech: Speech normal.        Behavior: Behavior normal. Behavior is not agitated, slowed or withdrawn. Behavior is cooperative.        Thought Content: Thought content normal. Thought content is not paranoid or delusional. Thought content does not include homicidal or suicidal ideation.        Cognition and Memory: Cognition normal.        Judgment: Judgment normal.     Lab Results  Component Value Date   WBC  13.7 (H) 09/18/2018   HGB 15.1 (H) 09/18/2018   HCT 45.6 09/18/2018   PLT 526.0 (H) 09/18/2018   GLUCOSE 100 (H) 09/18/2018   CHOL 242 (H) 12/26/2017   TRIG 109.0 12/26/2017   HDL 75.00 12/26/2017   LDLDIRECT 123.1 03/21/2013   LDLCALC 145 (H) 12/26/2017   ALT 76 (H) 09/18/2018   AST 36 09/18/2018   NA 137 09/18/2018   K 4.3 09/18/2018   CL 101 09/18/2018   CREATININE 0.65 09/18/2018   BUN 14 09/18/2018   CO2 26 09/18/2018   TSH 2.32 09/18/2018    US Abdomen Limited Ruq  Result Date: 01/10/2018 CLINICAL DATA:  Elevated liver function tests. EXAM: ULTRASOUND ABDOMEN LIMITED RIGHT UPPER QUADRANT COMPARISON:  None. FINDINGS: Gallbladder: 8 mm calculus is noted. No gallbladder Glaab thickening or pericholecystic fluid is noted. No sonographic Murphy's sign is noted. Common bile duct: Diameter: 5 mm which is within normal limits. Liver: No focal lesion identified. Within normal limits in parenchymal echogenicity. Portal vein is patent on color Doppler imaging with normal direction of blood flow towards the liver. IMPRESSION: Cholelithiasis without inflammation. No other abnormality seen in the right upper quadrant of the abdomen. Electronically Signed   By: Marijo Conception, M.D.   On: 01/10/2018 12:57    Assessment & Plan:   Tessah was seen today for hypertension and depression.  Diagnoses and all orders for this visit:  Thrombocytosis ([place])- Her platelet count is climbing and she has developed an elevated total protein level and an elevated white cell count.  I have asked her to see hematology to evaluate this further. -     CBC with Differential/Platelet; Future -     IBC panel; Future -     Ferritin; Future -     Ambulatory referral to Hematology  Elevated LFTs- Her LFTs are lower but still mildly elevated.  An ultrasound done 8 months ago was negative for abnormalities in the texture of the liver.  She tells me she does not drink alcohol.  I will screen her for hepatitis B.  This  is likely mild NASH.  If her LFTs remain elevated then will consider starting pioglitazone. -     Hepatic function panel; Future -     Hepatitis B surface antigen; Future  Major depression, chronic- I have asked her to add Viibryd to [place]. -     Levomilnacipran HCl ER ([place]) 80 MG CP24; Take 1 capsule by mouth daily. -     Vilazodone HCl (VIIBRYD STARTER PACK) 10 & 20 MG KIT; Take 1 tablet by mouth daily.  Essential hypertension- She has developed stage I hypertension.  Her labs are  negative for secondary causes or endorgan damage.  She is not willing to start an antihypertensive at this time.  She will work on her lifestyle modifications. -     Urinalysis, Routine w reflex microscopic; Future -     TSH; Future -     VITAMIN D 25 Hydroxy (Vit-D Deficiency, Fractures); Future -     Basic metabolic panel; Future   I have changed [facility]. I am also having her start on [employer].  Meds ordered this encounter  Medications  . Levomilnacipran HCl ER (FETZIMA) 80 MG CP24    Sig: Take 1 capsule by mouth daily.    Dispense:  90 capsule    Refill:  1  . Vilazodone HCl (VIIBRYD STARTER PACK) 10 & 20 MG KIT    Sig: Take 1 tablet by mouth daily.    Dispense:  1 kit    Refill:  0     Follow-up: Return in about 6 months (around 03/21/2019).  Scarlette Calico, MD

## 2018-09-18 NOTE — Patient Instructions (Signed)

## 2018-09-19 ENCOUNTER — Encounter: Payer: Self-pay | Admitting: Internal Medicine

## 2018-09-19 LAB — HEPATITIS B SURFACE ANTIGEN: Hepatitis B Surface Ag: NONREACTIVE

## 2018-09-20 ENCOUNTER — Encounter: Payer: Self-pay | Admitting: Internal Medicine

## 2018-09-28 ENCOUNTER — Telehealth: Payer: Self-pay | Admitting: Hematology

## 2018-09-28 NOTE — Telephone Encounter (Signed)
Pt called and I read her the lab results. Pt stated understanding.   Pt is also requesting go to the Viibryd 40 mg tablets if this can be sent in to [facility] in [place].

## 2018-09-28 NOTE — Telephone Encounter (Signed)
Spoke with patient to confirm new patient appt 8/17 at 0830. Appointment latter mailed to patient regarding appt date/time/location per pt request

## 2018-09-29 ENCOUNTER — Other Ambulatory Visit: Payer: Self-pay | Admitting: Internal Medicine

## 2018-09-29 DIAGNOSIS — F329 Major depressive disorder, single episode, unspecified: Secondary | ICD-10-CM

## 2018-09-29 MED ORDER — VIIBRYD 40 MG PO TABS: 40.0000 mg | ORAL_TABLET | Freq: Every day | ORAL | 1 refills | Status: DC

## 2018-10-05 ENCOUNTER — Other Ambulatory Visit: Payer: Self-pay | Admitting: Hematology

## 2018-10-05 DIAGNOSIS — D72829 Elevated white blood cell count, unspecified: Secondary | ICD-10-CM | POA: Insufficient documentation

## 2018-10-05 DIAGNOSIS — D582 Other hemoglobinopathies: Secondary | ICD-10-CM

## 2018-10-07 NOTE — Progress Notes (Signed)
[facility] NOTE  Patient Care Team: Janith Lima, MD as PCP - General  HEME/ONC OVERVIEW: 1. Thrombocytosis -Plts ~500k since 2016  2. Intermittent leukocytosis -WBC ~12-13k w/ normal differentials  ASSESSMENT & PLAN:   Thrombocytosis -I reviewed the patient's records in detail, including PCP clinic notes, lab studies and imaging results -In summary, patient has had chronic mild leukocytosis with platelet count ~500k since 2016.  Abdominal ultrasound dated 12/2017 showed cholelithiasis, but no evidence of liver abnormality. -I reviewed the lab and imaging results in detail with the patient -Plt count 490k today, stable -I personally reviewed the patient's peripheral blood smear today.  The red blood cells were of normal morphology.  There was no schistocytosis.  The white blood cells were of normal morphology. There were no peripheral circulating blasts. The platelets were of normal size and I verified that there were no platelet clumping. -In light of the chronic thrombocytopenia with concurrent intermittent leukocytosis and polycythemia, I have ordered lab studies to rule out myeloproliferative neoplasm, including ET, PV and CML -I also counseled the patient on the importance of keeping up-to-date with screening programs for early cancer detection, including MMG and colonoscopy -Pending the work-up above, we will determine further testing indicated  Leukocytosis -Review of CBCs dating back to 2016 showed WBC ~12-13k with normal diff -Clinically, patient denies any constitutional symptoms or symptoms of infection -WBC 10.0k today, improving  -See MPN work-up as outlined above  -We will monitor for now  Elevated hemoglobin -Hgb flucuates between 14 and 15 since 2016 -In addition to MPN, other ddx includes OSA  -Hgb 13.8 today, improving  -In addition to MPN work-up, I have also ordered iron profile to rule out iron metabolism disorder  -Patient has CPAP  at home but does not wear it consistently, and has not had it re-fitted in several years -I encouraged the patient to contact her PCP for repeat sleep study as needed, and counseled her on the importance of compliance with CPAP  -No indication for phlebotomy at this time  Orders Placed This Encounter  Procedures  . CBC with Differential (Cancer Center Only)    Standing Status:   Future    Standing Expiration Date:   11/12/2019  . CMP ([place] only)    Standing Status:   Future    Standing Expiration Date:   11/12/2019  . Save Smear ([facility])    Standing Status:   Future    Standing Expiration Date:   10/08/2019  . Lactate dehydrogenase    Standing Status:   Future    Standing Expiration Date:   11/12/2019   All questions were answered. The patient knows to call the clinic with any problems, questions or concerns.  Return in 1 month for labs and clinic follow-up.  Tish Men, MD 10/08/2018 10:00 AM  CHIEF COMPLAINTS/PURPOSE OF CONSULTATION:  "I am told my platelet count is high"  HISTORY OF PRESENTING ILLNESS:  Jennifer Mcknight 48 y.o. female is here because of persistent thrombocytosis.  She reports that she has mild intermittent low back for the past month, non-radiating, not associated with any other neurologic symptoms, and not triggered by any recent strenuous exertion or injury.  She has not yet discussed her symptoms with her PCP.  She has sleep apnea and CPAP at home, but she does not wear her CPAP consistently, and she has not had any follow-up sleep study in several years.  She denies any constitutional symptoms or symptoms of infection.  She recently started a new antidepressant.  She has history of remote smoking (1 pack every few month), and drinks only socially.  She denies any illicit drug use.  She denies any other complaint today.  MEDICAL HISTORY:  Past Medical History:  Diagnosis Date  . Depression     SURGICAL HISTORY: Past Surgical History:  Procedure Laterality  Date  . CESAREAN SECTION    . NASAL SINUS SURGERY    . TUBAL LIGATION      SOCIAL HISTORY: Social History   Socioeconomic History  . Marital status: Married    Spouse name: Not on file  . Number of children: Not on file  . Years of education: Not on file  . Highest education level: Not on file  Occupational History  . Not on file  Social Needs  . Financial resource strain: Not on file  . Food insecurity    Worry: Not on file    Inability: Not on file  . Transportation needs    Medical: Not on file    Non-medical: Not on file  Tobacco Use  . Smoking status: Never Smoker  . Smokeless tobacco: Never Used  Substance and Sexual Activity  . Alcohol use: Yes    Comment: rarely  . Drug use: No  . Sexual activity: Yes    Partners: Male    Birth control/protection: Surgical  Lifestyle  . Physical activity    Days per week: Not on file    Minutes per session: Not on file  . Stress: Not on file  Relationships  . Social Herbalist on phone: Not on file    Gets together: Not on file    Attends religious service: Not on file    Active member of club or organization: Not on file    Attends meetings of clubs or organizations: Not on file    Relationship status: Not on file  . Intimate partner violence    Fear of current or ex partner: Not on file    Emotionally abused: Not on file    Physically abused: Not on file    Forced sexual activity: Not on file  Other Topics Concern  . Not on file  Social History Narrative  . Not on file    FAMILY HISTORY: Family History  Problem Relation Age of Onset  . Diabetes Father   . Depression Father   . Alcohol abuse Other   . Drug abuse Other   . Depression Other   . Cancer Neg Hx   . Heart disease Neg Hx   . Early death Neg Hx   . Hyperlipidemia Neg Hx   . Hypertension Neg Hx   . Kidney disease Neg Hx   . Learning disabilities Neg Hx   . Stroke Neg Hx     ALLERGIES:  is allergic to other.  MEDICATIONS:   Current Outpatient Medications  Medication Sig Dispense Refill  . Levomilnacipran HCl ER (FETZIMA) 80 MG CP24 Take 1 capsule by mouth daily. 90 capsule 1  . Vilazodone HCl (VIIBRYD) 40 MG TABS Take 1 tablet (40 mg total) by mouth daily. 90 tablet 1   No current facility-administered medications for this visit.     REVIEW OF SYSTEMS:   Constitutional: ( - ) fevers, ( - )  chills , ( - ) night sweats Eyes: ( - ) blurriness of vision, ( - ) double vision, ( - ) watery eyes Ears, nose, mouth, throat, and face: ( - )  mucositis, ( - ) sore throat Respiratory: ( - ) cough, ( - ) dyspnea, ( - ) wheezes Cardiovascular: ( - ) palpitation, ( - ) chest discomfort, ( - ) lower extremity swelling Gastrointestinal:  ( - ) nausea, ( - ) heartburn, ( - ) change in bowel habits Skin: ( - ) abnormal skin rashes Lymphatics: ( - ) new lymphadenopathy, ( - ) easy bruising Neurological: ( - ) numbness, ( - ) tingling, ( - ) new weaknesses Behavioral/Psych: ( - ) mood change, ( - ) new changes  All other systems were reviewed with the patient and are negative.  PHYSICAL EXAMINATION: ECOG PERFORMANCE STATUS: 1 - Symptomatic but completely ambulatory  Vitals:   10/08/18 0910  BP: 140/81  Pulse: 87  Resp: 17  Temp: 97.7 F (36.5 C)  SpO2: 100%   Filed Weights   10/08/18 0910  Weight: 189 lb (85.7 kg)    GENERAL: alert, no distress and comfortable SKIN: skin color, texture, turgor are normal, no rashes or significant lesions EYES: conjunctiva are pink and non-injected, sclera clear OROPHARYNX: no exudate, no erythema; lips, buccal mucosa, and tongue normal  NECK: supple, non-tender LYMPH:  no palpable lymphadenopathy in the cervical LUNGS: clear to auscultation with normal breathing effort HEART: regular rate & rhythm, no murmurs, no lower extremity edema ABDOMEN: soft, non-tender, non-distended, normal bowel sounds Musculoskeletal: no cyanosis of digits and no clubbing  PSYCH: alert & oriented  x 3, fluent speech NEURO: no focal motor/sensory deficits  LABORATORY DATA:  I have reviewed the data as listed Lab Results  Component Value Date   WBC 10.0 10/08/2018   HGB 13.8 10/08/2018   HCT 42.0 10/08/2018   MCV 89.7 10/08/2018   PLT 490 (H) 10/08/2018   Lab Results  Component Value Date   NA 139 10/08/2018   K 3.9 10/08/2018   CL 102 10/08/2018   CO2 28 10/08/2018    PATHOLOGY: I personally reviewed the patient's peripheral blood smear today.  The red blood cells were of normal morphology.  There was no schistocytosis.  The white blood cells were of normal morphology. There were no peripheral circulating blasts. The platelets were of normal size and I verified that there were no platelet clumping.

## 2018-10-08 ENCOUNTER — Inpatient Hospital Stay: Payer: BLUE CROSS/BLUE SHIELD | Attending: Hematology | Admitting: Hematology

## 2018-10-08 ENCOUNTER — Encounter: Payer: Self-pay | Admitting: Hematology

## 2018-10-08 ENCOUNTER — Other Ambulatory Visit: Payer: Self-pay

## 2018-10-08 ENCOUNTER — Telehealth: Payer: Self-pay | Admitting: Hematology

## 2018-10-08 ENCOUNTER — Inpatient Hospital Stay: Payer: BLUE CROSS/BLUE SHIELD

## 2018-10-08 VITALS — BP 140/81 | HR 87 | Temp 97.7°F | Resp 17 | Ht 67.0 in | Wt 189.0 lb

## 2018-10-08 DIAGNOSIS — D473 Essential (hemorrhagic) thrombocythemia: Secondary | ICD-10-CM | POA: Diagnosis not present

## 2018-10-08 DIAGNOSIS — Z813 Family history of other psychoactive substance abuse and dependence: Secondary | ICD-10-CM | POA: Diagnosis not present

## 2018-10-08 DIAGNOSIS — Z79899 Other long term (current) drug therapy: Secondary | ICD-10-CM | POA: Diagnosis not present

## 2018-10-08 DIAGNOSIS — D582 Other hemoglobinopathies: Secondary | ICD-10-CM

## 2018-10-08 DIAGNOSIS — R7989 Other specified abnormal findings of blood chemistry: Secondary | ICD-10-CM

## 2018-10-08 DIAGNOSIS — Z87891 Personal history of nicotine dependence: Secondary | ICD-10-CM | POA: Insufficient documentation

## 2018-10-08 DIAGNOSIS — D751 Secondary polycythemia: Secondary | ICD-10-CM | POA: Insufficient documentation

## 2018-10-08 DIAGNOSIS — D696 Thrombocytopenia, unspecified: Secondary | ICD-10-CM | POA: Insufficient documentation

## 2018-10-08 DIAGNOSIS — Z833 Family history of diabetes mellitus: Secondary | ICD-10-CM | POA: Insufficient documentation

## 2018-10-08 DIAGNOSIS — D72829 Elevated white blood cell count, unspecified: Secondary | ICD-10-CM | POA: Insufficient documentation

## 2018-10-08 DIAGNOSIS — D75839 Thrombocytosis, unspecified: Secondary | ICD-10-CM

## 2018-10-08 DIAGNOSIS — Z811 Family history of alcohol abuse and dependence: Secondary | ICD-10-CM

## 2018-10-08 DIAGNOSIS — Z818 Family history of other mental and behavioral disorders: Secondary | ICD-10-CM | POA: Diagnosis not present

## 2018-10-08 LAB — CMP (CANCER CENTER ONLY)
ALT: 38 U/L (ref 0–44)
AST: 30 U/L (ref 15–41)
Albumin: 4.5 g/dL (ref 3.5–5.0)
Alkaline Phosphatase: 54 U/L (ref 38–126)
Anion gap: 9 (ref 5–15)
BUN: 13 mg/dL (ref 6–20)
CO2: 28 mmol/L (ref 22–32)
Calcium: 9.2 mg/dL (ref 8.9–10.3)
Chloride: 102 mmol/L (ref 98–111)
Creatinine: 0.7 mg/dL (ref 0.44–1.00)
GFR, Est AFR Am: >60 mL/min (ref >60)
GFR, Estimated: >60 mL/min (ref >60)
Glucose, Bld: 87 mg/dL (ref 70–99)
Potassium: 3.9 mmol/L (ref 3.5–5.1)
Sodium: 139 mmol/L (ref 135–145)
Total Bilirubin: 0.4 mg/dL (ref 0.3–1.2)
Total Protein: 7.3 g/dL (ref 6.5–8.1)

## 2018-10-08 LAB — CBC WITH DIFFERENTIAL (CANCER CENTER ONLY)
Abs Immature Granulocytes: 0.03 10*3/uL (ref 0.00–0.07)
Basophils Absolute: 0.1 10*3/uL (ref 0.0–0.1)
Basophils Relative: 1 %
Eosinophils Absolute: 0.3 10*3/uL (ref 0.0–0.5)
Eosinophils Relative: 3 %
HCT: 42 % (ref 36.0–46.0)
Hemoglobin: 13.8 g/dL (ref 12.0–15.0)
Immature Granulocytes: 0 %
Lymphocytes Relative: 35 %
Lymphs Abs: 3.5 10*3/uL (ref 0.7–4.0)
MCH: 29.5 pg (ref 26.0–34.0)
MCHC: 32.9 g/dL (ref 30.0–36.0)
MCV: 89.7 fL (ref 80.0–100.0)
Monocytes Absolute: 0.6 10*3/uL (ref 0.1–1.0)
Monocytes Relative: 6 %
Neutro Abs: 5.5 10*3/uL (ref 1.7–7.7)
Neutrophils Relative %: 55 %
Platelet Count: 490 10*3/uL — ABNORMAL HIGH (ref 150–400)
RBC: 4.68 MIL/uL (ref 3.87–5.11)
RDW: 12.9 % (ref 11.5–15.5)
WBC Count: 10 10*3/uL (ref 4.0–10.5)
nRBC: 0 % (ref 0.0–0.2)

## 2018-10-08 LAB — SAVE SMEAR(SSMR), FOR PROVIDER SLIDE REVIEW

## 2018-10-08 LAB — FERRITIN: Ferritin: 19 ng/mL (ref 11–307)

## 2018-10-08 LAB — IRON AND TIBC
Iron: 135 ug/dL (ref 41–142)
Saturation Ratios: 35 % (ref 21–57)
TIBC: 383 ug/dL (ref 236–444)
UIBC: 247 ug/dL (ref 120–384)

## 2018-10-08 LAB — C-REACTIVE PROTEIN: CRP: <0.8 mg/dL (ref <1.0)

## 2018-10-08 LAB — LACTATE DEHYDROGENASE: LDH: 193 U/L — ABNORMAL HIGH (ref 98–192)

## 2018-10-08 LAB — SEDIMENTATION RATE: Sed Rate: 24 mm/hr — ABNORMAL HIGH (ref 0–22)

## 2018-10-19 ENCOUNTER — Telehealth: Payer: Self-pay | Admitting: Hematology

## 2018-10-19 NOTE — Telephone Encounter (Signed)
Called and spoke with patient regarding moving appointments from 9/17 to 9/15      Due to provider out of office

## 2018-10-23 ENCOUNTER — Other Ambulatory Visit: Payer: Self-pay | Admitting: Hematology

## 2018-10-26 LAB — JAK2 (INCLUDING V617F AND EXON 12), MPL,& CALR W/RFL MPN PANEL (NGS)

## 2018-10-26 LAB — BCR ABL1 FISH (GENPATH)

## 2018-11-06 ENCOUNTER — Inpatient Hospital Stay: Payer: BLUE CROSS/BLUE SHIELD | Attending: Hematology

## 2018-11-06 ENCOUNTER — Ambulatory Visit (HOSPITAL_BASED_OUTPATIENT_CLINIC_OR_DEPARTMENT_OTHER)
Admission: RE | Admit: 2018-11-06 | Discharge: 2018-11-06 | Disposition: A | Discharge status: Home / Self Care | Payer: BLUE CROSS/BLUE SHIELD | Source: Ambulatory Visit | Attending: Hematology | Admitting: Hematology

## 2018-11-06 ENCOUNTER — Other Ambulatory Visit: Payer: Self-pay

## 2018-11-06 ENCOUNTER — Encounter: Payer: Self-pay | Admitting: Hematology

## 2018-11-06 ENCOUNTER — Inpatient Hospital Stay (HOSPITAL_BASED_OUTPATIENT_CLINIC_OR_DEPARTMENT_OTHER): Payer: BLUE CROSS/BLUE SHIELD | Admitting: Hematology

## 2018-11-06 ENCOUNTER — Telehealth: Payer: Self-pay | Admitting: Hematology

## 2018-11-06 DIAGNOSIS — D473 Essential (hemorrhagic) thrombocythemia: Secondary | ICD-10-CM | POA: Diagnosis not present

## 2018-11-06 DIAGNOSIS — D72829 Elevated white blood cell count, unspecified: Secondary | ICD-10-CM | POA: Diagnosis not present

## 2018-11-06 DIAGNOSIS — R740 Nonspecific elevation of levels of transaminase and lactic acid dehydrogenase [LDH]: Secondary | ICD-10-CM | POA: Insufficient documentation

## 2018-11-06 DIAGNOSIS — M542 Cervicalgia: Secondary | ICD-10-CM | POA: Insufficient documentation

## 2018-11-06 DIAGNOSIS — R591 Generalized enlarged lymph nodes: Secondary | ICD-10-CM | POA: Insufficient documentation

## 2018-11-06 DIAGNOSIS — R634 Abnormal weight loss: Secondary | ICD-10-CM | POA: Diagnosis not present

## 2018-11-06 DIAGNOSIS — R7989 Other specified abnormal findings of blood chemistry: Secondary | ICD-10-CM | POA: Diagnosis not present

## 2018-11-06 DIAGNOSIS — D75839 Thrombocytosis, unspecified: Secondary | ICD-10-CM

## 2018-11-06 DIAGNOSIS — R7401 Elevation of levels of liver transaminase levels: Secondary | ICD-10-CM | POA: Insufficient documentation

## 2018-11-06 LAB — CBC WITH DIFFERENTIAL (CANCER CENTER ONLY)
Abs Immature Granulocytes: 0.02 10*3/uL (ref 0.00–0.07)
Basophils Absolute: 0.1 10*3/uL (ref 0.0–0.1)
Basophils Relative: 1 %
Eosinophils Absolute: 0.3 10*3/uL (ref 0.0–0.5)
Eosinophils Relative: 3 %
HCT: 44.3 % (ref 36.0–46.0)
Hemoglobin: 14.7 g/dL (ref 12.0–15.0)
Immature Granulocytes: 0 %
Lymphocytes Relative: 36 %
Lymphs Abs: 4.6 10*3/uL — ABNORMAL HIGH (ref 0.7–4.0)
MCH: 30.2 pg (ref 26.0–34.0)
MCHC: 33.2 g/dL (ref 30.0–36.0)
MCV: 91.2 fL (ref 80.0–100.0)
Monocytes Absolute: 0.7 10*3/uL (ref 0.1–1.0)
Monocytes Relative: 5 %
Neutro Abs: 7.1 10*3/uL (ref 1.7–7.7)
Neutrophils Relative %: 55 %
Platelet Count: 550 10*3/uL — ABNORMAL HIGH (ref 150–400)
RBC: 4.86 MIL/uL (ref 3.87–5.11)
RDW: 12.6 % (ref 11.5–15.5)
WBC Count: 12.8 10*3/uL — ABNORMAL HIGH (ref 4.0–10.5)
nRBC: 0 % (ref 0.0–0.2)

## 2018-11-06 LAB — CMP (CANCER CENTER ONLY)
ALT: 45 U/L — ABNORMAL HIGH (ref 0–44)
AST: 29 U/L (ref 15–41)
Albumin: 4.6 g/dL (ref 3.5–5.0)
Alkaline Phosphatase: 57 U/L (ref 38–126)
Anion gap: 9 (ref 5–15)
BUN: 13 mg/dL (ref 6–20)
CO2: 28 mmol/L (ref 22–32)
Calcium: 10.2 mg/dL (ref 8.9–10.3)
Chloride: 103 mmol/L (ref 98–111)
Creatinine: 0.79 mg/dL (ref 0.44–1.00)
GFR, Est AFR Am: >60 mL/min (ref >60)
GFR, Estimated: >60 mL/min (ref >60)
Glucose, Bld: 96 mg/dL (ref 70–99)
Potassium: 4.4 mmol/L (ref 3.5–5.1)
Sodium: 140 mmol/L (ref 135–145)
Total Bilirubin: 0.3 mg/dL (ref 0.3–1.2)
Total Protein: 7.7 g/dL (ref 6.5–8.1)

## 2018-11-06 LAB — SAVE SMEAR(SSMR), FOR PROVIDER SLIDE REVIEW

## 2018-11-06 NOTE — Telephone Encounter (Signed)
Appointments scheduled [facility] w/ date/time per 9/15 los

## 2018-11-06 NOTE — Progress Notes (Signed)
[place] OFFICE PROGRESS NOTE  Patient Care Team: Janith Lima, MD as PCP - General  HEME/ONC OVERVIEW: 1. Thrombocytosis -Most likely reactive  -Plts ~500k since 2016, w/ intermittent mild leukocytosis (WBC 12-13k)  BCR/ABL FISH negative   MPN NGS, including JAK2 and CAL-R, negative   ASSESSMENT & PLAN:   Thrombocytosis -Most likely reactive -Plts 550k today, slightly elevated since the last visit  -I discussed the recent molecular testing results in detail with the patient; in summary, BCR/ABL and MPN NGS were negative, ruling out myeloproliferative neoplasm, such as ET and CML -Patient is up-to-date for MMG and pelvic exam, and has not had any colon cancer screening; I instructed the patient to discuss with her PCP regarding colon cancer screening  -Given the patient's remote hx of tobacco use (1 pack/month x 2 years), I have ordered CXR to rule out any pulmonary abnormalities -I also counseled the patient on any concerning symptoms, such as unexplained persistent fever, weight loss, night sweats, or lymphadenopathy, for which she should contact the clinic ASAP for further evaluation   Leukocytosis -Possibly reactive -WBC 12.8k today, stable -Clinically, patient denies any symptoms of infection -I have ordered flow cytometry for the next visit to rule out any monoclonal population -As the patient has had chronic intermittent leukocytosis since at least 2015, this may be physiologic, and we will monitor it for now   Transaminitis  -Review of LFT's showed intermittent mild transaminitis -Possibly due to medications; patient denies any significant EtOH use or recent medication change  -I have ordered abdominal US to assess for any liver abnormality   Orders Placed This Encounter  Procedures  . DG Chest 2 View    Standing Status:   Future    Number of Occurrences:   1    Standing Expiration Date:   11/06/2019    Order Specific Question:   Reason for Exam  (SYMPTOM  OR DIAGNOSIS REQUIRED)    Answer:   Leukocytosis and thrombocytosis, remote hx of smoking    Order Specific Question:   Preferred imaging location?    Answer:   [facility] Specific Question:   Radiology Contrast Protocol - do NOT remove file path    Answer:   [email or web address]    Order Specific Question:   Is patient pregnant?    Answer:   No  . US Abdomen Complete    Standing Status:   Future    Standing Expiration Date:   11/06/2019    Order Specific Question:   Reason for Exam (SYMPTOM  OR DIAGNOSIS REQUIRED)    Answer:   Elevated LFT's    Order Specific Question:   Preferred imaging location?    Answer:   [facility]  . CBC with Differential (Cancer Center Only)    Standing Status:   Future    Standing Expiration Date:   12/11/2019  . CMP ([place] only)    Standing Status:   Future    Standing Expiration Date:   12/11/2019  . Save Smear (SSMR)    Standing Status:   Future    Standing Expiration Date:   11/06/2019  . Lactate dehydrogenase    Standing Status:   Future    Standing Expiration Date:   12/11/2019  . Flow Cytometry    Standing Status:   Future    Standing Expiration Date:   12/11/2019    All questions were answered. The patient knows to call the clinic  with any problems, questions or concerns. No barriers to learning was detected.  Return in 3 months for labs and clinic follow-up.   Jennifer Men, MD 11/06/2018 3:42 PM  CHIEF COMPLAINT: "I am doing well"  INTERVAL HISTORY: Jennifer Mcknight returns to clinic returns to clinic for follow-up of chronic thrombocytosis and leukocytosis.  Patient reports that she has had mild persistent pain in the neck and back for the past month, which she attributes to a muscle strain, and takes Tylenol as needed with relief.  She only drinks alcohol occasionally, but does not smoke or use illicit drugs.  She has a history of 1 pack/month of tobacco use,  intermittent, for about 2 years, but she quit many years ago.  There is no significant family history of cancer.  She otherwise feels well, and she denies any complaint today.  REVIEW OF SYSTEMS:   Constitutional: ( - ) fevers, ( - )  chills , ( - ) night sweats Eyes: ( - ) blurriness of vision, ( - ) double vision, ( - ) watery eyes Ears, nose, mouth, throat, and face: ( - ) mucositis, ( - ) sore throat Respiratory: ( - ) cough, ( - ) dyspnea, ( - ) wheezes Cardiovascular: ( - ) palpitation, ( - ) chest discomfort, ( - ) lower extremity swelling Gastrointestinal:  ( - ) nausea, ( - ) heartburn, ( - ) change in bowel habits Skin: ( - ) abnormal skin rashes Lymphatics: ( - ) new lymphadenopathy, ( - ) easy bruising Neurological: ( - ) numbness, ( - ) tingling, ( - ) new weaknesses Behavioral/Psych: ( - ) mood change, ( - ) new changes  All other systems were reviewed with the patient and are negative.  I have reviewed the past medical history, past surgical history, social history and family history with the patient and they are unchanged from previous note.  ALLERGIES:  is allergic to other.  MEDICATIONS:  Current Outpatient Medications  Medication Sig Dispense Refill  . Levomilnacipran HCl ER (FETZIMA) 80 MG CP24 Take 1 capsule by mouth daily. 90 capsule 1  . Vilazodone HCl (VIIBRYD) 40 MG TABS Take 1 tablet (40 mg total) by mouth daily. 90 tablet 1   No current facility-administered medications for this visit.     PHYSICAL EXAMINATION: ECOG PERFORMANCE STATUS: 0 - Asymptomatic  There were no vitals filed for this visit. There is no height or weight on file to calculate BMI.  There were no vitals filed for this visit.  GENERAL: alert, no distress and comfortable SKIN: skin color, texture, turgor are normal, no rashes or significant lesions EYES: conjunctiva are pink and non-injected, sclera clear OROPHARYNX: no exudate, no erythema; lips, buccal mucosa, and tongue normal   NECK: supple, non-tender LYMPH:  no palpable lymphadenopathy in the cervical LUNGS: clear to auscultation with normal breathing effort HEART: regular rate & rhythm and no murmurs and no lower extremity edema ABDOMEN: soft, non-tender, non-distended, normal bowel sounds Musculoskeletal: no cyanosis of digits and no clubbing  PSYCH: alert & oriented x 3, fluent speech NEURO: no focal motor/sensory deficits  LABORATORY DATA:  I have reviewed the data as listed    Component Value Date/Time   NA 140 11/06/2018 1418   K 4.4 11/06/2018 1418   CL 103 11/06/2018 1418   CO2 28 11/06/2018 1418   GLUCOSE 96 11/06/2018 1418   BUN 13 11/06/2018 1418   CREATININE 0.79 11/06/2018 1418   CALCIUM 10.2 11/06/2018 1418  PROT 7.7 11/06/2018 1418   ALBUMIN 4.6 11/06/2018 1418   AST 29 11/06/2018 1418   ALT 45 (H) 11/06/2018 1418   ALKPHOS 57 11/06/2018 1418   BILITOT 0.3 11/06/2018 1418   GFRNONAA >60 11/06/2018 1418   GFRAA >60 11/06/2018 1418    No results found for: SPEP, UPEP  Lab Results  Component Value Date   WBC 12.8 (H) 11/06/2018   NEUTROABS 7.1 11/06/2018   HGB 14.7 11/06/2018   HCT 44.3 11/06/2018   MCV 91.2 11/06/2018   PLT 550 (H) 11/06/2018      Chemistry      Component Value Date/Time   NA 140 11/06/2018 1418   K 4.4 11/06/2018 1418   CL 103 11/06/2018 1418   CO2 28 11/06/2018 1418   BUN 13 11/06/2018 1418   CREATININE 0.79 11/06/2018 1418      Component Value Date/Time   CALCIUM 10.2 11/06/2018 1418   ALKPHOS 57 11/06/2018 1418   AST 29 11/06/2018 1418   ALT 45 (H) 11/06/2018 1418   BILITOT 0.3 11/06/2018 1418       RADIOGRAPHIC STUDIES: I have personally reviewed the radiological images as listed below and agreed with the findings in the report. No results found.

## 2018-11-07 LAB — LACTATE DEHYDROGENASE: LDH: 192 U/L (ref 98–192)

## 2018-11-08 ENCOUNTER — Ambulatory Visit: Payer: BLUE CROSS/BLUE SHIELD | Admitting: Hematology

## 2018-11-08 ENCOUNTER — Other Ambulatory Visit: Payer: BLUE CROSS/BLUE SHIELD

## 2018-11-08 ENCOUNTER — Telehealth: Payer: Self-pay | Admitting: *Deleted

## 2018-11-08 NOTE — Telephone Encounter (Signed)
Notified pt of results.

## 2018-11-08 NOTE — Telephone Encounter (Signed)
-----   Message from Tish Men, MD sent at 11/07/2018  7:27 AM EDT ----- Jennifer Mcknight,  Can you let the patient know that her CXR was normal?Thanks.  [place] ----- Message ----- From: Interface, Rad Results In Sent: 11/07/2018   2:44 AM EDT To: Tish Men, MD

## 2018-11-09 ENCOUNTER — Encounter: Payer: Self-pay | Admitting: Hematology

## 2018-11-14 ENCOUNTER — Other Ambulatory Visit: Payer: Self-pay

## 2018-11-14 ENCOUNTER — Ambulatory Visit (HOSPITAL_BASED_OUTPATIENT_CLINIC_OR_DEPARTMENT_OTHER)
Admission: RE | Admit: 2018-11-14 | Discharge: 2018-11-14 | Disposition: A | Discharge status: Home / Self Care | Payer: BLUE CROSS/BLUE SHIELD | Source: Ambulatory Visit | Attending: Hematology | Admitting: Hematology

## 2018-11-14 DIAGNOSIS — R7401 Elevation of levels of liver transaminase levels: Secondary | ICD-10-CM

## 2018-11-14 DIAGNOSIS — R740 Nonspecific elevation of levels of transaminase and lactic acid dehydrogenase [LDH]: Secondary | ICD-10-CM | POA: Insufficient documentation

## 2018-11-14 DIAGNOSIS — K8020 Calculus of gallbladder without cholecystitis without obstruction: Secondary | ICD-10-CM | POA: Diagnosis not present

## 2018-11-16 ENCOUNTER — Telehealth: Payer: Self-pay | Admitting: *Deleted

## 2018-11-16 NOTE — Telephone Encounter (Signed)
Called Ms. Beazer to let her know that her abdominal US showed normal liver and spleen Her gallbladder has some stones. If she has any pain in the right upper quadrant, we can consider referring her to a surgeon for consideration of cholecystectomy.  Patient states she does not have any pain at this time.  Appreciative of the call

## 2018-11-16 NOTE — Telephone Encounter (Signed)
-----   Message from Tish Men, MD sent at 11/16/2018 11:20 AM EDT ----- Geraldo Pitter,  Can you let Ms. Schuman know that her abdominal US showed normal liver and spleen? Her gallbladder has some stones. If she has any pain in the right upper quadrant, we can consider referring her to a surgeon for consideration of cholecystectomy.  Thank you.  [place]  ----- Message ----- From: Interface, Rad Results In Sent: 11/14/2018   5:19 PM EDT To: Tish Men, MD

## 2018-11-20 ENCOUNTER — Telehealth: Payer: Self-pay | Admitting: *Deleted

## 2018-11-20 NOTE — Telephone Encounter (Signed)
Spoke with pt today.  Pt expressed that she would like to have a referral to surgeon for cholecystectomy.   Pt's    Phone     [phone].

## 2018-11-21 ENCOUNTER — Other Ambulatory Visit: Payer: Self-pay | Admitting: Hematology

## 2018-11-21 DIAGNOSIS — K828 Other specified diseases of gallbladder: Secondary | ICD-10-CM

## 2018-11-21 NOTE — Telephone Encounter (Signed)
I have placed referral to general surgery [facility]).  Thank you.  Dr. Maylon Peppers

## 2018-11-21 NOTE — Progress Notes (Unsigned)
neral

## 2018-12-03 ENCOUNTER — Ambulatory Visit: Payer: Self-pay | Admitting: General Surgery

## 2018-12-03 DIAGNOSIS — K8020 Calculus of gallbladder without cholecystitis without obstruction: Secondary | ICD-10-CM | POA: Diagnosis not present

## 2018-12-03 NOTE — H&P (View-Only) (Signed)
History of Present Illness Jennifer Ok MD; 12/03/2018 3:36 PM) The patient is a 48 year old female who presents for evaluation of gall stones. Referred by: Dr. Maylon Peppers Chief Complaint: Abdominal pain  Patient is a 48 year old female who comes in secondary to epigastric abdominal pain. Patient states that pain is fairly constant consistent. She denies any change with any particular foods. She denies any radiation of the pain. She states that the pain as pressure-like. Patient does state that she has some nausea., and denies any diarrhea Patient recently underwent ultrasound which revealed gallstones are no signs of cholecystitis. Patient did have LFTs were within normal limits. I did review both ultrasound as well as laboratory studies personally.  Patient denies any previous abdominal surgery.    Past Surgical History Jennifer Mcknight, [place]; 12/03/2018 3:09 PM) Breast Reconstruction  Bilateral. Cesarean Section - 1   Diagnostic Studies History Jennifer Mcknight, CMA; 12/03/2018 3:09 PM) Colonoscopy  never Mammogram  1-3 years ago Pap Smear  1-5 years ago  Allergies Jennifer Mcknight, CMA; 12/03/2018 3:10 PM) No Known Drug Allergies  [12/03/2018]: No Known Allergies  [12/03/2018]: Allergies Reconciled   Medication History Jennifer Mcknight, CMA; 12/03/2018 3:10 PM) Viibryd (40MG Tablet, Oral) Active. Fetzima (80MG Capsule ER 24HR, Oral) Active. Medications Reconciled  Pregnancy / Birth History Jennifer Mcknight, CMA; 12/03/2018 3:09 PM) Age at menarche  62 years. Gravida  2 Maternal age  78-20 Para  2 Regular periods   Other Problems Jennifer Mcknight, CMA; 12/03/2018 3:09 PM) Depression     Review of Systems Jennifer Ok MD; 12/03/2018 3:34 PM) General Present- Weight Gain. Not Present- Appetite Loss, Chills, Fatigue, Fever, Night Sweats and Weight Loss. Skin Not Present- Change in Wart/Mole, Dryness, Hives, Jaundice, New Lesions, Non-Healing Wounds, Rash  and Ulcer. HEENT Present- Wears glasses/contact lenses. Not Present- Earache, Hearing Loss, Hoarseness, Nose Bleed, Oral Ulcers, Ringing in the Ears, Seasonal Allergies, Sinus Pain, Sore Throat, Visual Disturbances and Yellow Eyes. Respiratory Present- Snoring. Not Present- Bloody sputum, Chronic Cough, Difficulty Breathing and Wheezing. Breast Not Present- Breast Mass, Breast Pain, Nipple Discharge and Skin Changes. Cardiovascular Not Present- Chest Pain, Difficulty Breathing Lying Down, Leg Cramps, Palpitations, Rapid Heart Rate, Shortness of Breath and Swelling of Extremities. Gastrointestinal Present- Constipation. Not Present- Abdominal Pain, Bloating, Bloody Stool, Change in Bowel Habits, Chronic diarrhea, Difficulty Swallowing, Excessive gas, Gets full quickly at meals, Hemorrhoids, Indigestion, Nausea, Rectal Pain and Vomiting. Female Genitourinary Present- Urgency. Not Present- Frequency, Nocturia, Painful Urination and Pelvic Pain. Musculoskeletal Present- Joint Stiffness. Not Present- Back Pain, Joint Pain, Muscle Pain, Muscle Weakness and Swelling of Extremities. Neurological Not Present- Decreased Memory, Fainting, Headaches, Numbness, Seizures, Tingling, Tremor, Trouble walking and Weakness. Psychiatric Present- Depression. Not Present- Anxiety, Bipolar, Change in Sleep Pattern, Fearful and Frequent crying. Endocrine Present- Excessive Hunger, Heat Intolerance and Hot flashes. Not Present- Cold Intolerance, Hair Changes and New Diabetes. Hematology Not Present- Blood Thinners, Easy Bruising, Excessive bleeding, Gland problems, HIV and Persistent Infections. All other systems negative  Vitals (Sabrina Canty CMA; 12/03/2018 3:11 PM) 12/03/2018 3:10 PM Weight: 192.8 lb Height: 67in Body Surface Area: 1.99 m Body Mass Index: 30.2 kg/m  Temp.: 97.31F (Temporal)  Pulse: 105 (Regular)  BP: 112/64(Sitting, Left Arm, Standard)       Physical Exam Jennifer Ok MD;  12/03/2018 3:36 PM) The physical exam findings are as follows: Note: Constitutional: No acute distress, conversant, appears stated age  Eyes: Anicteric sclerae, moist conjunctiva, no lid lag  Neck: No thyromegaly, trachea midline, no cervical lymphadenopathy  Lungs: Clear to auscultation biilaterally, normal respiratory effot  Cardiovascular: regular rate & rhythm, no murmurs, no peripheal edema, pedal pulses 2+  GI: Soft, no masses or hepatosplenomegaly, tender to palpation to right upper quadrant  MSK: Normal gait, no clubbing cyanosis, edema  Skin: No rashes, palpation reveals normal skin turgor  Psychiatric: Appropriate judgment and insight, oriented to person, place, and time    Assessment & Plan Jennifer Ok MD; 12/03/2018 3:37 PM)  SYMPTOMATIC CHOLELITHIASIS (K80.20) Impression: 48 year old female symptomatic cholelithiasis, chronic cholecystitis 1. We will proceed to the operating room for a laparoscopic cholecystectomy  2. Risks and benefits were discussed with the patient to generally include, but not limited to: infection, bleeding, possible need for post op ERCP, damage to the bile ducts, bile leak, and possible need for further surgery. Alternatives were offered and described. All questions were answered and the patient voiced understanding of the procedure and wishes to proceed at this point with a laparoscopic cholecystectomy

## 2018-12-03 NOTE — H&P (Signed)
History of Present Illness Ralene Ok MD; 12/03/2018 3:36 PM) The patient is a 47 year old female who presents for evaluation of gall stones. Referred by: Dr. Maylon Peppers Chief Complaint: Abdominal pain  Patient is a 48 year old female who comes in secondary to epigastric abdominal pain. Patient states that pain is fairly constant consistent. She denies any change with any particular foods. She denies any radiation of the pain. She states that the pain as pressure-like. Patient does state that she has some nausea., and denies any diarrhea Patient recently underwent ultrasound which revealed gallstones are no signs of cholecystitis. Patient did have LFTs were within normal limits. I did review both ultrasound as well as laboratory studies personally.  Patient denies any previous abdominal surgery.    Past Surgical History [name], [place]; 12/03/2018 3:09 PM) Breast Reconstruction  Bilateral. Cesarean Section - 1   Diagnostic Studies History [name], CMA; 12/03/2018 3:09 PM) Colonoscopy  never Mammogram  1-3 years ago Pap Smear  1-5 years ago  Allergies [name], CMA; 12/03/2018 3:10 PM) No Known Drug Allergies  [12/03/2018]: No Known Allergies  [12/03/2018]: Allergies Reconciled   Medication History [name], CMA; 12/03/2018 3:10 PM) Viibryd (40MG Tablet, Oral) Active. Fetzima (80MG Capsule ER 24HR, Oral) Active. Medications Reconciled  Pregnancy / Birth History [name], CMA; 12/03/2018 3:09 PM) Age at menarche  10 years. Gravida  2 Maternal age  69-20 Para  2 Regular periods   Other Problems [name], CMA; 12/03/2018 3:09 PM) Depression     Review of Systems Ralene Ok MD; 12/03/2018 3:34 PM) General Present- Weight Gain. Not Present- Appetite Loss, Chills, Fatigue, Fever, Night Sweats and Weight Loss. Skin Not Present- Change in Wart/Mole, Dryness, Hives, Jaundice, New Lesions, Non-Healing Wounds, Rash  and Ulcer. HEENT Present- Wears glasses/contact lenses. Not Present- Earache, Hearing Loss, Hoarseness, Nose Bleed, Oral Ulcers, Ringing in the Ears, Seasonal Allergies, Sinus Pain, Sore Throat, Visual Disturbances and Yellow Eyes. Respiratory Present- Snoring. Not Present- Bloody sputum, Chronic Cough, Difficulty Breathing and Wheezing. Breast Not Present- Breast Mass, Breast Pain, Nipple Discharge and Skin Changes. Cardiovascular Not Present- Chest Pain, Difficulty Breathing Lying Down, Leg Cramps, Palpitations, Rapid Heart Rate, Shortness of Breath and Swelling of Extremities. Gastrointestinal Present- Constipation. Not Present- Abdominal Pain, Bloating, Bloody Stool, Change in Bowel Habits, Chronic diarrhea, Difficulty Swallowing, Excessive gas, Gets full quickly at meals, Hemorrhoids, Indigestion, Nausea, Rectal Pain and Vomiting. Female Genitourinary Present- Urgency. Not Present- Frequency, Nocturia, Painful Urination and Pelvic Pain. Musculoskeletal Present- Joint Stiffness. Not Present- Back Pain, Joint Pain, Muscle Pain, Muscle Weakness and Swelling of Extremities. Neurological Not Present- Decreased Memory, Fainting, Headaches, Numbness, Seizures, Tingling, Tremor, Trouble walking and Weakness. Psychiatric Present- Depression. Not Present- Anxiety, Bipolar, Change in Sleep Pattern, Fearful and Frequent crying. Endocrine Present- Excessive Hunger, Heat Intolerance and Hot flashes. Not Present- Cold Intolerance, Hair Changes and New Diabetes. Hematology Not Present- Blood Thinners, Easy Bruising, Excessive bleeding, Gland problems, HIV and Persistent Infections. All other systems negative  Vitals (Sabrina Canty CMA; 12/03/2018 3:11 PM) 12/03/2018 3:10 PM Weight: 192.8 lb Height: 67in Body Surface Area: 1.99 m Body Mass Index: 30.2 kg/m  Temp.: 97.32F (Temporal)  Pulse: 105 (Regular)  BP: 112/64(Sitting, Left Arm, Standard)       Physical Exam Ralene Ok MD;  12/03/2018 3:36 PM) The physical exam findings are as follows: Note: Constitutional: No acute distress, conversant, appears stated age  Eyes: Anicteric sclerae, moist conjunctiva, no lid lag  Neck: No thyromegaly, trachea midline, no cervical lymphadenopathy  Lungs: Clear to auscultation biilaterally, normal respiratory effot  Cardiovascular: regular rate & rhythm, no murmurs, no peripheal edema, pedal pulses 2+  GI: Soft, no masses or hepatosplenomegaly, tender to palpation to right upper quadrant  MSK: Normal gait, no clubbing cyanosis, edema  Skin: No rashes, palpation reveals normal skin turgor  Psychiatric: Appropriate judgment and insight, oriented to person, place, and time    Assessment & Plan Ralene Ok MD; 12/03/2018 3:37 PM)  SYMPTOMATIC CHOLELITHIASIS (K80.20) Impression: 48 year old female symptomatic cholelithiasis, chronic cholecystitis 1. We will proceed to the operating room for a laparoscopic cholecystectomy  2. Risks and benefits were discussed with the patient to generally include, but not limited to: infection, bleeding, possible need for post op ERCP, damage to the bile ducts, bile leak, and possible need for further surgery. Alternatives were offered and described. All questions were answered and the patient voiced understanding of the procedure and wishes to proceed at this point with a laparoscopic cholecystectomy

## 2018-12-26 NOTE — Progress Notes (Signed)
[facility] [place], [place] AT [place] [address] [place] [address] Phone: [phone] Fax: [phone]    Your procedure is scheduled on Monday, November 9th.  Report to [facility] Main Entrance "A" at 8:55 A.M., and check in at the Admitting office.  Call this number if you have problems the morning of surgery:  [phone]  Call [phone] if you have any questions prior to your surgery date Monday-Friday 8am-4pm   Remember:  Do not eat or drink after midnight the night before your surgery   Take these medicines the morning of surgery with A SIP OF WATER  Levomilnacipran HCl ER (FETZIMA)  Vilazodone HCl (VIIBRYD)  If needed - fluticasone (FLONASE)/nasal spray,    As of today, STOP taking any Aspirin (unless otherwise instructed by your surgeon), Aleve, Naproxen, Ibuprofen, Motrin, Advil, Goody's, BC's, all herbal medications, fish oil, and all vitamins.   The Morning of Surgery  Do not wear jewelry, make-up or nail polish.  Do not wear lotions, powders, perfumes, or deodorant  Do not shave 48 hours prior to surgery.    Do not bring valuables to the hospital.  [facility] is not responsible for any belongings or valuables.  If you are a smoker, DO NOT Smoke 24 hours prior to surgery IF you wear a CPAP at night please bring your mask, tubing, and machine the morning of surgery   Remember that you must have someone to transport you home after your surgery, and remain with you for 24 hours if you are discharged the same day.  Contacts, glasses, hearing aids, dentures or bridgework may not be worn into surgery.   Leave your suitcase in the car.  After surgery it may be brought to your room.  For patients admitted to the hospital, discharge time will be determined by your treatment team.  Patients discharged the day of surgery will not be allowed to drive home.   Special instructions:   [place]-  Preparing For Surgery  Before surgery, you can play an important role. Because skin is not sterile, your skin needs to be as free of germs as possible. You can reduce the number of germs on your skin by washing with CHG (chlorahexidine gluconate) Soap before surgery.  CHG is an antiseptic cleaner which kills germs and bonds with the skin to continue killing germs even after washing.    Oral Hygiene is also important to reduce your risk of infection.  Remember - BRUSH YOUR TEETH THE MORNING OF SURGERY WITH YOUR REGULAR TOOTHPASTE  Please do not use if you have an allergy to CHG or antibacterial soaps. If your skin becomes reddened/irritated stop using the CHG.  Do not shave (including legs and underarms) for at least 48 hours prior to first CHG shower. It is OK to shave your face.  Please follow these instructions carefully.   1. Shower the NIGHT BEFORE SURGERY and the MORNING OF SURGERY with CHG Soap.   2. If you chose to wash your hair, wash your hair first as usual with your normal shampoo.  3. After you shampoo, rinse your hair and body thoroughly to remove the shampoo.  4. Use CHG as you would any other liquid soap. You can apply CHG directly to the skin and wash gently with a scrungie or a clean washcloth.   5. Apply the CHG Soap to your body ONLY FROM THE NECK DOWN.  Do not use on  open wounds or open sores. Avoid contact with your eyes, ears, mouth and genitals (private parts). Wash Face and genitals (private parts)  with your normal soap.   6. Wash thoroughly, paying special attention to the area where your surgery will be performed.  7. Thoroughly rinse your body with warm water from the neck down.  8. DO NOT shower/wash with your normal soap after using and rinsing off the CHG Soap.  9. Pat yourself dry with a CLEAN TOWEL.  10. Wear CLEAN PAJAMAS to bed the night before surgery, wear comfortable clothes the morning of surgery  11. Place CLEAN SHEETS on your bed the night of  your first shower and DO NOT SLEEP WITH PETS.  Day of Surgery: Do not apply any deodorants/lotions. Please shower the morning of surgery with the CHG soap  Please wear clean clothes to the hospital/surgery center.   Remember to brush your teeth WITH YOUR REGULAR TOOTHPASTE.  Please read over the following fact sheets that you were given.

## 2018-12-27 ENCOUNTER — Encounter (HOSPITAL_COMMUNITY)
Admission: RE | Admit: 2018-12-27 | Discharge: 2018-12-27 | Disposition: A | Discharge status: Home / Self Care | Payer: BLUE CROSS/BLUE SHIELD | Source: Ambulatory Visit | Attending: General Surgery | Admitting: General Surgery

## 2018-12-27 ENCOUNTER — Other Ambulatory Visit: Payer: Self-pay

## 2018-12-27 ENCOUNTER — Other Ambulatory Visit (HOSPITAL_COMMUNITY)
Admission: RE | Admit: 2018-12-27 | Discharge: 2018-12-27 | Disposition: A | Discharge status: Home / Self Care | Payer: BLUE CROSS/BLUE SHIELD | Source: Ambulatory Visit | Attending: General Surgery | Admitting: General Surgery

## 2018-12-27 ENCOUNTER — Encounter (HOSPITAL_COMMUNITY): Payer: Self-pay

## 2018-12-27 DIAGNOSIS — Z01812 Encounter for preprocedural laboratory examination: Secondary | ICD-10-CM | POA: Insufficient documentation

## 2018-12-27 DIAGNOSIS — Z20828 Contact with and (suspected) exposure to other viral communicable diseases: Secondary | ICD-10-CM | POA: Insufficient documentation

## 2018-12-27 HISTORY — DX: Other specified postprocedural states: Z98.890

## 2018-12-27 HISTORY — DX: Nausea with vomiting, unspecified: R11.2

## 2018-12-27 HISTORY — DX: Sleep apnea, unspecified: G47.30

## 2018-12-27 HISTORY — DX: Headache, unspecified: R51.9

## 2018-12-27 HISTORY — DX: Abnormal levels of other serum enzymes: R74.8

## 2018-12-27 HISTORY — DX: Family history of other specified conditions: Z84.89

## 2018-12-27 LAB — CBC
HCT: 44.2 % (ref 36.0–46.0)
Hemoglobin: 14.4 g/dL (ref 12.0–15.0)
MCH: 29.2 pg (ref 26.0–34.0)
MCHC: 32.6 g/dL (ref 30.0–36.0)
MCV: 89.7 fL (ref 80.0–100.0)
Platelets: 455 10*3/uL — ABNORMAL HIGH (ref 150–400)
RBC: 4.93 MIL/uL (ref 3.87–5.11)
RDW: 12.5 % (ref 11.5–15.5)
WBC: 11.2 10*3/uL — ABNORMAL HIGH (ref 4.0–10.5)
nRBC: 0 % (ref 0.0–0.2)

## 2018-12-27 NOTE — Progress Notes (Addendum)
PCP - Dr. Scarlette Calico Cardiologist - denies  PPM/ICD - denies Device Orders - N/A Rep Notified - N/A  Chest x-ray - N/A EKG - needed DOS  Stress Test - denies  ECHO - denies Cardiac Cath - denies  Sleep Study - Yes, per patinet about  5 years ago CPAP - No, per patient unable to tolerate  Blood Thinner Instructions: N/A Aspirin Instructions: N/A  ERAS Protcol - No PRE-SURGERY Ensure or G2- N/A  COVID TEST- Scheduled for today after PAT appointment. Patient verbalized understanding of self-quarantine, appointment time, and place.  Anesthesia review: No  Patient denies shortness of breath, fever, cough and chest pain at PAT appointment  All instructions explained to the patient, with a verbal understanding of the material. Patient agrees to go over the instructions while at home for a better understanding. Patient also instructed to self quarantine after being tested for COVID-19. The opportunity to ask questions was provided.

## 2018-12-28 LAB — NOVEL CORONAVIRUS, NAA (HOSP ORDER, SEND-OUT TO REF LAB; TAT 18-24 HRS): SARS-CoV-2, NAA: NOT DETECTED

## 2018-12-31 ENCOUNTER — Encounter (HOSPITAL_COMMUNITY): Payer: Self-pay | Admitting: *Deleted

## 2018-12-31 ENCOUNTER — Encounter (HOSPITAL_COMMUNITY)
Admission: RE | Disposition: A | Discharge status: Home / Self Care | Payer: Self-pay | Source: Home / Self Care | Attending: General Surgery

## 2018-12-31 ENCOUNTER — Ambulatory Visit (HOSPITAL_COMMUNITY): Payer: BLUE CROSS/BLUE SHIELD | Admitting: Physician Assistant

## 2018-12-31 ENCOUNTER — Ambulatory Visit (HOSPITAL_COMMUNITY): Payer: BLUE CROSS/BLUE SHIELD | Admitting: Anesthesiology

## 2018-12-31 ENCOUNTER — Other Ambulatory Visit: Payer: Self-pay

## 2018-12-31 ENCOUNTER — Ambulatory Visit (HOSPITAL_COMMUNITY)
Admission: RE | Admit: 2018-12-31 | Discharge: 2018-12-31 | Disposition: A | Discharge status: Home / Self Care | Payer: BLUE CROSS/BLUE SHIELD | Attending: General Surgery | Admitting: General Surgery

## 2018-12-31 DIAGNOSIS — K5900 Constipation, unspecified: Secondary | ICD-10-CM | POA: Insufficient documentation

## 2018-12-31 DIAGNOSIS — G4730 Sleep apnea, unspecified: Secondary | ICD-10-CM | POA: Insufficient documentation

## 2018-12-31 DIAGNOSIS — R519 Headache, unspecified: Secondary | ICD-10-CM | POA: Insufficient documentation

## 2018-12-31 DIAGNOSIS — E669 Obesity, unspecified: Secondary | ICD-10-CM | POA: Diagnosis not present

## 2018-12-31 DIAGNOSIS — D72829 Elevated white blood cell count, unspecified: Secondary | ICD-10-CM | POA: Diagnosis not present

## 2018-12-31 DIAGNOSIS — F329 Major depressive disorder, single episode, unspecified: Secondary | ICD-10-CM | POA: Diagnosis not present

## 2018-12-31 DIAGNOSIS — Z6830 Body mass index (BMI) 30.0-30.9, adult: Secondary | ICD-10-CM | POA: Insufficient documentation

## 2018-12-31 DIAGNOSIS — K8010 Calculus of gallbladder with chronic cholecystitis without obstruction: Secondary | ICD-10-CM | POA: Insufficient documentation

## 2018-12-31 DIAGNOSIS — G4733 Obstructive sleep apnea (adult) (pediatric): Secondary | ICD-10-CM | POA: Diagnosis not present

## 2018-12-31 DIAGNOSIS — I10 Essential (primary) hypertension: Secondary | ICD-10-CM | POA: Insufficient documentation

## 2018-12-31 DIAGNOSIS — Z87891 Personal history of nicotine dependence: Secondary | ICD-10-CM | POA: Diagnosis not present

## 2018-12-31 HISTORY — PX: CHOLECYSTECTOMY: SHX55

## 2018-12-31 LAB — COMPREHENSIVE METABOLIC PANEL
ALT: 67 U/L — ABNORMAL HIGH (ref 0–44)
AST: 42 U/L — ABNORMAL HIGH (ref 15–41)
Albumin: 4 g/dL (ref 3.5–5.0)
Alkaline Phosphatase: 61 U/L (ref 38–126)
Anion gap: 11 (ref 5–15)
BUN: 15 mg/dL (ref 6–20)
CO2: 23 mmol/L (ref 22–32)
Calcium: 9.3 mg/dL (ref 8.9–10.3)
Chloride: 105 mmol/L (ref 98–111)
Creatinine, Ser: 0.67 mg/dL (ref 0.44–1.00)
GFR calc Af Amer: >60 mL/min (ref >60)
GFR calc non Af Amer: >60 mL/min (ref >60)
Glucose, Bld: 90 mg/dL (ref 70–99)
Potassium: 3.9 mmol/L (ref 3.5–5.1)
Sodium: 139 mmol/L (ref 135–145)
Total Bilirubin: 0.2 mg/dL — ABNORMAL LOW (ref 0.3–1.2)
Total Protein: 7.4 g/dL (ref 6.5–8.1)

## 2018-12-31 LAB — POCT PREGNANCY, URINE: Preg Test, Ur: NEGATIVE

## 2018-12-31 SURGERY — LAPAROSCOPIC CHOLECYSTECTOMY
Anesthesia: General | Site: Abdomen

## 2018-12-31 MED ORDER — LACTATED RINGERS IV SOLN
INTRAVENOUS | Status: DC
  Administered 2018-12-31: 10:00:00 via INTRAVENOUS

## 2018-12-31 MED ORDER — TRAMADOL HCL 50 MG PO TABS: 50.0000 mg | ORAL_TABLET | Freq: Four times a day (QID) | ORAL | 0 refills | Status: DC | PRN

## 2018-12-31 MED ORDER — HYDROMORPHONE HCL 1 MG/ML IJ SOLN: 0.2500 mg | INTRAMUSCULAR | Status: DC | PRN

## 2018-12-31 MED ORDER — DEXAMETHASONE SODIUM PHOSPHATE 10 MG/ML IJ SOLN
INTRAMUSCULAR | Status: AC
  Filled 2018-12-31: qty 2

## 2018-12-31 MED ORDER — ONDANSETRON HCL 4 MG/2ML IJ SOLN
INTRAMUSCULAR | Status: DC | PRN
  Administered 2018-12-31: 4 mg via INTRAVENOUS

## 2018-12-31 MED ORDER — PROMETHAZINE HCL 25 MG/ML IJ SOLN
INTRAMUSCULAR | Status: AC
  Filled 2018-12-31: qty 1

## 2018-12-31 MED ORDER — CELECOXIB 200 MG PO CAPS
200.0000 mg | ORAL_CAPSULE | ORAL | Status: AC
  Administered 2018-12-31: 10:00:00 200 mg via ORAL

## 2018-12-31 MED ORDER — 0.9 % SODIUM CHLORIDE (POUR BTL) OPTIME
TOPICAL | Status: DC | PRN
  Administered 2018-12-31: 1000 mL

## 2018-12-31 MED ORDER — CEFAZOLIN SODIUM-DEXTROSE 2-4 GM/100ML-% IV SOLN
INTRAVENOUS | Status: AC
  Filled 2018-12-31: qty 100

## 2018-12-31 MED ORDER — MIDAZOLAM HCL 2 MG/2ML IJ SOLN
INTRAMUSCULAR | Status: AC
  Filled 2018-12-31: qty 2

## 2018-12-31 MED ORDER — DIPHENHYDRAMINE HCL 50 MG/ML IJ SOLN
INTRAMUSCULAR | Status: AC
  Filled 2018-12-31: qty 2

## 2018-12-31 MED ORDER — FENTANYL CITRATE (PF) 100 MCG/2ML IJ SOLN
INTRAMUSCULAR | Status: DC | PRN
  Administered 2018-12-31 (×3): 50 ug via INTRAVENOUS

## 2018-12-31 MED ORDER — CEFAZOLIN SODIUM-DEXTROSE 2-4 GM/100ML-% IV SOLN
2.0000 g | INTRAVENOUS | Status: AC
  Administered 2018-12-31: 10:00:00 2 g via INTRAVENOUS

## 2018-12-31 MED ORDER — MEPERIDINE HCL 25 MG/ML IJ SOLN: 6.2500 mg | INTRAMUSCULAR | Status: DC | PRN

## 2018-12-31 MED ORDER — SUGAMMADEX SODIUM 200 MG/2ML IV SOLN
INTRAVENOUS | Status: DC | PRN
  Administered 2018-12-31: 200 mg via INTRAVENOUS

## 2018-12-31 MED ORDER — ONDANSETRON HCL 4 MG/2ML IJ SOLN
INTRAMUSCULAR | Status: AC
  Filled 2018-12-31: qty 4

## 2018-12-31 MED ORDER — BUPIVACAINE HCL 0.25 % IJ SOLN
INTRAMUSCULAR | Status: DC | PRN
  Administered 2018-12-31: 5 mL

## 2018-12-31 MED ORDER — PROMETHAZINE HCL 25 MG/ML IJ SOLN
6.2500 mg | INTRAMUSCULAR | Status: AC | PRN
  Administered 2018-12-31 (×2): 6.25 mg via INTRAVENOUS

## 2018-12-31 MED ORDER — PROPOFOL 10 MG/ML IV BOLUS
INTRAVENOUS | Status: DC | PRN
  Administered 2018-12-31: 200 mg via INTRAVENOUS

## 2018-12-31 MED ORDER — FENTANYL CITRATE (PF) 250 MCG/5ML IJ SOLN
INTRAMUSCULAR | Status: AC
  Filled 2018-12-31: qty 5

## 2018-12-31 MED ORDER — LIDOCAINE 2% (20 MG/ML) 5 ML SYRINGE
INTRAMUSCULAR | Status: DC | PRN
  Administered 2018-12-31: 100 mg via INTRAVENOUS

## 2018-12-31 MED ORDER — BUPIVACAINE HCL (PF) 0.25 % IJ SOLN
INTRAMUSCULAR | Status: AC
  Filled 2018-12-31: qty 20

## 2018-12-31 MED ORDER — PROPOFOL 1000 MG/100ML IV EMUL
INTRAVENOUS | Status: AC
  Filled 2018-12-31: qty 100

## 2018-12-31 MED ORDER — ROCURONIUM BROMIDE 10 MG/ML (PF) SYRINGE
PREFILLED_SYRINGE | INTRAVENOUS | Status: DC | PRN
  Administered 2018-12-31: 50 mg via INTRAVENOUS

## 2018-12-31 MED ORDER — DEXAMETHASONE SODIUM PHOSPHATE 10 MG/ML IJ SOLN
INTRAMUSCULAR | Status: DC | PRN
  Administered 2018-12-31: 10 mg via INTRAVENOUS

## 2018-12-31 MED ORDER — CHLORHEXIDINE GLUCONATE CLOTH 2 % EX PADS: 6.0000 | MEDICATED_PAD | Freq: Once | CUTANEOUS | Status: DC

## 2018-12-31 MED ORDER — ACETAMINOPHEN 500 MG PO TABS
ORAL_TABLET | ORAL | Status: AC
  Administered 2018-12-31: 1000 mg via ORAL
  Filled 2018-12-31: qty 2

## 2018-12-31 MED ORDER — PROPOFOL 500 MG/50ML IV EMUL
INTRAVENOUS | Status: DC | PRN
  Administered 2018-12-31: 25 ug/kg/min via INTRAVENOUS

## 2018-12-31 MED ORDER — ROCURONIUM BROMIDE 10 MG/ML (PF) SYRINGE
PREFILLED_SYRINGE | INTRAVENOUS | Status: AC
  Filled 2018-12-31: qty 20

## 2018-12-31 MED ORDER — LIDOCAINE 2% (20 MG/ML) 5 ML SYRINGE
INTRAMUSCULAR | Status: AC
  Filled 2018-12-31: qty 10

## 2018-12-31 MED ORDER — DIPHENHYDRAMINE HCL 50 MG/ML IJ SOLN
INTRAMUSCULAR | Status: DC | PRN
  Administered 2018-12-31: 12.5 mg via INTRAVENOUS

## 2018-12-31 MED ORDER — CELECOXIB 200 MG PO CAPS
ORAL_CAPSULE | ORAL | Status: AC
  Administered 2018-12-31: 10:00:00 200 mg via ORAL
  Filled 2018-12-31: qty 1

## 2018-12-31 MED ORDER — SODIUM CHLORIDE 0.9 % IR SOLN
Status: DC | PRN
  Administered 2018-12-31: 1

## 2018-12-31 MED ORDER — ACETAMINOPHEN 500 MG PO TABS
1000.0000 mg | ORAL_TABLET | ORAL | Status: AC
  Administered 2018-12-31: 10:00:00 1000 mg via ORAL

## 2018-12-31 MED ORDER — MIDAZOLAM HCL 5 MG/5ML IJ SOLN
INTRAMUSCULAR | Status: DC | PRN
  Administered 2018-12-31: 2 mg via INTRAVENOUS

## 2018-12-31 SURGICAL SUPPLY — 37 items
CANISTER SUCT 3000ML PPV (MISCELLANEOUS) ×2 IMPLANT
CHLORAPREP W/TINT 26 (MISCELLANEOUS) ×2 IMPLANT
CLIP VESOLOCK MED LG 6/CT (CLIP) ×2 IMPLANT
COVER SURGICAL LIGHT HANDLE (MISCELLANEOUS) ×2 IMPLANT
COVER TRANSDUCER ULTRASND (DRAPES) ×2 IMPLANT
COVER WAND RF STERILE (DRAPES) ×2 IMPLANT
DEFOGGER SCOPE WARMER CLEARIFY (MISCELLANEOUS) IMPLANT
DERMABOND ADVANCED (GAUZE/BANDAGES/DRESSINGS) ×1
DERMABOND ADVANCED .7 DNX12 (GAUZE/BANDAGES/DRESSINGS) ×1 IMPLANT
ELECT REM PT RETURN 9FT ADLT (ELECTROSURGICAL) ×2
ELECTRODE REM PT RTRN 9FT ADLT (ELECTROSURGICAL) ×1 IMPLANT
GLOVE BIO SURGEON STRL SZ7.5 (GLOVE) ×2 IMPLANT
GOWN STRL REUS W/ TWL LRG LVL3 (GOWN DISPOSABLE) ×2 IMPLANT
GOWN STRL REUS W/ TWL XL LVL3 (GOWN DISPOSABLE) ×1 IMPLANT
GOWN STRL REUS W/TWL LRG LVL3 (GOWN DISPOSABLE) ×2
GOWN STRL REUS W/TWL XL LVL3 (GOWN DISPOSABLE) ×1
GRASPER SUT TROCAR 14GX15 (MISCELLANEOUS) ×2 IMPLANT
KIT BASIN OR (CUSTOM PROCEDURE TRAY) ×2 IMPLANT
KIT TURNOVER KIT B (KITS) ×2 IMPLANT
NEEDLE INSUFFLATION 14GA 120MM (NEEDLE) ×2 IMPLANT
NS IRRIG 1000ML POUR BTL (IV SOLUTION) ×2 IMPLANT
PAD ARMBOARD 7.5X6 YLW CONV (MISCELLANEOUS) ×2 IMPLANT
POUCH LAPAROSCOPIC INSTRUMENT (MISCELLANEOUS) ×2 IMPLANT
POUCH RETRIEVAL ECOSAC 10 (ENDOMECHANICALS) IMPLANT
POUCH RETRIEVAL ECOSAC 10MM (ENDOMECHANICALS)
SCISSORS LAP 5X35 DISP (ENDOMECHANICALS) ×2 IMPLANT
SET IRRIG TUBING LAPAROSCOPIC (IRRIGATION / IRRIGATOR) ×2 IMPLANT
SET TUBE SMOKE EVAC HIGH FLOW (TUBING) ×2 IMPLANT
SLEEVE ENDOPATH XCEL 5M (ENDOMECHANICALS) ×2 IMPLANT
SPECIMEN JAR SMALL (MISCELLANEOUS) ×2 IMPLANT
SUT MNCRL AB 4-0 PS2 18 (SUTURE) ×2 IMPLANT
TOWEL GREEN STERILE (TOWEL DISPOSABLE) ×2 IMPLANT
TOWEL GREEN STERILE FF (TOWEL DISPOSABLE) ×2 IMPLANT
TRAY LAPAROSCOPIC MC (CUSTOM PROCEDURE TRAY) ×2 IMPLANT
TROCAR XCEL NON-BLD 11X100MML (ENDOMECHANICALS) ×2 IMPLANT
TROCAR XCEL NON-BLD 5MMX100MML (ENDOMECHANICALS) ×2 IMPLANT
WATER STERILE IRR 1000ML POUR (IV SOLUTION) ×2 IMPLANT

## 2018-12-31 NOTE — Anesthesia Preprocedure Evaluation (Signed)
Anesthesia Evaluation  Patient identified by MRN, date of birth, ID band Patient awake    Reviewed: Allergy & Precautions, NPO status , Patient's Chart, lab work & pertinent test results  History of Anesthesia Complications (+) PONV, Family history of anesthesia reaction and history of anesthetic complications  Airway Mallampati: II  TM Distance: >3 FB Neck ROM: Full    Dental  (+) Dental Advisory Given   Pulmonary sleep apnea , former smoker,    Pulmonary exam normal breath sounds clear to auscultation       Cardiovascular hypertension, Normal cardiovascular exam Rhythm:Regular Rate:Normal     Neuro/Psych  Headaches, negative psych ROS   GI/Hepatic negative GI ROS, Neg liver ROS,   Endo/Other  negative endocrine ROS  Renal/GU negative Renal ROS     Musculoskeletal negative musculoskeletal ROS (+)   Abdominal (+) + obese,   Peds  Hematology negative hematology ROS (+)   Anesthesia Other Findings   Reproductive/Obstetrics negative OB ROS                             Anesthesia Physical Anesthesia Plan  ASA: II  Anesthesia Plan: General   Post-op Pain Management:    Induction: Intravenous  PONV Risk Score and Plan: 4 or greater and Ondansetron, Dexamethasone, Midazolam and Treatment may vary due to age or medical condition  Airway Management Planned: Oral ETT  Additional Equipment: None  Intra-op Plan:   Post-operative Plan: Extubation in OR  Informed Consent: I have reviewed the patients History and Physical, chart, labs and discussed the procedure including the risks, benefits and alternatives for the proposed anesthesia with the patient or authorized representative who has indicated his/her understanding and acceptance.     Dental advisory given  Plan Discussed with: CRNA  Anesthesia Plan Comments:         Anesthesia Quick Evaluation

## 2018-12-31 NOTE — Anesthesia Postprocedure Evaluation (Signed)
Anesthesia Post Note  Patient: Jennifer Mcknight  Procedure(s) Performed: LAPAROSCOPIC CHOLECYSTECTOMY (N/A Abdomen)     Patient location during evaluation: PACU Anesthesia Type: General Level of consciousness: sedated and patient cooperative Pain management: pain level controlled Vital Signs Assessment: post-procedure vital signs reviewed and stable Respiratory status: spontaneous breathing Cardiovascular status: stable Anesthetic complications: no    Last Vitals:  Vitals:   12/31/18 1148 12/31/18 1203  BP: 123/77 124/80  Pulse: 77 76  Resp: 14 13  Temp:    SpO2: 92% 92%    Last Pain:  Vitals:   12/31/18 1132  PainSc: 0-No pain                 Nolon Nations

## 2018-12-31 NOTE — Anesthesia Procedure Notes (Signed)
Procedure Name: Intubation Date/Time: 12/31/2018 10:23 AM Performed by: Imagene Riches, CRNA Pre-anesthesia Checklist: Patient identified, Emergency Drugs available, Suction available and Patient being monitored Patient Re-evaluated:Patient Re-evaluated prior to induction Oxygen Delivery Method: Circle System Utilized Preoxygenation: Pre-oxygenation with 100% oxygen Induction Type: IV induction Ventilation: Mask ventilation without difficulty Laryngoscope Size: Miller and 2 Grade View: Grade I Tube type: Oral Tube size: 7.0 mm Number of attempts: 2 Airway Equipment and Method: Stylet and Oral airway Placement Confirmation: ETT inserted through vocal cords under direct vision,  positive ETCO2 and breath sounds checked- equal and bilateral Secured at: 22 cm Tube secured with: Tape Dental Injury: Teeth and Oropharynx as per pre-operative assessment

## 2018-12-31 NOTE — Interval H&P Note (Signed)
History and Physical Interval Note:  12/31/2018 9:48 AM  Jennifer Mcknight  has presented today for surgery, with the diagnosis of GALLSTONES.  The various methods of treatment have been discussed with the patient and family. After consideration of risks, benefits and other options for treatment, the patient has consented to  Procedure(s): LAPAROSCOPIC CHOLECYSTECTOMY (N/A) as a surgical intervention.  The patient's history has been reviewed, patient examined, no change in status, stable for surgery.  I have reviewed the patient's chart and labs.  Questions were answered to the patient's satisfaction.     Ralene Ok

## 2018-12-31 NOTE — Transfer of Care (Signed)
Immediate Anesthesia Transfer of Care Note  Patient: Jennifer Mcknight  Procedure(s) Performed: LAPAROSCOPIC CHOLECYSTECTOMY (N/A Abdomen)  Patient Location: PACU  Anesthesia Type:General  Level of Consciousness: drowsy  Airway & Oxygen Therapy: Patient Spontanous Breathing and Patient connected to nasal cannula oxygen  Post-op Assessment: Report given to RN and Post -op Vital signs reviewed and stable  Post vital signs: Reviewed and stable  Last Vitals:  Vitals Value Taken Time  BP 134/84 12/31/18 1103  Temp    Pulse 82 12/31/18 1104  Resp 19 12/31/18 1104  SpO2 96 % 12/31/18 1104  Vitals shown include unvalidated device data.  Last Pain:  Vitals:   12/31/18 0928  PainSc: 0-No pain      Patients Stated Pain Goal: 3 (11/91/47 8295)  Complications: No apparent anesthesia complications

## 2018-12-31 NOTE — Discharge Instructions (Signed)
[facility] ______[facility] °LAPAROSCOPIC SURGERY: POST OP INSTRUCTIONS °Always review your discharge instruction sheet given to you by the facility where your surgery was performed. °IF YOU HAVE DISABILITY OR FAMILY LEAVE FORMS, YOU MUST BRING THEM TO THE OFFICE FOR PROCESSING.   °DO NOT GIVE THEM TO YOUR DOCTOR. ° °1. A prescription for pain medication may be given to you upon discharge.  Take your pain medication as prescribed, if needed.  If narcotic pain medicine is not needed, then you may take acetaminophen (Tylenol) or ibuprofen (Advil) as needed. °2. Take your usually prescribed medications unless otherwise directed. °3. If you need a refill on your pain medication, please contact your pharmacy.  They will contact our office to request authorization. Prescriptions will not be filled after 5pm or on week-ends. °4. You should follow a light diet the first few days after arrival home, such as soup and crackers, etc.  Be sure to include lots of fluids daily. °5. Most patients will experience some swelling and bruising in the area of the incisions.  Ice packs will help.  Swelling and bruising can take several days to resolve.  °6. It is common to experience some constipation if taking pain medication after surgery.  Increasing fluid intake and taking a stool softener (such as Colace) will usually help or prevent this problem from occurring.  A mild laxative (Milk of Magnesia or Miralax) should be taken according to package instructions if there are no bowel movements after 48 hours. °7. Unless discharge instructions indicate otherwise, you may remove your bandages 24-48 hours after surgery, and you may shower at that time.  You may have steri-strips (small skin tapes) in place directly over the incision.  These strips should be left on the skin for 7-10 days.  If your surgeon used skin glue on the incision, you may shower in 24 hours.  The glue will flake off over the next 2-3 weeks.  Any sutures or  staples will be removed at the office during your follow-up visit. °8. ACTIVITIES:  You may resume regular (light) daily activities beginning the next day--such as daily self-care, walking, climbing stairs--gradually increasing activities as tolerated.  You may have sexual intercourse when it is comfortable.  Refrain from any heavy lifting or straining until approved by your doctor. °a. You may drive when you are no longer taking prescription pain medication, you can comfortably wear a seatbelt, and you can safely maneuver your car and apply brakes. °b. RETURN TO WORK:  __________________________________________________________ °9. You should see your doctor in the office for a follow-up appointment approximately 2-3 weeks after your surgery.  Make sure that you call for this appointment within a day or two after you arrive home to insure a convenient appointment time. °10. OTHER INSTRUCTIONS: __________________________________________________________________________________________________________________________ __________________________________________________________________________________________________________________________ °WHEN TO CALL YOUR DOCTOR: °1. Fever over 101.0 °2. Inability to urinate °3. Continued bleeding from incision. °4. Increased pain, redness, or drainage from the incision. °5. Increasing abdominal pain ° °The clinic staff is available to answer your questions during regular business hours.  Please don’t hesitate to call and ask to speak to one of the nurses for clinical concerns.  If you have a medical emergency, go to the nearest emergency room or call 911.  A surgeon from [facility] is always on call at the hospital. °[address], [address], [place], [place]  [address] ? [address] [address], [place], [place]   [address] °[phone] ? [phone] ? FAX [phone] °Web site:   [email or web address] °

## 2018-12-31 NOTE — Op Note (Signed)
12/31/2018  10:52 AM  PATIENT:  Jennifer Mcknight  48 y.o. female  PRE-OPERATIVE DIAGNOSIS:  GALLSTONES  POST-OPERATIVE DIAGNOSIS:  GALLSTONES, CHRONIC CHOLECYSTITIS  PROCEDURE:  Procedure(s): LAPAROSCOPIC CHOLECYSTECTOMY (N/A)  SURGEON:  Surgeon(s) and Role:    * Ralene Ok, MD - Primary  ANESTHESIA:   local and general  EBL:  minimal   BLOOD ADMINISTERED:none  DRAINS: none   LOCAL MEDICATIONS USED:  BUPIVICAINE   SPECIMEN:  Source of Specimen:  GALLBLADDER  DISPOSITION OF SPECIMEN:  PATHOLOGY  COUNTS:  YES  TOURNIQUET:  * No tourniquets in log *  DICTATION: .Dragon Dictation  EBL: <3OZ   Complications: none   Counts: reported as correct x 2   Findings:CHRONICALLY INFLAMED GALLBLADDER  Indications for procedure: Pt is a 75 F with RUQ pain and seen to have gallstones.   Details of the procedure: The patient was taken to the operating and placed in the supine position with bilateral SCDs in place. A time out was called and all facts were verified. A pneumoperitoneum was obtained via A Veress needle technique to a pressure of 46m of mercury. A 52mtrochar was then placed in the right upper quadrant under visualization, and there were no injuries to any abdominal organs. A 11 mm port was then placed in the umbilical region after infiltrating with local anesthesia under direct visualization. A second epigastric port was placed under direct visualization.   The gallbladder was identified and retracted, the peritoneum was then sharply dissected from the gallbladder and this dissection was carried down to Calot's triangle. The cystic duct was identified and dissected circumferentially and seen going into the gallbladder 360.  The cystic artery was dissected away from the surrounding tissues.   The critical angle was obtained.   2 plastic hemoclips were placed proximally one distally and the cystic duct transected. The cystic artery was identified and 2 plastic hemoclips  placed proximally and one distally and transected. We then proceeded to remove the gallbladder off the hepatic fossa with Bovie cautery. A retrieval bag was then placed in the abdomen and gallbladder placed in the bag. The hepatic fossa was then reexamined and hemostasis was achieved with Bovie cautery and was excellent at this portion of the case. The subhepatic fossa and perihepatic fossa was then irrigated until the effluent was clear. The specimen bag and specimen were removed from the abdominal cavity.  The 11 mm trocar fascia was reapproximated with the Endo Close #1 Vicryl x1. The pneumoperitoneum was evacuated and all trochars removed under direct visulalization. The skin was then closed with 4-0 Monocryl and the skin dressed with Dermabond. The patient was awaken from general anesthesia and taken to the recovery room in stable condition.    PLAN OF CARE: Discharge to home after PACU  PATIENT DISPOSITION:  PACU - hemodynamically stable.   Delay start of Pharmacological VTE agent (>24hrs) due to surgical blood loss or risk of bleeding: not applicable

## 2019-01-01 ENCOUNTER — Encounter (HOSPITAL_COMMUNITY): Payer: Self-pay | Admitting: General Surgery

## 2019-01-01 LAB — SURGICAL PATHOLOGY

## 2019-02-05 ENCOUNTER — Inpatient Hospital Stay: Payer: BLUE CROSS/BLUE SHIELD | Attending: Hematology

## 2019-02-05 ENCOUNTER — Inpatient Hospital Stay (HOSPITAL_BASED_OUTPATIENT_CLINIC_OR_DEPARTMENT_OTHER): Payer: BLUE CROSS/BLUE SHIELD | Admitting: Hematology

## 2019-02-05 ENCOUNTER — Telehealth: Payer: Self-pay | Admitting: Hematology

## 2019-02-05 ENCOUNTER — Encounter: Payer: Self-pay | Admitting: Hematology

## 2019-02-05 ENCOUNTER — Other Ambulatory Visit: Payer: Self-pay

## 2019-02-05 VITALS — BP 146/92 | HR 96 | Temp 97.1°F | Resp 18 | Ht 67.0 in | Wt 193.8 lb

## 2019-02-05 DIAGNOSIS — E6609 Other obesity due to excess calories: Secondary | ICD-10-CM | POA: Diagnosis not present

## 2019-02-05 DIAGNOSIS — R591 Generalized enlarged lymph nodes: Secondary | ICD-10-CM | POA: Insufficient documentation

## 2019-02-05 DIAGNOSIS — R7401 Elevation of levels of liver transaminase levels: Secondary | ICD-10-CM

## 2019-02-05 DIAGNOSIS — Z6830 Body mass index (BMI) 30.0-30.9, adult: Secondary | ICD-10-CM | POA: Diagnosis not present

## 2019-02-05 DIAGNOSIS — D473 Essential (hemorrhagic) thrombocythemia: Secondary | ICD-10-CM

## 2019-02-05 DIAGNOSIS — Z79899 Other long term (current) drug therapy: Secondary | ICD-10-CM | POA: Diagnosis not present

## 2019-02-05 DIAGNOSIS — R634 Abnormal weight loss: Secondary | ICD-10-CM | POA: Insufficient documentation

## 2019-02-05 DIAGNOSIS — D75839 Thrombocytosis, unspecified: Secondary | ICD-10-CM

## 2019-02-05 DIAGNOSIS — D72829 Elevated white blood cell count, unspecified: Secondary | ICD-10-CM | POA: Diagnosis not present

## 2019-02-05 DIAGNOSIS — R7989 Other specified abnormal findings of blood chemistry: Secondary | ICD-10-CM | POA: Insufficient documentation

## 2019-02-05 LAB — CMP (CANCER CENTER ONLY)
ALT: 33 U/L (ref 0–44)
AST: 26 U/L (ref 15–41)
Albumin: 5.1 g/dL — ABNORMAL HIGH (ref 3.5–5.0)
Alkaline Phosphatase: 65 U/L (ref 38–126)
Anion gap: 9 (ref 5–15)
BUN: 12 mg/dL (ref 6–20)
CO2: 28 mmol/L (ref 22–32)
Calcium: 10.1 mg/dL (ref 8.9–10.3)
Chloride: 101 mmol/L (ref 98–111)
Creatinine: 0.78 mg/dL (ref 0.44–1.00)
GFR, Est AFR Am: >60 mL/min (ref >60)
GFR, Estimated: >60 mL/min (ref >60)
Glucose, Bld: 103 mg/dL — ABNORMAL HIGH (ref 70–99)
Potassium: 3.5 mmol/L (ref 3.5–5.1)
Sodium: 138 mmol/L (ref 135–145)
Total Bilirubin: 0.4 mg/dL (ref 0.3–1.2)
Total Protein: 8.3 g/dL — ABNORMAL HIGH (ref 6.5–8.1)

## 2019-02-05 LAB — CBC WITH DIFFERENTIAL (CANCER CENTER ONLY)
Abs Immature Granulocytes: 0.02 10*3/uL (ref 0.00–0.07)
Basophils Absolute: 0.1 10*3/uL (ref 0.0–0.1)
Basophils Relative: 1 %
Eosinophils Absolute: 0.1 10*3/uL (ref 0.0–0.5)
Eosinophils Relative: 1 %
HCT: 45.3 % (ref 36.0–46.0)
Hemoglobin: 15 g/dL (ref 12.0–15.0)
Immature Granulocytes: 0 %
Lymphocytes Relative: 34 %
Lymphs Abs: 3.9 10*3/uL (ref 0.7–4.0)
MCH: 29 pg (ref 26.0–34.0)
MCHC: 33.1 g/dL (ref 30.0–36.0)
MCV: 87.6 fL (ref 80.0–100.0)
Monocytes Absolute: 0.8 10*3/uL (ref 0.1–1.0)
Monocytes Relative: 7 %
Neutro Abs: 6.6 10*3/uL (ref 1.7–7.7)
Neutrophils Relative %: 57 %
Platelet Count: 540 10*3/uL — ABNORMAL HIGH (ref 150–400)
RBC: 5.17 MIL/uL — ABNORMAL HIGH (ref 3.87–5.11)
RDW: 12.7 % (ref 11.5–15.5)
WBC Count: 11.4 10*3/uL — ABNORMAL HIGH (ref 4.0–10.5)
nRBC: 0 % (ref 0.0–0.2)

## 2019-02-05 LAB — SAVE SMEAR(SSMR), FOR PROVIDER SLIDE REVIEW

## 2019-02-05 LAB — LACTATE DEHYDROGENASE: LDH: 210 U/L — ABNORMAL HIGH (ref 98–192)

## 2019-02-05 NOTE — Progress Notes (Signed)
[facility] OFFICE PROGRESS NOTE  Patient Care Team: Janith Lima, MD as PCP - General  HEME/ONC OVERVIEW: 1. Thrombocytosis -Most likely reactive in the setting of chronic cholecystitis (lap chole in 12/2018) -Plts ~500k since 2016, w/ intermittent mild leukocytosis (WBC 12-13k)  BCR/ABL FISH negative   MPN NGS, including JAK2 and CAL-R, negative   ESR elevated, CRP normal  -On observation   ASSESSMENT & PLAN:   Thrombocytosis -Most likely reactive, possibly due to chronic cholecystitis and stress -S/p cholecystectomy 12/2018; path showed chronic cholecystitis and cholelithiasis, no evidence of malignancy -Plts 540k today, stable -Given the negative BCR/ABL FISH and MPN molecular studies, primary hematologic disorder, such as CML or ET, is essentially ruled out -Clinically, she denies any suspicious symptoms -We will plan to monitor the blood counts q6month for any interval changes  -I counseled the the patient on some of the suspicious symptoms, including unexplained persistent fever, night sweats, weight loss, or lymphadenopathy, for which she should seek further evaluation -I also counseled the patient on the importance of keeping up-to-date with age-appropriate cancer screening, including colonoscopy, mammogram, and the pelvic exam  Leukocytosis -Likely reactive; intermittent leukocytosis since at least 2015 -WBC 11.4k today, slightly improving -Clinically, patient denies any symptoms of infection -Peripheral blood flow cytometry pending -We will monitor it for now  Orders Placed This Encounter  Procedures  . CBC w/ diff    Standing Status:   Future    Standing Expiration Date:   03/11/2020  . CMP    Standing Status:   Future    Standing Expiration Date:   03/11/2020  . Save Smear (SSMR)    Standing Status:   Future    Standing Expiration Date:   02/05/2020  . LDH    Standing Status:   Future    Standing Expiration Date:   03/11/2020   All  questions were answered. The patient knows to call the clinic with any problems, questions or concerns. No barriers to learning was detected.  Return in 6 months for labs and clinic follow-up.  YTish Men MD 02/05/2019 10:55 AM  CHIEF COMPLAINT: "I am doing fine"  INTERVAL HISTORY: [name] clinic for follow-up of chronic thrombocytosis.  Patient underwent laparoscopic cholecystectomy 12/2018, and recovered well from the surgery.  She denies any constitutional symptoms, chest pain, dyspnea, abdominal pain, nausea, vomiting, or diarrhea.  She denies any other complaint today.  REVIEW OF SYSTEMS:   Constitutional: ( - ) fevers, ( - )  chills , ( - ) night sweats Eyes: ( - ) blurriness of vision, ( - ) double vision, ( - ) watery eyes Ears, nose, mouth, throat, and face: ( - ) mucositis, ( - ) sore throat Respiratory: ( - ) cough, ( - ) dyspnea, ( - ) wheezes Cardiovascular: ( - ) palpitation, ( - ) chest discomfort, ( - ) lower extremity swelling Gastrointestinal:  ( - ) nausea, ( - ) heartburn, ( - ) change in bowel habits Skin: ( - ) abnormal skin rashes Lymphatics: ( - ) new lymphadenopathy, ( - ) easy bruising Neurological: ( - ) numbness, ( - ) tingling, ( - ) new weaknesses Behavioral/Psych: ( - ) mood change, ( - ) new changes  All other systems were reviewed with the patient and are negative.  I have reviewed the past medical history, past surgical history, social history and family history with the patient and they are unchanged from previous note.  ALLERGIES:  has  No Known Allergies.  MEDICATIONS:  Current Outpatient Medications  Medication Sig Dispense Refill  . Levomilnacipran HCl ER (FETZIMA) 80 MG CP24 Take 1 capsule by mouth daily. 90 capsule 1  . Vilazodone HCl (VIIBRYD) 40 MG TABS Take 1 tablet (40 mg total) by mouth daily. 90 tablet 1  . Ascorbic Acid (VITAMIN C ADULT GUMMIES) 125 MG CHEW Chew 250 mg by mouth daily.    . calcium carbonate (TUMS - DOSED IN MG  ELEMENTAL CALCIUM) 500 MG chewable tablet Chew 1 tablet by mouth daily as needed for indigestion or heartburn.    Marland Kitchen ELDERBERRY PO Take 2 capsules by mouth daily.    . fluticasone (FLONASE) 50 MCG/ACT nasal spray Place 1 spray into both nostrils daily as needed for allergies or rhinitis.    . Multiple Vitamin (MULTIVITAMIN WITH MINERALS) TABS tablet Take 1 tablet by mouth daily.     No current facility-administered medications for this visit.    PHYSICAL EXAMINATION: ECOG PERFORMANCE STATUS: 0 - Asymptomatic  Today's Vitals   02/05/19 1034  BP: (!) 146/92  Pulse: 96  Resp: 18  Temp: (!) 97.1 F (36.2 C)  TempSrc: Temporal  SpO2: 100%  Weight: 193 lb 12.8 oz (87.9 kg)  Height: 5' 7"  (1.702 m)  PainSc: 0-No pain   Body mass index is 30.35 kg/m.  Filed Weights   02/05/19 1034  Weight: 193 lb 12.8 oz (87.9 kg)    GENERAL: alert, no distress and comfortable SKIN: skin color, texture, turgor are normal, no rashes or significant lesions EYES: conjunctiva are pink and non-injected, sclera clear OROPHARYNX: no exudate, no erythema; lips, buccal mucosa, and tongue normal  NECK: supple, non-tender LUNGS: clear to auscultation with normal breathing effort HEART: regular rate & rhythm and no murmurs and no lower extremity edema ABDOMEN: soft, non-tender, non-distended, normal bowel sounds Musculoskeletal: no cyanosis of digits and no clubbing  PSYCH: alert & oriented x 3, fluent speech  LABORATORY DATA:  I have reviewed the data as listed    Component Value Date/Time   NA 138 02/05/2019 1014   K 3.5 02/05/2019 1014   CL 101 02/05/2019 1014   CO2 28 02/05/2019 1014   GLUCOSE 103 (H) 02/05/2019 1014   BUN 12 02/05/2019 1014   CREATININE 0.78 02/05/2019 1014   CALCIUM 10.1 02/05/2019 1014   PROT 8.3 (H) 02/05/2019 1014   ALBUMIN 5.1 (H) 02/05/2019 1014   AST 26 02/05/2019 1014   ALT 33 02/05/2019 1014   ALKPHOS 65 02/05/2019 1014   BILITOT 0.4 02/05/2019 1014   GFRNONAA >60  02/05/2019 1014   GFRAA >60 02/05/2019 1014    No results found for: SPEP, UPEP  Lab Results  Component Value Date   WBC 11.4 (H) 02/05/2019   NEUTROABS 6.6 02/05/2019   HGB 15.0 02/05/2019   HCT 45.3 02/05/2019   MCV 87.6 02/05/2019   PLT 540 (H) 02/05/2019      Chemistry      Component Value Date/Time   NA 138 02/05/2019 1014   K 3.5 02/05/2019 1014   CL 101 02/05/2019 1014   CO2 28 02/05/2019 1014   BUN 12 02/05/2019 1014   CREATININE 0.78 02/05/2019 1014      Component Value Date/Time   CALCIUM 10.1 02/05/2019 1014   ALKPHOS 65 02/05/2019 1014   AST 26 02/05/2019 1014   ALT 33 02/05/2019 1014   BILITOT 0.4 02/05/2019 1014       RADIOGRAPHIC STUDIES: I have personally reviewed the radiological  images as listed below and agreed with the findings in the report. No results found.

## 2019-02-05 NOTE — Telephone Encounter (Signed)
Appointments scheduled calednar declined  Patient is on My Chart per 12/15 los

## 2019-02-06 LAB — SURGICAL PATHOLOGY

## 2019-02-07 LAB — FLOW CYTOMETRY

## 2019-03-01 ENCOUNTER — Other Ambulatory Visit: Payer: Self-pay | Admitting: Internal Medicine

## 2019-03-01 DIAGNOSIS — F329 Major depressive disorder, single episode, unspecified: Secondary | ICD-10-CM

## 2019-03-01 MED ORDER — FETZIMA 80 MG PO CP24: 1.0000 | ORAL_CAPSULE | Freq: Every day | ORAL | 0 refills | Status: DC

## 2019-03-01 MED ORDER — VIIBRYD 40 MG PO TABS: ORAL_TABLET | ORAL | 0 refills | Status: DC

## 2019-03-01 NOTE — Telephone Encounter (Signed)
Medication Refill - Medication: Levomilnacipran HCl ER ([place]ortches) 80 MG CP24 VIIBRYD 40 MG TABS    Preferred Pharmacy (with phone number or street name):  [id number] [address], [place] Phone:  [phone]  Fax:  [phone]       Agent: Please be advised that RX refills may take up to 3 business days. We ask that you follow-up with your pharmacy.

## 2019-04-20 ENCOUNTER — Other Ambulatory Visit: Payer: Self-pay | Admitting: Internal Medicine

## 2019-04-20 DIAGNOSIS — F329 Major depressive disorder, single episode, unspecified: Secondary | ICD-10-CM

## 2019-05-05 ENCOUNTER — Other Ambulatory Visit: Payer: Self-pay | Admitting: Internal Medicine

## 2019-05-05 DIAGNOSIS — F329 Major depressive disorder, single episode, unspecified: Secondary | ICD-10-CM

## 2019-05-23 ENCOUNTER — Other Ambulatory Visit: Payer: Self-pay | Admitting: Internal Medicine

## 2019-05-23 DIAGNOSIS — F329 Major depressive disorder, single episode, unspecified: Secondary | ICD-10-CM

## 2019-08-06 ENCOUNTER — Other Ambulatory Visit: Payer: BLUE CROSS/BLUE SHIELD

## 2019-08-06 ENCOUNTER — Inpatient Hospital Stay: Payer: BC Managed Care – PPO | Admitting: Hematology & Oncology

## 2019-08-06 ENCOUNTER — Inpatient Hospital Stay: Payer: BC Managed Care – PPO | Attending: Hematology & Oncology

## 2019-08-06 ENCOUNTER — Ambulatory Visit: Payer: BLUE CROSS/BLUE SHIELD | Admitting: Hematology

## 2019-10-08 ENCOUNTER — Other Ambulatory Visit: Payer: Self-pay | Admitting: Internal Medicine

## 2019-10-08 ENCOUNTER — Encounter: Payer: Self-pay | Admitting: Internal Medicine

## 2019-10-08 DIAGNOSIS — E6609 Other obesity due to excess calories: Secondary | ICD-10-CM | POA: Insufficient documentation

## 2019-10-08 DIAGNOSIS — F329 Major depressive disorder, single episode, unspecified: Secondary | ICD-10-CM

## 2019-10-08 DIAGNOSIS — E66811 Obesity, class 1: Secondary | ICD-10-CM | POA: Insufficient documentation

## 2019-10-08 MED ORDER — NOVOFINE 32G X 6 MM MISC: 1.0000 | 0 refills | Status: DC

## 2019-10-08 MED ORDER — WEGOVY 0.25 MG/0.5ML ~~LOC~~ SOAJ: 0.2500 mg | SUBCUTANEOUS | 0 refills | Status: DC

## 2019-10-08 MED ORDER — VIIBRYD 20 MG PO TABS: 1.0000 | ORAL_TABLET | Freq: Every day | ORAL | 0 refills | Status: DC

## 2019-10-08 NOTE — Addendum Note (Signed)
Addended by: Janith Lima on: 10/08/2019 04:43 PM   Modules accepted: Orders

## 2019-10-15 ENCOUNTER — Telehealth: Payer: Self-pay

## 2019-10-15 NOTE — Telephone Encounter (Signed)
Key: [id number]

## 2019-12-05 ENCOUNTER — Other Ambulatory Visit: Payer: Self-pay | Admitting: Internal Medicine

## 2019-12-05 DIAGNOSIS — F329 Major depressive disorder, single episode, unspecified: Secondary | ICD-10-CM

## 2019-12-11 ENCOUNTER — Other Ambulatory Visit: Payer: Self-pay | Admitting: Internal Medicine

## 2019-12-11 DIAGNOSIS — F329 Major depressive disorder, single episode, unspecified: Secondary | ICD-10-CM

## 2019-12-23 ENCOUNTER — Other Ambulatory Visit: Payer: Self-pay | Admitting: Internal Medicine

## 2019-12-23 ENCOUNTER — Encounter: Payer: Self-pay | Admitting: Internal Medicine

## 2019-12-23 DIAGNOSIS — F329 Major depressive disorder, single episode, unspecified: Secondary | ICD-10-CM

## 2019-12-23 DIAGNOSIS — Z6830 Body mass index (BMI) 30.0-30.9, adult: Secondary | ICD-10-CM

## 2019-12-23 DIAGNOSIS — E6609 Other obesity due to excess calories: Secondary | ICD-10-CM

## 2019-12-23 MED ORDER — VIIBRYD 20 MG PO TABS: 1.0000 | ORAL_TABLET | Freq: Every day | ORAL | 0 refills | Status: DC

## 2019-12-23 MED ORDER — WEGOVY 0.25 MG/0.5ML ~~LOC~~ SOAJ: 0.5000 mg | SUBCUTANEOUS | 0 refills | Status: DC

## 2019-12-26 ENCOUNTER — Telehealth: Payer: Self-pay | Admitting: Internal Medicine

## 2019-12-26 NOTE — Telephone Encounter (Signed)
I did not speak with her. But it shows [employer], Active.

## 2019-12-26 NOTE — Telephone Encounter (Signed)
    Patient requesting prior auth for Vilazodone HCl (VIIBRYD) 20 MG TABS Her insurance will not cover  Also are there samples available

## 2019-12-27 NOTE — Telephone Encounter (Signed)
I have called the pt's pharmacy to ask for the [place] ([phone]), PCN (ADV), and group #( [id number]). After receiving that information I was able to initiate the PA.   Key: [id number]

## 2019-12-27 NOTE — Telephone Encounter (Signed)
Per CoverMyMeds PA was denied.   Determination sent to scan.

## 2020-01-01 NOTE — Telephone Encounter (Signed)
Called pt, LVM.   

## 2020-01-30 ENCOUNTER — Other Ambulatory Visit: Payer: Self-pay | Admitting: Internal Medicine

## 2020-01-30 ENCOUNTER — Encounter: Payer: Self-pay | Admitting: Internal Medicine

## 2020-01-30 DIAGNOSIS — Z6830 Body mass index (BMI) 30.0-30.9, adult: Secondary | ICD-10-CM

## 2020-01-30 DIAGNOSIS — E6609 Other obesity due to excess calories: Secondary | ICD-10-CM

## 2020-01-30 MED ORDER — WEGOVY 0.5 MG/0.5ML ~~LOC~~ SOAJ: 0.5000 mg | SUBCUTANEOUS | 0 refills | Status: DC

## 2020-02-12 ENCOUNTER — Encounter: Payer: Self-pay | Admitting: Internal Medicine

## 2020-02-13 ENCOUNTER — Other Ambulatory Visit: Payer: Self-pay | Admitting: Internal Medicine

## 2020-02-13 DIAGNOSIS — F329 Major depressive disorder, single episode, unspecified: Secondary | ICD-10-CM

## 2020-02-13 MED ORDER — VIIBRYD 20 MG PO TABS: 1.0000 | ORAL_TABLET | Freq: Every day | ORAL | 0 refills | Status: DC

## 2020-02-17 ENCOUNTER — Other Ambulatory Visit: Payer: Self-pay

## 2020-02-17 ENCOUNTER — Encounter: Payer: Self-pay | Admitting: Internal Medicine

## 2020-02-17 ENCOUNTER — Ambulatory Visit (INDEPENDENT_AMBULATORY_CARE_PROVIDER_SITE_OTHER): Payer: BC Managed Care – PPO | Admitting: Internal Medicine

## 2020-02-17 VITALS — BP 126/86 | HR 86 | Temp 98.5°F | Ht 67.0 in | Wt 192.0 lb

## 2020-02-17 DIAGNOSIS — Z0000 Encounter for general adult medical examination without abnormal findings: Secondary | ICD-10-CM

## 2020-02-17 DIAGNOSIS — Z0001 Encounter for general adult medical examination with abnormal findings: Secondary | ICD-10-CM | POA: Insufficient documentation

## 2020-02-17 DIAGNOSIS — D72829 Elevated white blood cell count, unspecified: Secondary | ICD-10-CM | POA: Diagnosis not present

## 2020-02-17 DIAGNOSIS — D75839 Thrombocytosis, unspecified: Secondary | ICD-10-CM

## 2020-02-17 DIAGNOSIS — Z1211 Encounter for screening for malignant neoplasm of colon: Secondary | ICD-10-CM

## 2020-02-17 DIAGNOSIS — Z1231 Encounter for screening mammogram for malignant neoplasm of breast: Secondary | ICD-10-CM

## 2020-02-17 DIAGNOSIS — I10 Essential (primary) hypertension: Secondary | ICD-10-CM | POA: Diagnosis not present

## 2020-02-17 DIAGNOSIS — R7989 Other specified abnormal findings of blood chemistry: Secondary | ICD-10-CM | POA: Diagnosis not present

## 2020-02-17 DIAGNOSIS — F329 Major depressive disorder, single episode, unspecified: Secondary | ICD-10-CM

## 2020-02-17 LAB — PROTIME-INR
INR: 1 ratio (ref 0.8–1.0)
Prothrombin Time: 11.6 s (ref 9.6–13.1)

## 2020-02-17 LAB — CBC WITH DIFFERENTIAL/PLATELET
Basophils Absolute: 0.1 10*3/uL (ref 0.0–0.1)
Basophils Relative: 1.1 % (ref 0.0–3.0)
Eosinophils Absolute: 0.3 10*3/uL (ref 0.0–0.7)
Eosinophils Relative: 2.8 % (ref 0.0–5.0)
HCT: 43.6 % (ref 36.0–46.0)
Hemoglobin: 14.4 g/dL (ref 12.0–15.0)
Lymphocytes Relative: 40.3 % (ref 12.0–46.0)
Lymphs Abs: 4.2 10*3/uL — ABNORMAL HIGH (ref 0.7–4.0)
MCHC: 33.1 g/dL (ref 30.0–36.0)
MCV: 85.2 fl (ref 78.0–100.0)
Monocytes Absolute: 0.6 10*3/uL (ref 0.1–1.0)
Monocytes Relative: 5.6 % (ref 3.0–12.0)
Neutro Abs: 5.2 10*3/uL (ref 1.4–7.7)
Neutrophils Relative %: 50.2 % (ref 43.0–77.0)
Platelets: 485 10*3/uL — ABNORMAL HIGH (ref 150.0–400.0)
RBC: 5.11 Mil/uL (ref 3.87–5.11)
RDW: 14.2 % (ref 11.5–15.5)
WBC: 10.3 10*3/uL (ref 4.0–10.5)

## 2020-02-17 LAB — BASIC METABOLIC PANEL
BUN: 12 mg/dL (ref 6–23)
CO2: 27 mEq/L (ref 19–32)
Calcium: 9.8 mg/dL (ref 8.4–10.5)
Chloride: 103 mEq/L (ref 96–112)
Creatinine, Ser: 0.77 mg/dL (ref 0.40–1.20)
GFR: 91.37 mL/min (ref >60.00)
Glucose, Bld: 102 mg/dL — ABNORMAL HIGH (ref 70–99)
Potassium: 4 mEq/L (ref 3.5–5.1)
Sodium: 138 mEq/L (ref 135–145)

## 2020-02-17 LAB — FERRITIN: Ferritin: 17.3 ng/mL (ref 10.0–291.0)

## 2020-02-17 LAB — HEPATIC FUNCTION PANEL
ALT: 40 U/L — ABNORMAL HIGH (ref 0–35)
AST: 33 U/L (ref 0–37)
Albumin: 4.6 g/dL (ref 3.5–5.2)
Alkaline Phosphatase: 79 U/L (ref 39–117)
Bilirubin, Direct: 0.1 mg/dL (ref 0.0–0.3)
Total Bilirubin: 0.3 mg/dL (ref 0.2–1.2)
Total Protein: 8 g/dL (ref 6.0–8.3)

## 2020-02-17 LAB — LIPID PANEL
Cholesterol: 229 mg/dL — ABNORMAL HIGH (ref 0–200)
HDL: 52.8 mg/dL (ref >39.00)
LDL Cholesterol: 145 mg/dL — ABNORMAL HIGH (ref 0–99)
NonHDL: 175.98
Total CHOL/HDL Ratio: 4
Triglycerides: 154 mg/dL — ABNORMAL HIGH (ref 0.0–149.0)
VLDL: 30.8 mg/dL (ref 0.0–40.0)

## 2020-02-17 LAB — IRON: Iron: 87 ug/dL (ref 42–145)

## 2020-02-17 NOTE — Progress Notes (Signed)
Subjective:  Patient ID: Jennifer Mcknight, female    DOB: 18-Jul-1970  Age: 49 y.o. MRN: [id number]  CC: Annual Exam  This visit occurred during the SARS-CoV-2 public health emergency.  Safety protocols were in place, including screening questions prior to the visit, additional usage of staff PPE, and extensive cleaning of exam room while observing appropriate contact time as indicated for disinfecting solutions.    HPI [name] presents for a CPX.  She is doing well on the combination of vilazodone and fetzima.  Since I last saw her she has separated from her husband and is feeling much better.  She tells me that her mood has been normal recently.  She has a history of elevated white cell count and elevated platelet count.  She denies episodes of bleeding, bruising, night sweats, fever, chills, lymphadenopathy, or abdominal pain.  She is controlling her blood pressure with lifestyle modifications.  She is active and denies any recent episodes of headache, blurred vision, chest pain, shortness of breath, palpitations, edema, or fatigue.  She denies abdominal pain, nausea, vomiting, icterus, or weight loss.  Outpatient Medications Prior to Visit  Medication Sig Dispense Refill  . FETZIMA 80 MG CP24 TAKE 1 CAPSULE BY MOUTH DAILY 90 capsule 1  . Insulin Pen Needle (NOVOFINE) 32G X 6 MM MISC 1 Act by Does not apply route once a week. 50 each 0  . Multiple Vitamin (MULTIVITAMIN WITH MINERALS) TABS tablet Take 1 tablet by mouth daily.    . Semaglutide-Weight Management (WEGOVY) 0.5 MG/0.5ML SOAJ Inject 0.5 mg into the skin once a week. 2 mL 0  . Vilazodone HCl (VIIBRYD) 20 MG TABS Take 1 tablet (20 mg total) by mouth daily. 90 tablet 0  . Ascorbic Acid (VITAMIN C ADULT GUMMIES) 125 MG CHEW Chew 250 mg by mouth daily.    . calcium carbonate (TUMS - DOSED IN MG ELEMENTAL CALCIUM) 500 MG chewable tablet Chew 1 tablet by mouth daily as needed for indigestion or heartburn.    Marland Kitchen ELDERBERRY PO Take 2  capsules by mouth daily.    . fluticasone (FLONASE) 50 MCG/ACT nasal spray Place 1 spray into both nostrils daily as needed for allergies or rhinitis.     No facility-administered medications prior to visit.    ROS Review of Systems  Constitutional: Negative for appetite change, diaphoresis, fatigue and unexpected weight change.  HENT: Negative.  Negative for sore throat, trouble swallowing and voice change.   Eyes: Negative.   Respiratory: Negative for cough, chest tightness, shortness of breath and wheezing.   Cardiovascular: Negative for chest pain, palpitations and leg swelling.  Gastrointestinal: Negative for abdominal pain, constipation, diarrhea, nausea and vomiting.  Endocrine: Negative.   Genitourinary: Negative.  Negative for difficulty urinating, dysuria, flank pain, frequency and hematuria.  Musculoskeletal: Negative.   Skin: Negative.  Negative for pallor.  Neurological: Negative.  Negative for dizziness, weakness, light-headedness and headaches.  Hematological: Negative for adenopathy. Does not bruise/bleed easily.  Psychiatric/Behavioral: Negative.     Objective:  BP 126/86   Pulse 86   Temp 98.5 F (36.9 C) (Oral)   Ht 5' 7"  (1.702 m)   Wt 192 lb (87.1 kg)   SpO2 99%   BMI 30.07 kg/m   BP Readings from Last 3 Encounters:  02/17/20 126/86  02/05/19 (!) 146/92  12/31/18 106/64    Wt Readings from Last 3 Encounters:  02/17/20 192 lb (87.1 kg)  02/05/19 193 lb 12.8 oz (87.9 kg)  12/31/18 196 lb (88.9  kg)    Physical Exam Vitals reviewed.  Constitutional:      Appearance: Normal appearance.  HENT:     Mouth/Throat:     Mouth: Mucous membranes are moist.  Eyes:     General: No scleral icterus.    Conjunctiva/sclera: Conjunctivae normal.  Cardiovascular:     Rate and Rhythm: Normal rate and regular rhythm.     Heart sounds: No murmur heard.   Pulmonary:     Effort: Pulmonary effort is normal.     Breath sounds: No stridor. No wheezing, rhonchi  or rales.  Abdominal:     General: Abdomen is flat. Bowel sounds are normal. There is no distension.     Palpations: Abdomen is soft. There is no fluid wave, hepatomegaly, splenomegaly or mass.     Tenderness: There is no abdominal tenderness.     Hernia: No hernia is present.  Musculoskeletal:        General: Normal range of motion.     Cervical back: Neck supple.     Right lower leg: No edema.     Left lower leg: No edema.  Lymphadenopathy:     Cervical: No cervical adenopathy.  Skin:    General: Skin is warm and dry.     Coloration: Skin is not pale.     Findings: No rash.  Neurological:     General: No focal deficit present.     Mental Status: She is alert and oriented to person, place, and time. Mental status is at baseline.  Psychiatric:        Mood and Affect: Mood normal.        Behavior: Behavior normal.     Lab Results  Component Value Date   WBC 10.3 02/17/2020   HGB 14.4 02/17/2020   HCT 43.6 02/17/2020   PLT 485.0 (H) 02/17/2020   GLUCOSE 102 (H) 02/17/2020   CHOL 229 (H) 02/17/2020   TRIG 154.0 (H) 02/17/2020   HDL 52.80 02/17/2020   LDLDIRECT 123.1 03/21/2013   LDLCALC 145 (H) 02/17/2020   ALT 40 (H) 02/17/2020   AST 33 02/17/2020   NA 138 02/17/2020   K 4.0 02/17/2020   CL 103 02/17/2020   CREATININE 0.77 02/17/2020   BUN 12 02/17/2020   CO2 27 02/17/2020   TSH 2.32 09/18/2018   INR 1.0 02/17/2020   IMPRESSION: 1. Cholelithiasis and probable adenomyomatosis. No findings for acute cholecystitis. 2. No intra or extrahepatic biliary dilatation. 3. Unremarkable sonographic appearance of the liver, spleen, pancreas and both kidneys.  Electronically Signed   By: Marijo Sanes M.D.   On: 11/14/2018 17:17  Assessment & Plan:   Shebra was seen today for annual exam.  Diagnoses and all orders for this visit:  Essential hypertension- Her blood pressure is adequately well controlled with lifestyle modifications. -     CBC with  Differential/Platelet; Future -     Basic metabolic panel; Future -     Basic metabolic panel -     CBC with Differential/Platelet  Thrombocytosis- Her platelet count is lower than it was before.  She is not anemic and her other cell lines are normal.  This is a benign finding but I will continue to follow and monitor for the development of lymphoproliferative disease. -     CBC with Differential/Platelet; Future -     Iron; Future -     Ferritin; Future -     Ferritin -     Iron -  CBC with Differential/Platelet  Leukocytosis, unspecified type- Her white cell count is normal now. -     CBC with Differential/Platelet; Future -     CBC with Differential/Platelet  Elevated LFTs- Her LFTs are mildly elevated.  This is likely NASH.  I recommended that she get vaccinated against hepatitis A and B.  Her MELD score is reassuring. -     Hepatic function panel; Future -     Protime-INR; Future -     Hepatitis B surface antibody,quantitative; Future -     Hepatitis A antibody, total; Future -     Hepatitis A antibody, total -     Hepatitis B surface antibody,quantitative -     Protime-INR -     Hepatic function panel  Encounter for general adult medical examination with abnormal findings- Exam completed, labs reviewed - Statin therapy is not indicated, she refused a flu vaccine, cancer screenings addressed, patient education was given. -     Lipid panel; Future -     Lipid panel  Colon cancer screening -     Cologuard  Visit for screening mammogram -     MM DIGITAL SCREENING BILATERAL; Future   I have discontinued Malayah Bonini's ELDERBERRY PO, fluticasone, Vitamin C Adult Gummies, and calcium carbonate. I am also having her maintain her multivitamin with minerals, NovoFine, Fetzima, Wegovy, and Viibryd.  No orders of the defined types were placed in this encounter.  In addition to time spent on CPE, I spent 50 minutes in preparing to see the patient by review of recent labs, imaging  and procedures, obtaining and reviewing separately obtained history, communicating with the patient and family or caregiver, ordering medications, tests or procedures, and documenting clinical information in the EHR including the differential Dx, treatment, and any further evaluation and other management of 1. Essential hypertension 2. Thrombocytosis 3. Leukocytosis, unspecified type 4. Elevated LFTs 5. Major depression, chronic     Follow-up: Return in about 6 months (around 08/17/2020).  Scarlette Calico, MD

## 2020-02-17 NOTE — Patient Instructions (Signed)
Health Maintenance, Female Adopting a healthy lifestyle and getting preventive care are important in promoting health and wellness. Ask your health care provider about:  The right schedule for you to have regular tests and exams.  Things you can do on your own to prevent diseases and keep yourself healthy. What should I know about diet, weight, and exercise? Eat a healthy diet   Eat a diet that includes plenty of vegetables, fruits, low-fat dairy products, and lean protein.  Do not eat a lot of foods that are high in solid fats, added sugars, or sodium. Maintain a healthy weight Body mass index (BMI) is used to identify weight problems. It estimates body fat based on height and weight. Your health care provider can help determine your BMI and help you achieve or maintain a healthy weight. Get regular exercise Get regular exercise. This is one of the most important things you can do for your health. Most adults should:  Exercise for at least 150 minutes each week. The exercise should increase your heart rate and make you sweat (moderate-intensity exercise).  Do strengthening exercises at least twice a week. This is in addition to the moderate-intensity exercise.  Spend less time sitting. Even light physical activity can be beneficial. Watch cholesterol and blood lipids Have your blood tested for lipids and cholesterol at 48 years of age, then have this test every 5 years. Have your cholesterol levels checked more often if:  Your lipid or cholesterol levels are high.  You are older than 48 years of age.  You are at high risk for heart disease. What should I know about cancer screening? Depending on your health history and family history, you may need to have cancer screening at various ages. This may include screening for:  Breast cancer.  Cervical cancer.  Colorectal cancer.  Skin cancer.  Lung cancer. What should I know about heart disease, diabetes, and high blood  pressure? Blood pressure and heart disease  High blood pressure causes heart disease and increases the risk of stroke. This is more likely to develop in people who have high blood pressure readings, are of African descent, or are overweight.  Have your blood pressure checked: ? Every 3-5 years if you are 18-39 years of age. ? Every year if you are 40 years old or older. Diabetes Have regular diabetes screenings. This checks your fasting blood sugar level. Have the screening done:  Once every three years after age 40 if you are at a normal weight and have a low risk for diabetes.  More often and at a younger age if you are overweight or have a high risk for diabetes. What should I know about preventing infection? Hepatitis B If you have a higher risk for hepatitis B, you should be screened for this virus. Talk with your health care provider to find out if you are at risk for hepatitis B infection. Hepatitis C Testing is recommended for:  Everyone born from 1945 through 1965.  Anyone with known risk factors for hepatitis C. Sexually transmitted infections (STIs)  Get screened for STIs, including gonorrhea and chlamydia, if: ? You are sexually active and are younger than 48 years of age. ? You are older than 48 years of age and your health care provider tells you that you are at risk for this type of infection. ? Your sexual activity has changed since you were last screened, and you are at increased risk for chlamydia or gonorrhea. Ask your health care provider if   you are at risk.  Ask your health care provider about whether you are at high risk for HIV. Your health care provider may recommend a prescription medicine to help prevent HIV infection. If you choose to take medicine to prevent HIV, you should first get tested for HIV. You should then be tested every 3 months for as long as you are taking the medicine. Pregnancy  If you are about to stop having your period (premenopausal) and  you may become pregnant, seek counseling before you get pregnant.  Take 400 to 800 micrograms (mcg) of folic acid every day if you become pregnant.  Ask for birth control (contraception) if you want to prevent pregnancy. Osteoporosis and menopause Osteoporosis is a disease in which the bones lose minerals and strength with aging. This can result in bone fractures. If you are 65 years old or older, or if you are at risk for osteoporosis and fractures, ask your health care provider if you should:  Be screened for bone loss.  Take a calcium or vitamin D supplement to lower your risk of fractures.  Be given hormone replacement therapy (HRT) to treat symptoms of menopause. Follow these instructions at home: Lifestyle  Do not use any products that contain nicotine or tobacco, such as cigarettes, e-cigarettes, and chewing tobacco. If you need help quitting, ask your health care provider.  Do not use street drugs.  Do not share needles.  Ask your health care provider for help if you need support or information about quitting drugs. Alcohol use  Do not drink alcohol if: ? Your health care provider tells you not to drink. ? You are pregnant, may be pregnant, or are planning to become pregnant.  If you drink alcohol: ? Limit how much you use to 0-1 drink a day. ? Limit intake if you are breastfeeding.  Be aware of how much alcohol is in your drink. In the [place] one drink equals one 12 oz bottle of beer (355 mL), one 5 oz glass of wine (148 mL), or one 1 oz glass of hard liquor (44 mL). General instructions  Schedule regular health, dental, and eye exams.  Stay current with your vaccines.  Tell your health care provider if: ? You often feel depressed. ? You have ever been abused or do not feel safe at home. Summary  Adopting a healthy lifestyle and getting preventive care are important in promoting health and wellness.  Follow your health care provider's instructions about healthy  diet, exercising, and getting tested or screened for diseases.  Follow your health care provider's instructions on monitoring your cholesterol and blood pressure. This information is not intended to replace advice given to you by your health care provider. Make sure you discuss any questions you have with your health care provider. Document Revised: 01/31/2018 Document Reviewed: 01/31/2018 Elsevier Patient Education  2020 [employer]  

## 2020-02-18 LAB — HEPATITIS A ANTIBODY, TOTAL: Hepatitis A AB,Total: NONREACTIVE

## 2020-02-18 LAB — HEPATITIS B SURFACE ANTIBODY, QUANTITATIVE: Hep B S AB Quant (Post): <5 m[IU]/mL — ABNORMAL LOW (ref >=10)

## 2020-02-23 NOTE — Assessment & Plan Note (Signed)
She is doing well on the current doses of the vilazodone and Fetzima.  Will continue both.

## 2020-02-27 ENCOUNTER — Other Ambulatory Visit: Payer: Self-pay | Admitting: Internal Medicine

## 2020-02-27 DIAGNOSIS — Z6830 Body mass index (BMI) 30.0-30.9, adult: Secondary | ICD-10-CM

## 2020-02-27 DIAGNOSIS — E6609 Other obesity due to excess calories: Secondary | ICD-10-CM

## 2020-02-27 MED ORDER — WEGOVY 1 MG/0.5ML ~~LOC~~ SOAJ: 1.0000 mg | SUBCUTANEOUS | 0 refills | Status: DC

## 2020-04-04 ENCOUNTER — Other Ambulatory Visit: Payer: Self-pay | Admitting: Internal Medicine

## 2020-04-04 DIAGNOSIS — E6609 Other obesity due to excess calories: Secondary | ICD-10-CM

## 2020-04-05 ENCOUNTER — Other Ambulatory Visit: Payer: Self-pay | Admitting: Internal Medicine

## 2020-04-05 DIAGNOSIS — E6609 Other obesity due to excess calories: Secondary | ICD-10-CM

## 2020-04-05 MED ORDER — WEGOVY 1.7 MG/0.75ML ~~LOC~~ SOAJ: 1.7000 mg | SUBCUTANEOUS | 0 refills | Status: DC

## 2020-05-06 ENCOUNTER — Other Ambulatory Visit: Payer: Self-pay | Admitting: Internal Medicine

## 2020-05-06 DIAGNOSIS — Z6830 Body mass index (BMI) 30.0-30.9, adult: Secondary | ICD-10-CM

## 2020-05-06 DIAGNOSIS — E66811 Obesity, class 1: Secondary | ICD-10-CM

## 2020-05-06 DIAGNOSIS — E6609 Other obesity due to excess calories: Secondary | ICD-10-CM

## 2020-05-07 ENCOUNTER — Other Ambulatory Visit: Payer: Self-pay | Admitting: Internal Medicine

## 2020-05-07 DIAGNOSIS — E6609 Other obesity due to excess calories: Secondary | ICD-10-CM

## 2020-05-07 DIAGNOSIS — Z6830 Body mass index (BMI) 30.0-30.9, adult: Secondary | ICD-10-CM

## 2020-05-07 MED ORDER — WEGOVY 2.4 MG/0.75ML ~~LOC~~ SOAJ: 2.4000 mg | SUBCUTANEOUS | 1 refills | Status: DC

## 2020-05-19 ENCOUNTER — Encounter: Payer: Self-pay | Admitting: Internal Medicine

## 2020-05-19 NOTE — Telephone Encounter (Signed)
Key: [id number]

## 2020-05-20 NOTE — Telephone Encounter (Signed)
PA was denied.

## 2020-05-27 ENCOUNTER — Other Ambulatory Visit: Payer: Self-pay | Admitting: Internal Medicine

## 2020-05-27 DIAGNOSIS — F329 Major depressive disorder, single episode, unspecified: Secondary | ICD-10-CM

## 2020-05-27 MED ORDER — VORTIOXETINE HBR 5 MG PO TABS
5.0000 mg | ORAL_TABLET | Freq: Every day | ORAL | 0 refills | Status: DC
Start: 2020-05-27 — End: 2020-06-23

## 2020-06-20 ENCOUNTER — Other Ambulatory Visit: Payer: Self-pay | Admitting: Internal Medicine

## 2020-06-20 DIAGNOSIS — F329 Major depressive disorder, single episode, unspecified: Secondary | ICD-10-CM

## 2020-06-23 ENCOUNTER — Other Ambulatory Visit: Payer: Self-pay | Admitting: Internal Medicine

## 2020-06-23 DIAGNOSIS — F329 Major depressive disorder, single episode, unspecified: Secondary | ICD-10-CM

## 2020-06-23 MED ORDER — VIIBRYD 40 MG PO TABS: 40.0000 mg | ORAL_TABLET | Freq: Every day | ORAL | 1 refills | Status: DC

## 2020-07-03 ENCOUNTER — Telehealth: Payer: Self-pay | Admitting: Internal Medicine

## 2020-07-03 NOTE — Telephone Encounter (Signed)
The patients Husband Breklyn Fabrizio is your patient).  He would like to know if you would be willing to see the patient.  He states his wife  has been experiencing diarrhea, reflux and belching for one week. He would much appreciate a call back regarding if his PCP would be willing to see her for this problem. He was informed his PCP will by out of the office until Monday 07/06/20.

## 2020-07-05 NOTE — Telephone Encounter (Signed)
I am willing to take her on as a patient but because I have been away there is not space to see her this week. Set her up with a new patient appointment in the future if she is still interested and refer her to the new [facility] on [address].

## 2020-07-06 NOTE — Telephone Encounter (Signed)
LVM to call back to go over below

## 2020-07-07 ENCOUNTER — Other Ambulatory Visit: Payer: Self-pay

## 2020-07-07 ENCOUNTER — Encounter: Payer: Self-pay | Admitting: Internal Medicine

## 2020-07-07 ENCOUNTER — Ambulatory Visit: Payer: BC Managed Care – PPO | Admitting: Internal Medicine

## 2020-07-07 VITALS — BP 126/76 | HR 60 | Temp 98.1°F | Ht 67.0 in | Wt 167.6 lb

## 2020-07-07 DIAGNOSIS — R197 Diarrhea, unspecified: Secondary | ICD-10-CM

## 2020-07-07 NOTE — Patient Instructions (Signed)
   Since your symptoms are improving lets not do anything.   Take the antinausea as needed.   Stay hydrated, bland diet.   If your symptoms recur let us know.

## 2020-07-07 NOTE — Progress Notes (Signed)
Subjective:    Patient ID: Jennifer Mcknight, female    DOB: [birth year], 50 y.o.   MRN: [id number]  HPI The patient is here for follow up from the ED.    Yesterday she went to the [facility] ED for nausea, abdominal pain and diarrhea.  Her symptoms started about 9 days ago.  She was experiencing nausea and diarrhea, but her symptoms only occurred at night.  She also had increased fatigue.  During the day she did okay.  She was tired because she was not sleeping much.  She would have 5 episodes of diarrhea at night on average.  Yesterday while at work she had some epigastric abdominal pain, which is why she went to the emergency room.  She was taking Imodium and it helped initially, but after a while it did not help.  She denies any new medications, different diet or new foods, different supplements, travel or sick contacts.  Receiving the IV fluids in the emergency room and the antinausea medication made her feel much better.  Today she feels much better-not 100%, but much better.  She did not have any diarrhea last night.  She did take the Reglan this morning.  She ate more today than she has been eating and has not had any issues.   Reviewed ED note. Blood work yesterday wnl, except minimally elevated lipase.    Medications and allergies reviewed with patient and updated if appropriate.  Patient Active Problem List   Diagnosis Date Noted  . Encounter for general adult medical examination with abnormal findings 02/17/2020  . Class 1 obesity due to excess calories without serious comorbidity with body mass index (BMI) of 30.0 to 30.9 in adult 10/08/2019  . Leukocytosis 10/05/2018  . Essential hypertension 09/18/2018  . Thrombocytosis 12/26/2017  . Elevated LFTs 12/26/2017  . Cervical cancer screening 03/02/2017  . OSA (obstructive sleep apnea) 07/30/2014  . Visit for screening mammogram 07/30/2014  . Major depression, chronic 10/08/2008    Current Outpatient Medications on File Prior to  Visit  Medication Sig Dispense Refill  . FETZIMA 80 MG CP24 TAKE 1 CAPSULE BY MOUTH DAILY 90 capsule 1  . Insulin Pen Needle (NOVOFINE) 32G X 6 MM MISC 1 Act by Does not apply route once a week. 50 each 0  . metoCLOPramide (REGLAN) 10 MG tablet Take 10 mg by mouth 3 (three) times daily.    . Multiple Vitamin (MULTIVITAMIN WITH MINERALS) TABS tablet Take 1 tablet by mouth daily.    . Semaglutide-Weight Management (WEGOVY) 2.4 MG/0.75ML SOAJ Inject 2.4 mg into the skin once a week. 9 mL 1  . VIIBRYD 20 MG TABS Take 1 tablet by mouth daily.    . Vilazodone HCl (VIIBRYD) 40 MG TABS Take 1 tablet (40 mg total) by mouth daily. 90 tablet 1   No current facility-administered medications on file prior to visit.    Past Medical History:  Diagnosis Date  . Depression   . Elevated liver enzymes   . Family history of adverse reaction to anesthesia    mother had PONV   . Headache    rarely  . PONV (postoperative nausea and vomiting)   . Sleep apnea    no CPAP, per patient unable to tolerate    Past Surgical History:  Procedure Laterality Date  . BREAST SURGERY    . CESAREAN SECTION    . CHOLECYSTECTOMY N/A 12/31/2018   Procedure: LAPAROSCOPIC CHOLECYSTECTOMY;  Surgeon: Ralene Ok, MD;  Location: [facility];  Service: General;  Laterality: N/A;  . MASTOPEXY    . NASAL SINUS SURGERY    . TUBAL LIGATION      Social History   Socioeconomic History  . Marital status: Married    Spouse name: Not on file  . Number of children: Not on file  . Years of education: Not on file  . Highest education level: Not on file  Occupational History  . Not on file  Tobacco Use  . Smoking status: Former Research scientist (life sciences)  . Smokeless tobacco: Never Used  Vaping Use  . Vaping Use: Never used  Substance and Sexual Activity  . Alcohol use: Yes    Comment: rarely  . Drug use: No  . Sexual activity: Yes    Partners: Male    Birth control/protection: Surgical  Other Topics Concern  . Not on file  Social  History Narrative  . Not on file   Social Determinants of Health   Financial Resource Strain: Not on file  Food Insecurity: Not on file  Transportation Needs: Not on file  Physical Activity: Not on file  Stress: Not on file  Social Connections: Not on file    Family History  Problem Relation Age of Onset  . Diabetes Father   . Depression Father   . Alcohol abuse Other   . Drug abuse Other   . Depression Other   . Cancer Neg Hx   . Heart disease Neg Hx   . Early death Neg Hx   . Hyperlipidemia Neg Hx   . Hypertension Neg Hx   . Kidney disease Neg Hx   . Learning disabilities Neg Hx   . Stroke Neg Hx     Review of Systems  Constitutional: Negative for chills and fever.  Respiratory: Negative for shortness of breath.   Cardiovascular: Negative for chest pain.  Gastrointestinal: Positive for abdominal pain, diarrhea and nausea. Negative for blood in stool and vomiting.       No gerd  Neurological: Negative for light-headedness and headaches.       Objective:   Vitals:   07/07/20 1551  BP: 126/76  Pulse: 60  Temp: 98.1 F (36.7 C)  SpO2: 98%   BP Readings from Last 3 Encounters:  07/07/20 126/76  02/17/20 126/86  02/05/19 (!) 146/92   Wt Readings from Last 3 Encounters:  07/07/20 167 lb 9.6 oz (76 kg)  02/17/20 192 lb (87.1 kg)  02/05/19 193 lb 12.8 oz (87.9 kg)   Body mass index is 26.25 kg/m.   Physical Exam Constitutional:      General: She is not in acute distress.    Appearance: Normal appearance. She is not ill-appearing.  HENT:     Head: Normocephalic and atraumatic.  Cardiovascular:     Rate and Rhythm: Normal rate and regular rhythm.  Pulmonary:     Effort: Pulmonary effort is normal. No respiratory distress.     Breath sounds: No wheezing or rales.  Abdominal:     General: There is no distension.     Palpations: Abdomen is soft.     Tenderness: There is no abdominal tenderness. There is no right CVA tenderness, left CVA tenderness,  guarding or rebound.  Musculoskeletal:     Right lower leg: No edema.     Left lower leg: No edema.  Skin:    General: Skin is warm and dry.  Neurological:     Mental Status: She is alert.  Assessment & Plan:    Diarrhea Acute Associated with nausea and 1 day of epigastric pain Started about 9 days ago Went to the ED yesterday and had IV fluids, antinausea medication Blood work essentially normal Symptoms have improved and seems to have resolved At this point since she is doing much better we will not evaluate further Continue Reglan as needed Continue increased fluids, bland diet Advised her to contact us if symptoms recur She agrees with plan     This visit occurred during the SARS-CoV-2 public health emergency.  Safety protocols were in place, including screening questions prior to the visit, additional usage of staff PPE, and extensive cleaning of exam room while observing appropriate contact time as indicated for disinfecting solutions.

## 2020-07-08 ENCOUNTER — Encounter: Payer: Self-pay | Admitting: Internal Medicine

## 2020-07-09 ENCOUNTER — Ambulatory Visit (INDEPENDENT_AMBULATORY_CARE_PROVIDER_SITE_OTHER): Payer: BC Managed Care – PPO

## 2020-07-09 ENCOUNTER — Encounter: Payer: Self-pay | Admitting: Internal Medicine

## 2020-07-09 ENCOUNTER — Ambulatory Visit: Payer: BC Managed Care – PPO | Admitting: Internal Medicine

## 2020-07-09 ENCOUNTER — Other Ambulatory Visit: Payer: Self-pay

## 2020-07-09 VITALS — BP 132/86 | HR 89 | Temp 97.7°F | Resp 16 | Ht 67.0 in | Wt 166.0 lb

## 2020-07-09 DIAGNOSIS — A46 Erysipelas: Secondary | ICD-10-CM

## 2020-07-09 DIAGNOSIS — R220 Localized swelling, mass and lump, head: Secondary | ICD-10-CM

## 2020-07-09 LAB — CBC WITH DIFFERENTIAL/PLATELET
Basophils Absolute: 0.1 10*3/uL (ref 0.0–0.1)
Basophils Relative: 0.7 % (ref 0.0–3.0)
Eosinophils Absolute: 0.4 10*3/uL (ref 0.0–0.7)
Eosinophils Relative: 4.5 % (ref 0.0–5.0)
HCT: 41.3 % (ref 36.0–46.0)
Hemoglobin: 13.9 g/dL (ref 12.0–15.0)
Lymphocytes Relative: 33.8 % (ref 12.0–46.0)
Lymphs Abs: 3.3 10*3/uL (ref 0.7–4.0)
MCHC: 33.8 g/dL (ref 30.0–36.0)
MCV: 86.1 fl (ref 78.0–100.0)
Monocytes Absolute: 0.7 10*3/uL (ref 0.1–1.0)
Monocytes Relative: 6.9 % (ref 3.0–12.0)
Neutro Abs: 5.3 10*3/uL (ref 1.4–7.7)
Neutrophils Relative %: 54.1 % (ref 43.0–77.0)
Platelets: 488 10*3/uL — ABNORMAL HIGH (ref 150.0–400.0)
RBC: 4.8 Mil/uL (ref 3.87–5.11)
RDW: 12.9 % (ref 11.5–15.5)
WBC: 9.9 10*3/uL (ref 4.0–10.5)

## 2020-07-09 LAB — SEDIMENTATION RATE: Sed Rate: 30 mm/hr — ABNORMAL HIGH (ref 0–20)

## 2020-07-09 MED ORDER — AMOXICILLIN-POT CLAVULANATE 875-125 MG PO TABS
1.0000 | ORAL_TABLET | Freq: Two times a day (BID) | ORAL | 0 refills | Status: AC
Start: 2020-07-09 — End: 2020-07-19

## 2020-07-09 NOTE — Progress Notes (Signed)
Subjective:  Patient ID: York Ram, female    DOB: [birth year]  Age: 50 y.o. MRN: [id number]  CC: Facial Swelling  This visit occurred during the SARS-CoV-2 public health emergency.  Safety protocols were in place, including screening questions prior to the visit, additional usage of staff PPE, and extensive cleaning of exam room while observing appropriate contact time as indicated for disinfecting solutions.    HPI Mary Secord presents for f/up - she complains of a 2 day hx of pain/swelling over her right lower face.  Outpatient Medications Prior to Visit  Medication Sig Dispense Refill  . FETZIMA 80 MG CP24 TAKE 1 CAPSULE BY MOUTH DAILY 90 capsule 1  . Insulin Pen Needle (NOVOFINE) 32G X 6 MM MISC 1 Act by Does not apply route once a week. 50 each 0  . metoCLOPramide (REGLAN) 10 MG tablet Take 10 mg by mouth 3 (three) times daily.    . Multiple Vitamin (MULTIVITAMIN WITH MINERALS) TABS tablet Take 1 tablet by mouth daily.    . Semaglutide-Weight Management (WEGOVY) 2.4 MG/0.75ML SOAJ Inject 2.4 mg into the skin once a week. 9 mL 1  . VIIBRYD 20 MG TABS Take 1 tablet by mouth daily.    . Vilazodone HCl (VIIBRYD) 40 MG TABS Take 1 tablet (40 mg total) by mouth daily. 90 tablet 1   No facility-administered medications prior to visit.    ROS Review of Systems  Constitutional: Negative for chills, diaphoresis, fatigue and fever.  HENT: Positive for facial swelling and rhinorrhea. Negative for dental problem, sinus pressure, sore throat, trouble swallowing and voice change.   Eyes: Negative.   Respiratory: Negative for cough and shortness of breath.   Cardiovascular: Negative for palpitations and leg swelling.  Gastrointestinal: Negative for abdominal pain, diarrhea, nausea and vomiting.  Endocrine: Negative.   Genitourinary: Negative.   Musculoskeletal: Negative.  Negative for myalgias.  Skin: Positive for color change. Negative for rash.  Neurological: Negative for  dizziness, light-headedness and headaches.  Hematological: Negative for adenopathy. Does not bruise/bleed easily.  Psychiatric/Behavioral: Negative.     Objective:  BP 132/86 (BP Location: Left Arm, Patient Position: Sitting, Cuff Size: Large)   Pulse 89   Temp 97.7 F (36.5 C) (Oral)   Resp 16   Ht 5' 7"  (1.702 m)   Wt 166 lb (75.3 kg)   LMP 06/26/2020   SpO2 98%   BMI 26.00 kg/m   BP Readings from Last 3 Encounters:  07/09/20 132/86  07/07/20 126/76  02/17/20 126/86    Wt Readings from Last 3 Encounters:  07/09/20 166 lb (75.3 kg)  07/07/20 167 lb 9.6 oz (76 kg)  02/17/20 192 lb (87.1 kg)    Physical Exam Vitals reviewed.  HENT:     Head:      Mouth/Throat:     Lips: Pink.     Mouth: Mucous membranes are moist.     Dentition: No gingival swelling or dental abscesses.     Tongue: No lesions.     Palate: No mass.     Pharynx: No oropharyngeal exudate or posterior oropharyngeal erythema.     Tonsils: No tonsillar exudate.  Eyes:     General: No scleral icterus.    Conjunctiva/sclera: Conjunctivae normal.  Cardiovascular:     Rate and Rhythm: Normal rate and regular rhythm.     Heart sounds: No murmur heard.   Pulmonary:     Effort: Pulmonary effort is normal.     Breath sounds: No  stridor. No wheezing or rhonchi.  Chest:  Breasts:     Right: No axillary adenopathy or supraclavicular adenopathy.     Left: No axillary adenopathy or supraclavicular adenopathy.    Abdominal:     General: Abdomen is flat. Bowel sounds are normal. There is no distension.     Palpations: Abdomen is soft. There is no hepatomegaly, splenomegaly or mass.     Tenderness: There is no abdominal tenderness.  Musculoskeletal:        General: Normal range of motion.     Cervical back: Neck supple.     Right lower leg: No edema.     Left lower leg: No edema.  Lymphadenopathy:     Head:     Right side of head: No submental, submandibular, tonsillar, preauricular, posterior  auricular or occipital adenopathy.     Left side of head: No submental, submandibular, tonsillar, preauricular, posterior auricular or occipital adenopathy.     Cervical: No cervical adenopathy.     Right cervical: No superficial, deep or posterior cervical adenopathy.    Left cervical: No superficial, deep or posterior cervical adenopathy.     Upper Body:     Right upper body: No supraclavicular or axillary adenopathy.     Left upper body: No supraclavicular or axillary adenopathy.  Skin:    General: Skin is warm and dry.  Neurological:     General: No focal deficit present.     Mental Status: She is alert.     Cranial Nerves: Cranial nerves are intact. No cranial nerve deficit, dysarthria or facial asymmetry.     Sensory: Sensation is intact.  Psychiatric:        Mood and Affect: Mood normal.        Behavior: Behavior normal.     Lab Results  Component Value Date   WBC 9.9 07/09/2020   HGB 13.9 07/09/2020   HCT 41.3 07/09/2020   PLT 488.0 (H) 07/09/2020   GLUCOSE 102 (H) 02/17/2020   CHOL 229 (H) 02/17/2020   TRIG 154.0 (H) 02/17/2020   HDL 52.80 02/17/2020   LDLDIRECT 123.1 03/21/2013   LDLCALC 145 (H) 02/17/2020   ALT 40 (H) 02/17/2020   AST 33 02/17/2020   NA 138 02/17/2020   K 4.0 02/17/2020   CL 103 02/17/2020   CREATININE 0.77 02/17/2020   BUN 12 02/17/2020   CO2 27 02/17/2020   TSH 2.32 09/18/2018   INR 1.0 02/17/2020    DG Facial Bones Complete  Result Date: 07/09/2020 CLINICAL DATA:  Right-sided facial pain and swelling for the past 2 days. No injury. EXAM: FACIAL BONES COMPLETE 3+V COMPARISON:  None. FINDINGS: There is no evidence of fracture or other significant bone abnormality. No orbital emphysema or sinus air-fluid levels are seen. IMPRESSION: Negative. Electronically Signed   By: Titus Dubin M.D.   On: 07/09/2020 14:52    Assessment & Plan:   Arlethia was seen today for facial swelling.  Diagnoses and all orders for this visit:  Right facial  swelling- She has a 2-day history of right facial pain and swelling.  She is neurologically intact.  Facial bones are unremarkable.  Her white cell count is in the upper limits of the normal range and her sed rate is mildly elevated at 30.  Will treat for erysipelas. -     CBC with Differential/Platelet; Future -     Sedimentation rate; Future -     DG Facial Bones Complete; Future -  Sedimentation rate -     CBC with Differential/Platelet  Erysipelas -     amoxicillin-clavulanate (AUGMENTIN) 875-125 MG tablet; Take 1 tablet by mouth 2 (two) times daily for 10 days.   I am having Yilia Kiel start on amoxicillin-clavulanate. I am also having her maintain her multivitamin with minerals, NovoFine, Wegovy, Fetzima, Viibryd, metoCLOPramide, and Viibryd.  Meds ordered this encounter  Medications  . amoxicillin-clavulanate (AUGMENTIN) 875-125 MG tablet    Sig: Take 1 tablet by mouth 2 (two) times daily for 10 days.    Dispense:  20 tablet    Refill:  0     Follow-up: Return in about 3 weeks (around 07/30/2020).  Scarlette Calico, MD

## 2020-07-09 NOTE — Patient Instructions (Signed)
Erysipelas  Erysipelas is an infection that affects the skin and tissues near the surface of the skin. It causes the skin to become red, swollen, and painful. The infection is most common on the legs but may also affect other areas, such as the face. With treatment, the infection usually goes away in a few days. If not treated, the infection can spread or lead to other problems, such as collections of pus (abscesses). What are the causes? This condition is caused by bacteria. Most often, it is caused by bacteria called streptococci.  Bacteria may get into the skin through a break in the skin, such as:  A cut or scrape.  An incision from surgery.  A burn.  An insect bite.  An open sore.  A crack in the skin. Sometimes, it is not known how the bacteria infected the skin. What increases the risk? You are more likely to develop this condition if you:  Are a young child.  Are an older adult.  Have a weakened disease-fighting system (immune system), such as having HIV or AIDS.  Have diabetes.  Drink a lot of alcohol.  Had recent surgery.  Have a yeast infection of the skin.  Have swollen legs. What are the signs or symptoms? The infection causes a reddened area on the skin. This reddened area may:  Be painful and swollen.  Have a clear border around it.  Feel itchy and hot.  Develop blisters or abscesses. Other symptoms may include:  Fever.  Chills.  Nausea and vomiting.  Swollen glands (lymph nodes), such as in the neck.  Headache.  Feeling tired (fatigue).  Loss of appetite. How is this diagnosed? This condition is diagnosed based on:  A physical exam. Your health care provider will examine your skin closely.  Your symptoms and medical history. How is this treated? This condition is treated with antibiotic medicine. Symptoms usually get better within a few days after starting antibiotics. Follow these instructions at home: Medicines  Take other  over-the-counter and prescription medicines only as told by your health care provider.  Take your antibiotic medicine as told by your health care provider. Do not stop taking the antibiotic even if your condition starts to improve.  Ask your health care provider if it is safe for you to take medicines for pain as needed, such as acetaminophen or ibuprofen. General instructions  If the affected area is on an arm or leg, raise (elevate) the affected arm or leg above the level of your heart while you are sitting or lying down.  Do not put any creams or lotions on the affected area of your skin unless your health care provider tells you to do that.  Do not share bedding, towels, or washcloths (linens) with other people. Use only your own linens to prevent the infection from spreading to others.  Follow instructions from your health care provider about how to take care of your wound. Make sure you: ? Wash your hands with soap and water for at least 20 seconds before and after you change your bandage (dressing). If soap and water are not available, use hand sanitizer. ? Change your dressing as told by your health care provider.  Keep all follow-up visits. This is important.   Contact a health care provider if:  Your symptoms do not improve within 1-2 days of starting treatment.  You develop new symptoms.  You have a fever.  You feel generally sick (malaise) with muscle aches and pains. Get help  right away if:  Your symptoms get worse.  You develop vomiting or diarrhea that does not go away.  Your red area gets larger or turns dark in color.  You notice red streaks coming from the infected area. Summary  Erysipelas is an infection affecting the skin and tissues near the surface of the skin. It causes the skin to become red, swollen, and painful.  This condition is caused by bacteria. Most often, it is caused by bacteria called streptococci.  Bacteria may enter through a break in  the skin. Sometimes, it is not known how the bacteria infected the skin.  This condition is treated with antibiotic medicine. Symptoms usually get better within a few days after starting antibiotics. This information is not intended to replace advice given to you by your health care provider. Make sure you discuss any questions you have with your health care provider. Document Revised: 07/11/2019 Document Reviewed: 07/10/2019 Elsevier Patient Education  [place].

## 2020-07-09 NOTE — Telephone Encounter (Signed)
Pt has been scheduled for today with PCP

## 2020-07-14 ENCOUNTER — Encounter: Payer: Self-pay | Admitting: Internal Medicine

## 2020-07-14 ENCOUNTER — Other Ambulatory Visit: Payer: Self-pay | Admitting: Internal Medicine

## 2020-07-14 DIAGNOSIS — R220 Localized swelling, mass and lump, head: Secondary | ICD-10-CM

## 2020-07-14 NOTE — Telephone Encounter (Signed)
Pt is scheduled for Sept 19, 2022

## 2020-07-16 ENCOUNTER — Other Ambulatory Visit: Payer: Self-pay | Admitting: Internal Medicine

## 2020-07-16 ENCOUNTER — Ambulatory Visit: Payer: BC Managed Care – PPO | Admitting: Internal Medicine

## 2020-07-16 ENCOUNTER — Other Ambulatory Visit: Payer: Self-pay

## 2020-07-16 ENCOUNTER — Ambulatory Visit (INDEPENDENT_AMBULATORY_CARE_PROVIDER_SITE_OTHER): Payer: BC Managed Care – PPO

## 2020-07-16 ENCOUNTER — Encounter: Payer: Self-pay | Admitting: Internal Medicine

## 2020-07-16 ENCOUNTER — Telehealth: Payer: Self-pay | Admitting: Internal Medicine

## 2020-07-16 VITALS — BP 162/102 | HR 78 | Temp 98.4°F | Resp 16 | Ht 67.0 in | Wt 170.0 lb

## 2020-07-16 DIAGNOSIS — I10 Essential (primary) hypertension: Secondary | ICD-10-CM

## 2020-07-16 DIAGNOSIS — R768 Other specified abnormal immunological findings in serum: Secondary | ICD-10-CM

## 2020-07-16 DIAGNOSIS — R059 Cough, unspecified: Secondary | ICD-10-CM | POA: Diagnosis not present

## 2020-07-16 DIAGNOSIS — R7989 Other specified abnormal findings of blood chemistry: Secondary | ICD-10-CM

## 2020-07-16 DIAGNOSIS — R7689 Other specified abnormal immunological findings in serum: Secondary | ICD-10-CM

## 2020-07-16 DIAGNOSIS — R197 Diarrhea, unspecified: Secondary | ICD-10-CM | POA: Diagnosis not present

## 2020-07-16 LAB — CBC WITH DIFFERENTIAL/PLATELET
Basophils Absolute: 0 10*3/uL (ref 0.0–0.1)
Basophils Relative: 0.4 % (ref 0.0–3.0)
Eosinophils Absolute: 0.9 10*3/uL — ABNORMAL HIGH (ref 0.0–0.7)
Eosinophils Relative: 10.3 % — ABNORMAL HIGH (ref 0.0–5.0)
HCT: 39.6 % (ref 36.0–46.0)
Hemoglobin: 13.4 g/dL (ref 12.0–15.0)
Lymphocytes Relative: 37.9 % (ref 12.0–46.0)
Lymphs Abs: 3.3 10*3/uL (ref 0.7–4.0)
MCHC: 33.9 g/dL (ref 30.0–36.0)
MCV: 85.7 fl (ref 78.0–100.0)
Monocytes Absolute: 0.5 10*3/uL (ref 0.1–1.0)
Monocytes Relative: 6.2 % (ref 3.0–12.0)
Neutro Abs: 3.9 10*3/uL (ref 1.4–7.7)
Neutrophils Relative %: 45.2 % (ref 43.0–77.0)
Platelets: 455 10*3/uL — ABNORMAL HIGH (ref 150.0–400.0)
RBC: 4.62 Mil/uL (ref 3.87–5.11)
RDW: 13.1 % (ref 11.5–15.5)
WBC: 8.7 10*3/uL (ref 4.0–10.5)

## 2020-07-16 LAB — HEPATIC FUNCTION PANEL
ALT: 316 U/L — ABNORMAL HIGH (ref 0–35)
AST: 242 U/L — ABNORMAL HIGH (ref 0–37)
Albumin: 3.8 g/dL (ref 3.5–5.2)
Alkaline Phosphatase: 312 U/L — ABNORMAL HIGH (ref 39–117)
Bilirubin, Direct: 0 mg/dL (ref 0.0–0.3)
Total Bilirubin: 0.3 mg/dL (ref 0.2–1.2)
Total Protein: 6.8 g/dL (ref 6.0–8.3)

## 2020-07-16 LAB — SARS-COV-2 IGG: SARS-COV-2 IgG: 5.43

## 2020-07-16 LAB — URINALYSIS, ROUTINE W REFLEX MICROSCOPIC
Bilirubin Urine: NEGATIVE
Ketones, ur: NEGATIVE
Leukocytes,Ua: NEGATIVE
Nitrite: NEGATIVE
Specific Gravity, Urine: 1.01 (ref 1.000–1.030)
Total Protein, Urine: NEGATIVE
Urine Glucose: NEGATIVE
Urobilinogen, UA: 0.2 (ref 0.0–1.0)
pH: 7 (ref 5.0–8.0)

## 2020-07-16 LAB — BASIC METABOLIC PANEL
BUN: 4 mg/dL — ABNORMAL LOW (ref 6–23)
CO2: 28 mEq/L (ref 19–32)
Calcium: 8.9 mg/dL (ref 8.4–10.5)
Chloride: 104 mEq/L (ref 96–112)
Creatinine, Ser: 0.58 mg/dL (ref 0.40–1.20)
GFR: 106.88 mL/min (ref >60.00)
Glucose, Bld: 89 mg/dL (ref 70–99)
Potassium: 3.5 mEq/L (ref 3.5–5.1)
Sodium: 140 mEq/L (ref 135–145)

## 2020-07-16 LAB — CORTISOL: Cortisol, Plasma: 5.8 ug/dL

## 2020-07-16 LAB — TSH: TSH: 1.44 u[IU]/mL (ref 0.35–4.50)

## 2020-07-16 NOTE — Patient Instructions (Signed)

## 2020-07-16 NOTE — Telephone Encounter (Signed)
Patient called and said that she tested positive for covid 19 and she is requesting a work note. Please advise

## 2020-07-16 NOTE — Progress Notes (Signed)
Subjective:  Patient ID: Jennifer Mcknight, female    DOB: [birth year]  Age: 50 y.o. MRN: [id number]  CC: Facial Swelling (Follow up for face swelling, pt states her face swelling has gone down, but she has nasal congestion and a cough.) and Cough  This visit occurred during the SARS-CoV-2 public health emergency.  Safety protocols were in place, including screening questions prior to the visit, additional usage of staff PPE, and extensive cleaning of exam room while observing appropriate contact time as indicated for disinfecting solutions.    HPI Neytiri Asche presents for f/up -  I saw her about a week ago for painful swelling in her right anterior face.  It looked like eryspieloid so I prescribed Augmentin.  She called a few days ago and said the pain and swelling were not getting any better so I ordered an MRI but it has not been done yet.  Over the last 24 hours the pain and swelling has dramatically diminished.  She complains of a 2-week history of diarrhea that got worse when she took Augmentin.  She has about 3 or 4 loose bowel movements a day.  The diarrhea is controlled with Imodium.  She denies abdominal pain, cramping, bright red blood per rectum, melena, nausea, vomiting, or loss of appetite.  Over the last 24 hours she has developed a sore throat, runny nose, and nonproductive cough.  Outpatient Medications Prior to Visit  Medication Sig Dispense Refill  . amoxicillin-clavulanate (AUGMENTIN) 875-125 MG tablet Take 1 tablet by mouth 2 (two) times daily for 10 days. 20 tablet 0  . FETZIMA 80 MG CP24 TAKE 1 CAPSULE BY MOUTH DAILY 90 capsule 1  . Insulin Pen Needle (NOVOFINE) 32G X 6 MM MISC 1 Act by Does not apply route once a week. 50 each 0  . metoCLOPramide (REGLAN) 10 MG tablet Take 10 mg by mouth 3 (three) times daily.    . Multiple Vitamin (MULTIVITAMIN WITH MINERALS) TABS tablet Take 1 tablet by mouth daily.    . Semaglutide-Weight Management (WEGOVY) 2.4 MG/0.75ML SOAJ Inject 2.4 mg  into the skin once a week. 9 mL 1  . VIIBRYD 20 MG TABS Take 1 tablet by mouth daily.    . Vilazodone HCl (VIIBRYD) 40 MG TABS Take 1 tablet (40 mg total) by mouth daily. 90 tablet 1   No facility-administered medications prior to visit.    ROS Review of Systems  Constitutional: Positive for unexpected weight change (wt gain). Negative for appetite change, chills, diaphoresis, fatigue and fever.  HENT: Positive for facial swelling, postnasal drip, rhinorrhea and sore throat. Negative for ear discharge, ear pain and sinus pressure.   Respiratory: Positive for cough. Negative for chest tightness, shortness of breath and wheezing.   Cardiovascular: Negative for chest pain, palpitations and leg swelling.  Gastrointestinal: Positive for diarrhea. Negative for abdominal pain, blood in stool, nausea and vomiting.  Genitourinary: Negative.  Negative for difficulty urinating and hematuria.  Musculoskeletal: Negative.  Negative for arthralgias and myalgias.  Skin: Negative.  Negative for color change and rash.  Neurological: Negative for dizziness, weakness, light-headedness and headaches.  Hematological: Negative for adenopathy. Does not bruise/bleed easily.  Psychiatric/Behavioral: Negative.     Objective:  BP (!) 162/102 (BP Location: Right Arm, Patient Position: Sitting, Cuff Size: Large) Comment: BP (R) 162/102 (L) 158/108  Pulse 78   Temp 98.4 F (36.9 C) (Oral)   Resp 16   Ht 5' 7"  (1.702 m)   Wt 170 lb (77.1 kg)  LMP 06/26/2020   SpO2 99%   BMI 26.63 kg/m   BP Readings from Last 3 Encounters:  07/16/20 (!) 162/102  07/09/20 132/86  07/07/20 126/76    Wt Readings from Last 3 Encounters:  07/16/20 170 lb (77.1 kg)  07/09/20 166 lb (75.3 kg)  07/07/20 167 lb 9.6 oz (76 kg)    Physical Exam Vitals reviewed.  Constitutional:      General: She is not in acute distress.    Appearance: Normal appearance. She is not ill-appearing, toxic-appearing or diaphoretic.  HENT:      Head: No right periorbital erythema or left periorbital erythema.     Jaw: No trismus or tenderness.     Salivary Glands: Right salivary gland is not diffusely enlarged or tender. Left salivary gland is not diffusely enlarged or tender.      Nose: Rhinorrhea present. Rhinorrhea is clear.     Right Sinus: No maxillary sinus tenderness or frontal sinus tenderness.     Left Sinus: No maxillary sinus tenderness or frontal sinus tenderness.     Mouth/Throat:     Lips: Pink. No lesions.     Mouth: Mucous membranes are moist.     Pharynx: Posterior oropharyngeal erythema present. No pharyngeal swelling.     Tonsils: No tonsillar exudate.  Eyes:     General: No scleral icterus.    Conjunctiva/sclera: Conjunctivae normal.  Cardiovascular:     Rate and Rhythm: Normal rate and regular rhythm.     Heart sounds: No murmur heard.   Pulmonary:     Effort: Pulmonary effort is normal.     Breath sounds: No stridor. No wheezing, rhonchi or rales.  Abdominal:     General: Abdomen is flat.     Palpations: There is no mass.     Tenderness: There is no abdominal tenderness. There is no guarding.     Hernia: No hernia is present.  Musculoskeletal:        General: Normal range of motion.     Cervical back: Neck supple.     Right lower leg: No edema.     Left lower leg: No edema.  Lymphadenopathy:     Cervical: No cervical adenopathy.  Skin:    General: Skin is warm and dry.     Coloration: Skin is not pale.  Neurological:     General: No focal deficit present.     Mental Status: She is alert.  Psychiatric:        Mood and Affect: Mood normal.        Behavior: Behavior normal.     Lab Results  Component Value Date   WBC 8.7 07/16/2020   HGB 13.4 07/16/2020   HCT 39.6 07/16/2020   PLT 455.0 (H) 07/16/2020   GLUCOSE 89 07/16/2020   CHOL 229 (H) 02/17/2020   TRIG 154.0 (H) 02/17/2020   HDL 52.80 02/17/2020   LDLDIRECT 123.1 03/21/2013   LDLCALC 145 (H) 02/17/2020   ALT 316 (H)  07/16/2020   AST 242 (H) 07/16/2020   NA 140 07/16/2020   K 3.5 07/16/2020   CL 104 07/16/2020   CREATININE 0.58 07/16/2020   BUN 4 (L) 07/16/2020   CO2 28 07/16/2020   TSH 1.44 07/16/2020   INR 1.0 02/17/2020    DG Chest 2 View  Result Date: 07/16/2020 CLINICAL DATA:  Cough.  History of pneumonia. EXAM: CHEST - 2 VIEW COMPARISON:  11/06/2018. FINDINGS: The heart size and mediastinal contours are within normal limits. Both lungs  are clear. No visible pleural effusions or pneumothorax. No acute osseous abnormality. IMPRESSION: No active cardiopulmonary disease. Electronically Signed   By: Margaretha Sheffield MD   On: 07/16/2020 10:20    Assessment & Plan:   Tephanie was seen today for facial swelling and cough.  Diagnoses and all orders for this visit:  Cough- Her CXR is negative for mass/infiltrate. Her COVID ab is positive. I await the COVID ag results. -     CBC with Differential/Platelet; Future -     SARS-COV-2 IgG; Future -     DG Chest 2 View; Future -     SARS-COV-2 IgG -     CBC with Differential/Platelet -     Novel Coronavirus, NAA ([facility]); Future  Essential hypertension- She has new onset HTN. Will check labs to screen for secondary causes and end organ damage. Will recheck BP in 2-3 weeks when she is not so ill. -     CBC with Differential/Platelet; Future -     TSH; Future -     Basic metabolic panel; Future -     Aldosterone + renin activity w/ ratio; Future -     Cortisol; Future -     Urinalysis, Routine w reflex microscopic; Future -     Urinalysis, Routine w reflex microscopic -     Cortisol -     Aldosterone + renin activity w/ ratio -     Basic metabolic panel -     TSH -     CBC with Differential/Platelet  Elevated LFTs- This is most c/w a recent COVID-19 infection. Will recheck her LFT.s in 2-3 weeks. -     Hepatic function panel; Future -     Hepatic function panel  Diarrhea, unspecified type- C/w COVID infection. Will check stool for WBC's and C  diff. -     TSH; Future -     SARS-COV-2 IgG; Future -     Fecal lactoferrin, quant; Future -     Clostridium difficile EIA; Future -     SARS-COV-2 IgG -     TSH -     Clostridium difficile EIA -     Fecal lactoferrin, quant  COVID-19 virus antibody detected -     Ambulatory referral for Covid Treatment -     Novel Coronavirus, NAA ([facility]); Future   I am having Jennifer Mcknight maintain her multivitamin with minerals, NovoFine, Wegovy, Fetzima, Viibryd, metoCLOPramide, Viibryd, and amoxicillin-clavulanate.  No orders of the defined types were placed in this encounter.    Follow-up: Return in about 4 weeks (around 08/13/2020).  Scarlette Calico, MD

## 2020-07-17 ENCOUNTER — Telehealth: Payer: Self-pay

## 2020-07-17 LAB — NOVEL CORONAVIRUS, NAA: SARS-CoV-2, NAA: DETECTED — AB

## 2020-07-17 LAB — SARS-COV-2, NAA 2 DAY TAT

## 2020-07-17 LAB — SPECIMEN STATUS REPORT

## 2020-07-17 NOTE — Telephone Encounter (Signed)
Pt has been informed that work note was completed.   She stated that she was in route to drop off a stool sample and would pick it up from the front desk while she was here.

## 2020-07-17 NOTE — Telephone Encounter (Signed)
Called to discuss with patient about COVID-19 symptoms and the use of one of the available treatments for those with mild to moderate Covid symptoms and at a high risk of hospitalization.  Pt appears to qualify for outpatient treatment due to co-morbid conditions and/or a member of an at-risk group in accordance with the FDA Emergency Use Authorization.    Symptom onset: 07/15/20 Cough Vaccinated: Yes Booster? Yes Immunocompromised? No Qualifiers: HTN, Obesity NIH Criteria: Tier 4  Declines any further treatment.   Jennifer Mcknight

## 2020-07-19 LAB — CLOSTRIDIUM DIFFICILE EIA: C difficile Toxins A+B, EIA: NEGATIVE

## 2020-07-21 LAB — FECAL LACTOFERRIN, QUANT: LACTOFERRIN, QL, STOOL: NEGATIVE

## 2020-07-27 LAB — ALDOSTERONE + RENIN ACTIVITY W/ RATIO
ALDO / PRA Ratio: 18.8 Ratio (ref 0.9–28.9)
Aldosterone: 3 ng/dL
Renin Activity: 0.16 ng/mL/h — ABNORMAL LOW (ref 0.25–5.82)

## 2020-08-05 ENCOUNTER — Encounter: Payer: Self-pay | Admitting: Internal Medicine

## 2020-10-24 IMAGING — DX DG CHEST 2V
2 series · 2 of 2 positions shown · non-contrast
Comparison: None.

CLINICAL DATA: Leukocytosis, history of tobacco use

EXAM:
CHEST - 2 VIEW

[chest pa]
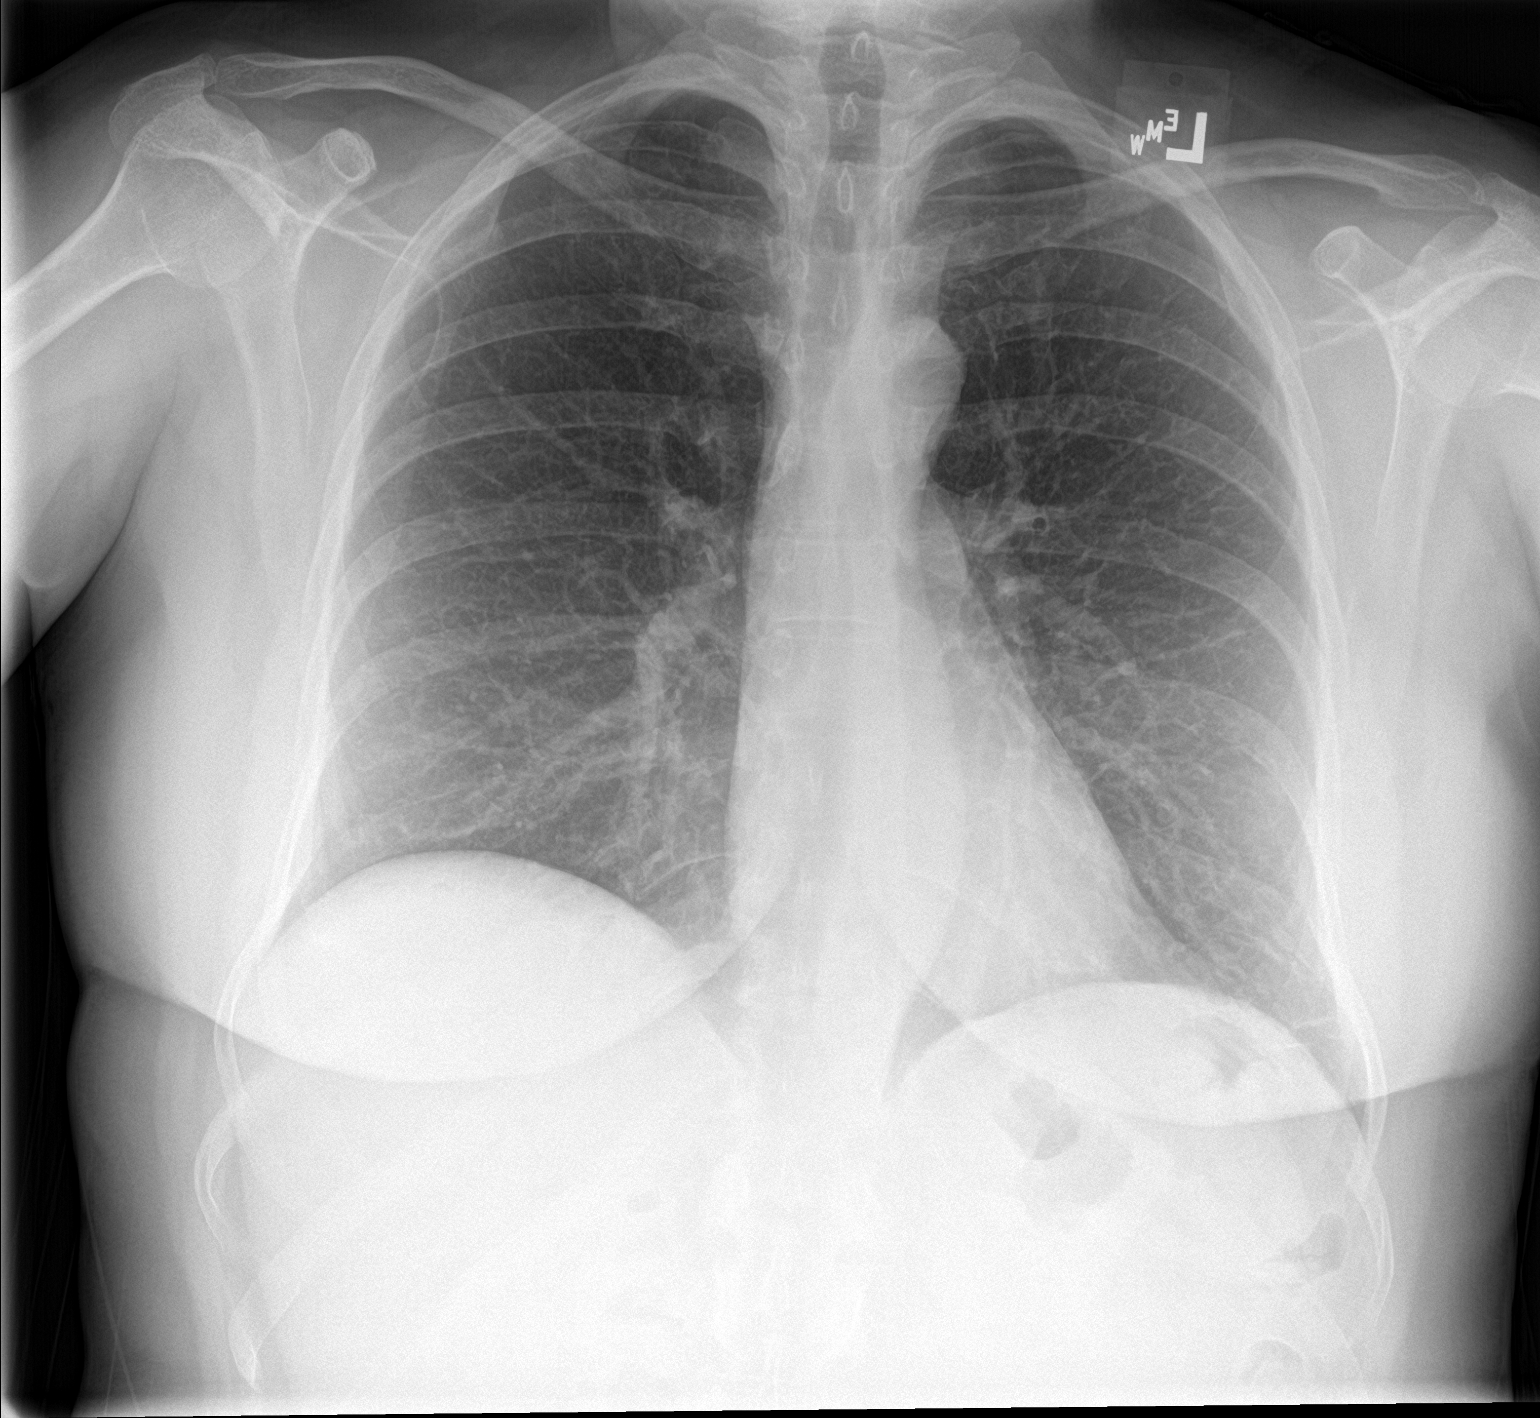

[chest lat]
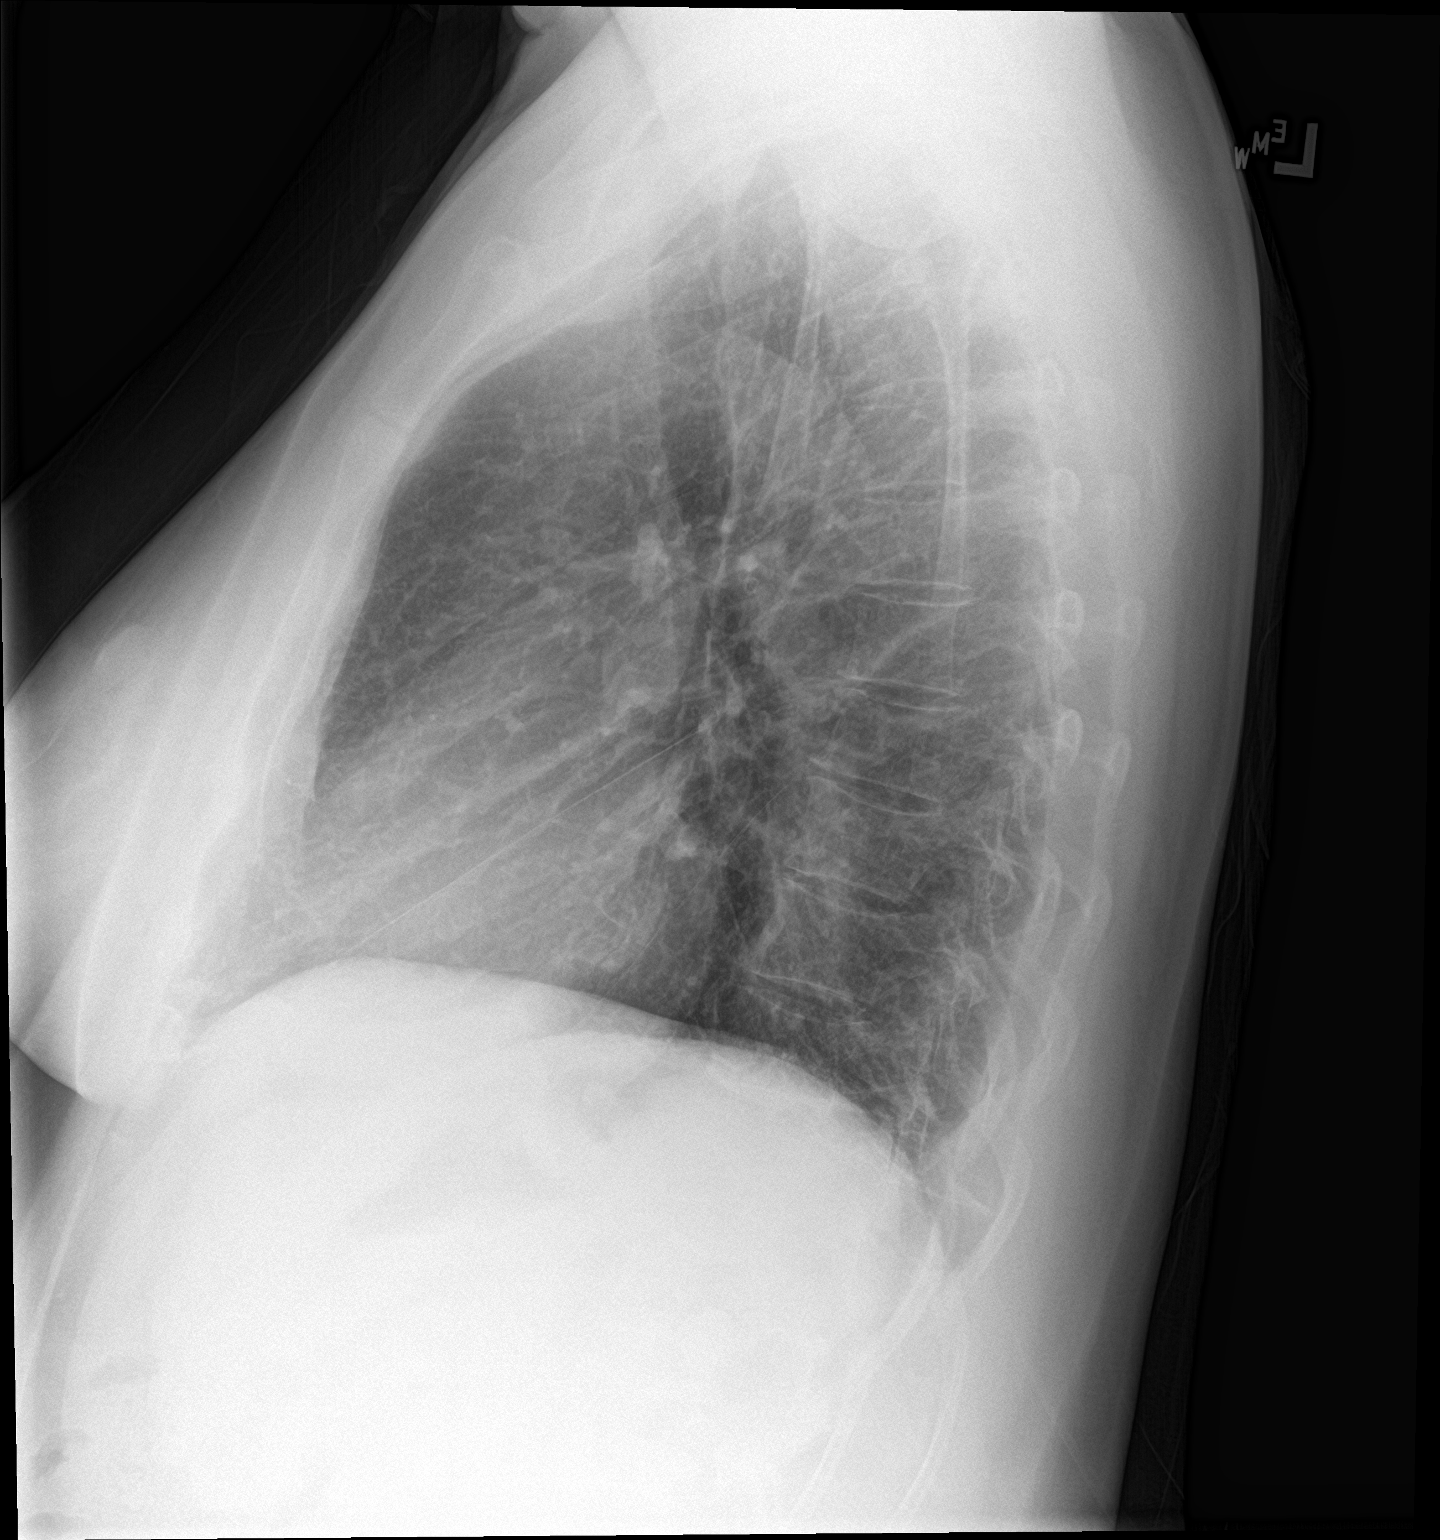

[2 of 2 positions shown; findings below may reference images not displayed]

FINDINGS: The heart size and mediastinal contours are within normal limits.
Both lungs are clear. The visualized skeletal structures are
unremarkable.
IMPRESSION: No active cardiopulmonary disease.

## 2020-10-28 ENCOUNTER — Other Ambulatory Visit: Payer: Self-pay | Admitting: Internal Medicine

## 2020-10-28 DIAGNOSIS — E6609 Other obesity due to excess calories: Secondary | ICD-10-CM

## 2020-11-01 IMAGING — US US ABDOMEN COMPLETE
1 series · 13 of 25 positions shown · non-contrast
Comparison: Abdominal ultrasound 01/10/2018

CLINICAL DATA: Elevated liver function studies.

EXAM:
ABDOMEN ULTRASOUND COMPLETE

[Series 1: us abdomen complete · 13 of 124 slices shown]
[im 1/124]
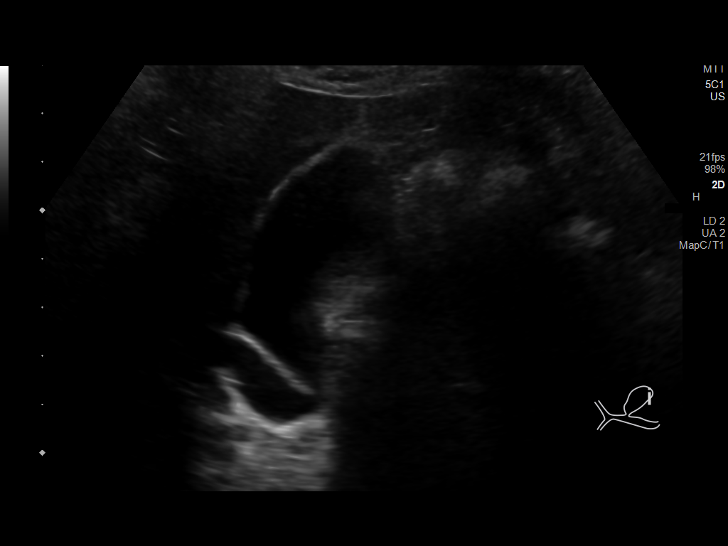
[im 11/124]
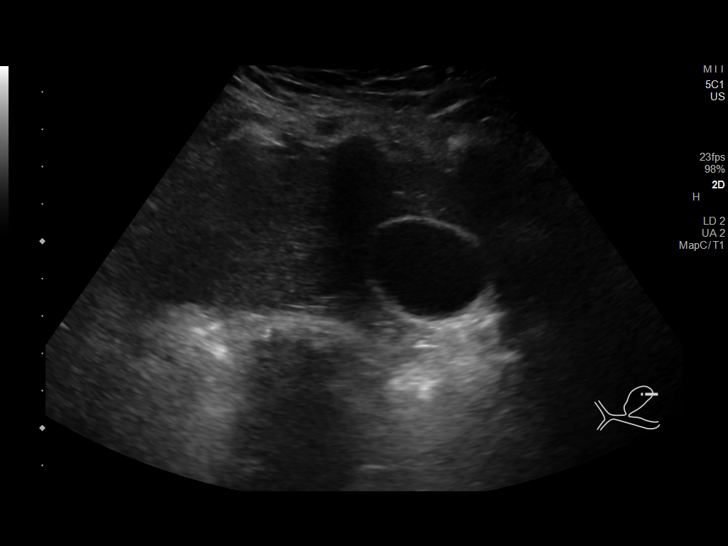
[im 21/124]
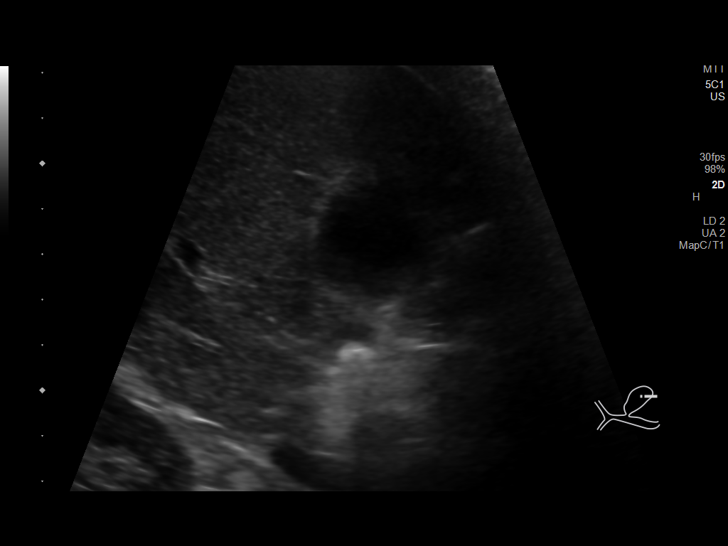
[im 31/124]
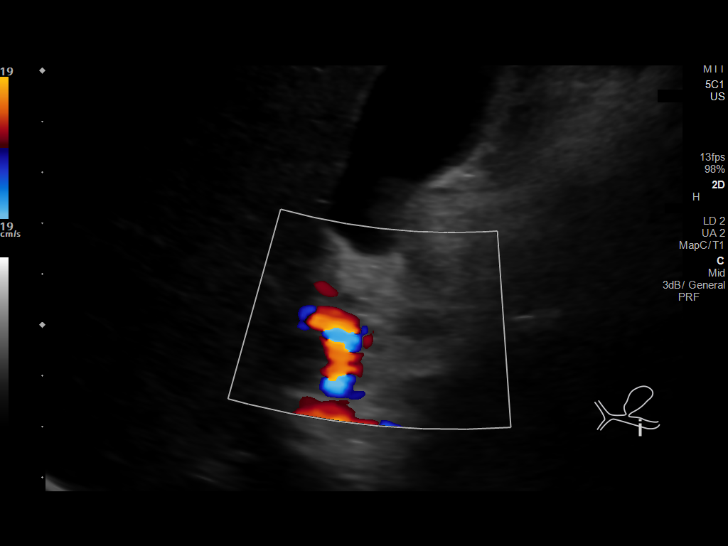
[im 42/124]
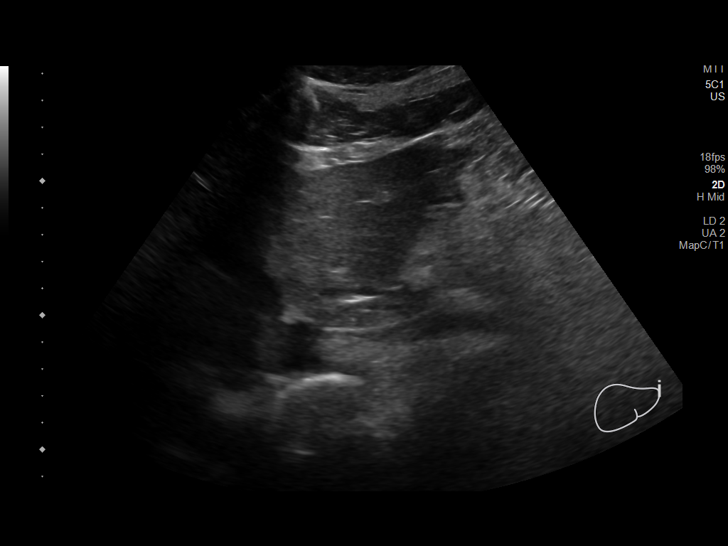
[im 52/124]
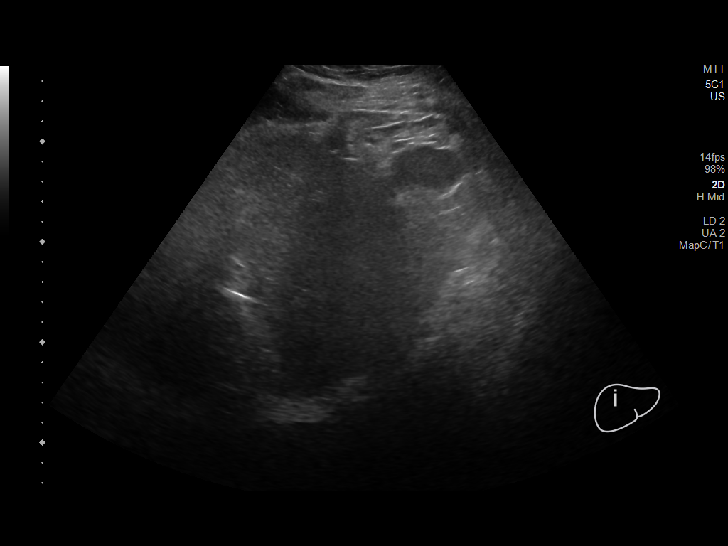
[im 62/124]
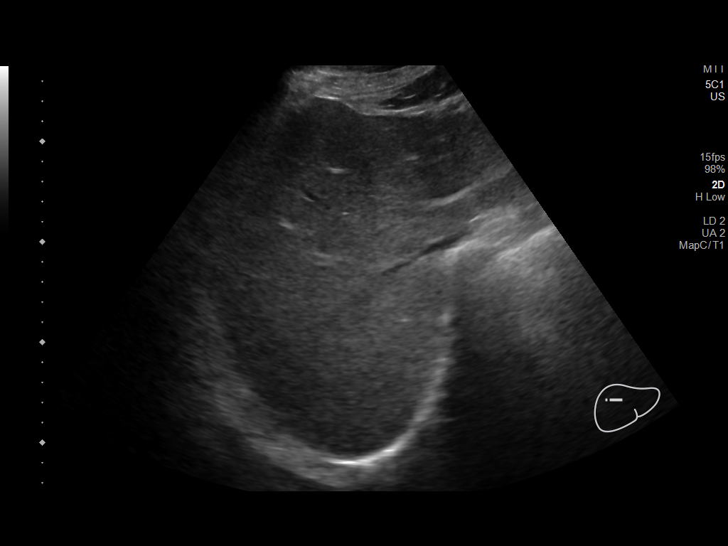
[im 72/124]
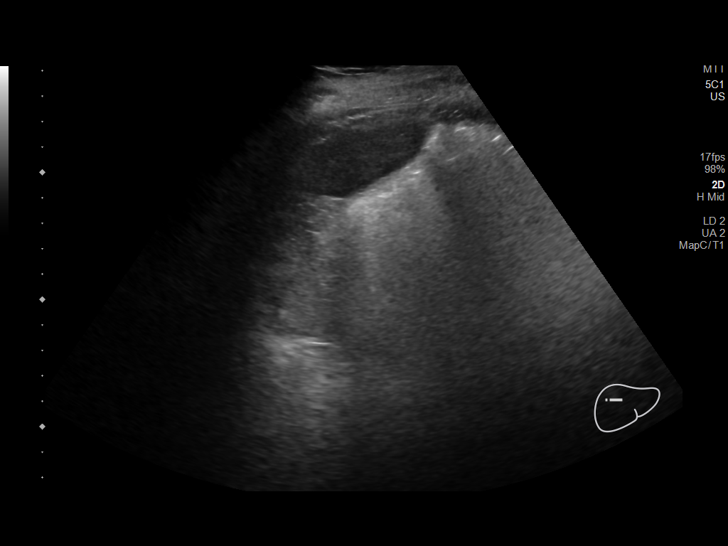
[im 83/124]
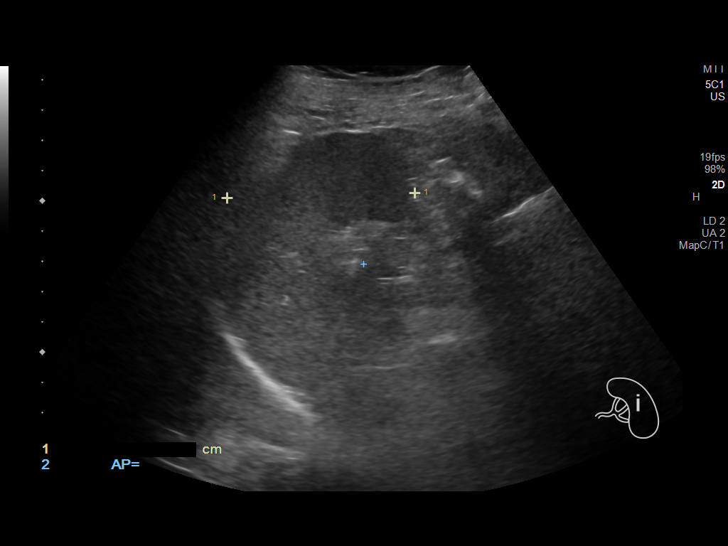
[im 93/124]
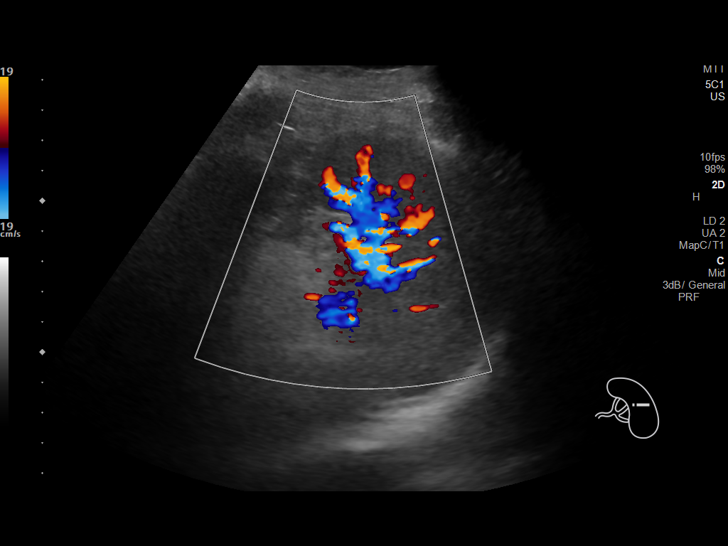
[im 103/124]
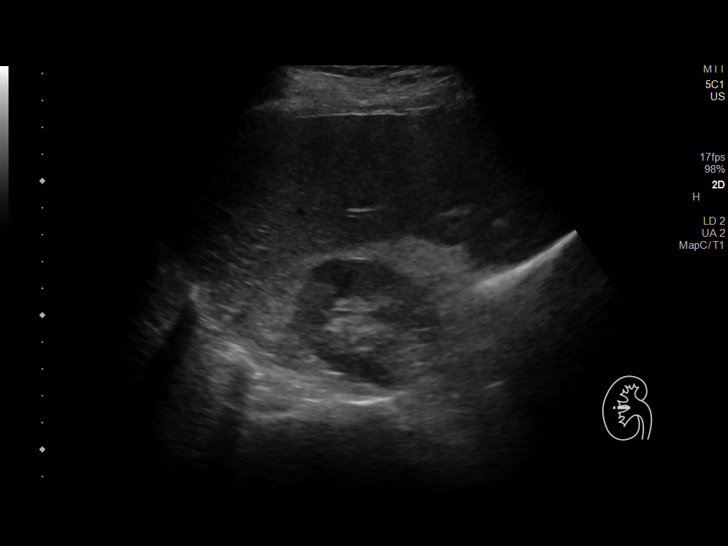
[im 113/124]
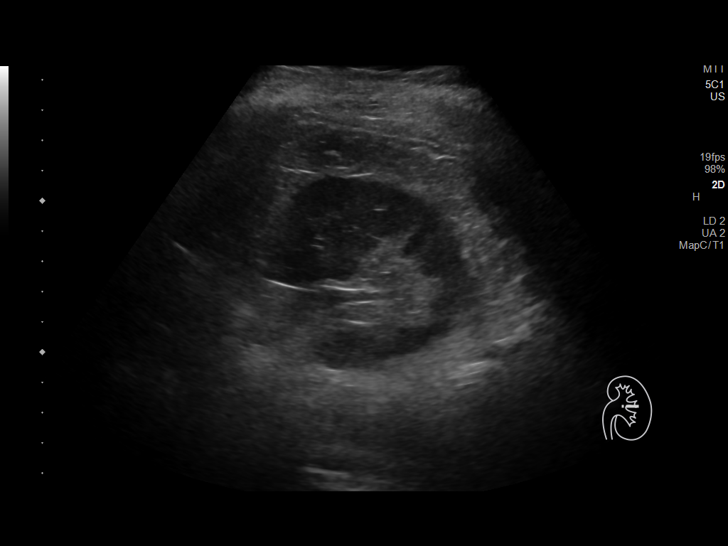
[im 124/124]
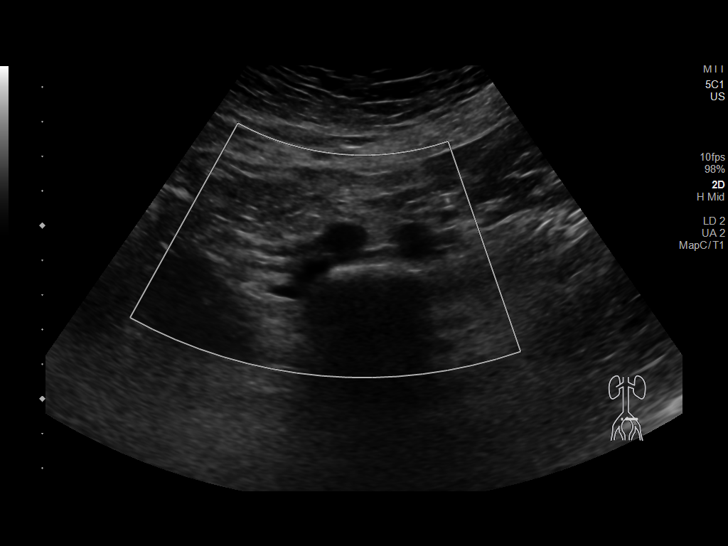

[13 of 25 positions shown; findings below may reference images not displayed]

FINDINGS: Gallbladder: 8 mm calculus noted. There are also areas of ring down
artifact suggesting adenomyomatosis. No gallbladder wall thickening,
pericholecystic fluid or sonographic Murphy sign to suggest acute
cholecystitis.

Common bile duct: Diameter: 2.9 mm

Liver: Normal echogenicity without focal lesion or biliary
dilatation. Portal vein is patent on color Doppler imaging with
normal direction of blood flow towards the liver.

IVC: Normal caliber

Pancreas: Sonographically unremarkable.

Spleen: Normal size.  No focal lesions.

Right Kidney: Length: 10.7 cm. Normal renal cortical thickness and
echogenicity without focal lesions or hydronephrosis.

Left Kidney: Length: 10.9 cm. Normal renal cortical thickness and
echogenicity without focal lesions or hydronephrosis.

Abdominal aorta: Normal caliber

Other findings: None.
IMPRESSION: 1. Cholelithiasis and probable adenomyomatosis. No findings for
acute cholecystitis.
2. No intra or extrahepatic biliary dilatation.
3. Unremarkable sonographic appearance of the liver, spleen,
pancreas and both kidneys.

## 2020-11-09 ENCOUNTER — Encounter: Payer: Self-pay | Admitting: Internal Medicine

## 2020-11-09 ENCOUNTER — Ambulatory Visit: Payer: BC Managed Care – PPO | Admitting: Family Medicine

## 2020-11-10 ENCOUNTER — Other Ambulatory Visit: Payer: Self-pay | Admitting: Internal Medicine

## 2020-11-10 DIAGNOSIS — E6609 Other obesity due to excess calories: Secondary | ICD-10-CM

## 2020-11-10 DIAGNOSIS — Z6830 Body mass index (BMI) 30.0-30.9, adult: Secondary | ICD-10-CM

## 2020-11-10 MED ORDER — INSULIN PEN NEEDLE 32G X 6 MM MISC: 1.0000 | Freq: Every day | 0 refills | Status: DC

## 2020-11-10 MED ORDER — SAXENDA 18 MG/3ML ~~LOC~~ SOPN: 3.0000 mg | PEN_INJECTOR | Freq: Every day | SUBCUTANEOUS | 0 refills | Status: DC

## 2020-12-01 ENCOUNTER — Telehealth: Payer: Self-pay | Admitting: Internal Medicine

## 2020-12-01 NOTE — Telephone Encounter (Signed)
PA Wegovy 2.4 MG  Key: [id number]

## 2020-12-02 NOTE — Telephone Encounter (Signed)
Attempted to initiate PA  Insurance listed in Epic is not active. Pt not covered.

## 2020-12-04 NOTE — Telephone Encounter (Signed)
Contacted patient again and put in the new insurance information.

## 2020-12-04 NOTE — Telephone Encounter (Signed)
Called pt, LVM, inquiring if insurance has changed and if so to get correct info.

## 2020-12-07 NOTE — Telephone Encounter (Signed)
A copy of the front insurance is scanned into demographics.

## 2020-12-08 ENCOUNTER — Other Ambulatory Visit: Payer: Self-pay | Admitting: Internal Medicine

## 2020-12-08 DIAGNOSIS — F329 Major depressive disorder, single episode, unspecified: Secondary | ICD-10-CM

## 2020-12-09 NOTE — Telephone Encounter (Signed)
Per CoverMyMeds:  PA was denied.   Please advise.

## 2020-12-09 NOTE — Telephone Encounter (Signed)
Key: [id number]

## 2020-12-23 ENCOUNTER — Encounter: Payer: Self-pay | Admitting: Internal Medicine

## 2020-12-30 ENCOUNTER — Other Ambulatory Visit: Payer: Self-pay | Admitting: Internal Medicine

## 2021-01-07 ENCOUNTER — Ambulatory Visit: Payer: BC Managed Care – PPO | Admitting: Internal Medicine

## 2021-03-16 ENCOUNTER — Encounter: Payer: Self-pay | Admitting: Internal Medicine

## 2021-03-17 ENCOUNTER — Other Ambulatory Visit: Payer: Self-pay | Admitting: Internal Medicine

## 2021-03-17 DIAGNOSIS — F329 Major depressive disorder, single episode, unspecified: Secondary | ICD-10-CM

## 2021-03-17 MED ORDER — VILAZODONE HCL 40 MG PO TABS: 40.0000 mg | ORAL_TABLET | Freq: Every day | ORAL | 0 refills | Status: DC

## 2021-06-15 ENCOUNTER — Encounter: Payer: Self-pay | Admitting: Internal Medicine

## 2021-06-15 NOTE — Progress Notes (Signed)
? ? ?Subjective:  ? ? Patient ID: Jennifer Mcknight, female    DOB: March 23, 1970, 51 y.o.   MRN: [id number] ? ?This visit occurred during the SARS-CoV-2 public health emergency.  Safety protocols were in place, including screening questions prior to the visit, additional usage of staff PPE, and extensive cleaning of exam room while observing appropriate contact time as indicated for disinfecting solutions. ? ? ? ?HPI ?Jennifer Mcknight is here for  ?Chief Complaint  ?Patient presents with  ? Anxiety  ?  Patient had to stop taking meds due to not being able to afford them  ? ? ?Anxiety, depression-she is still taking the Viibryd, but was not able to afford Fetzima and had to stop taking it.  She has felt increased anxiety and depression.  She denies any suicidal thoughts or panic attacks.  She is having difficulty sleeping and has gained weight because of the depression and not being able to afford the weight loss medication.  She knows she needs to do something, but cannot afford the [place]. ? ? ? ? ?Medications and allergies reviewed with patient and updated if appropriate. ? ?Current Outpatient Medications on File Prior to Visit  ?Medication Sig Dispense Refill  ? Multiple Vitamin (MULTIVITAMIN WITH MINERALS) TABS tablet Take 1 tablet by mouth daily.    ? Vilazodone HCl (VIIBRYD) 40 MG TABS Take 1 tablet (40 mg total) by mouth daily. 90 tablet 0  ? FETZIMA 80 MG CP24 TAKE 1 CAPSULE BY MOUTH DAILY (Patient not taking: Reported on 06/16/2021) 90 capsule 1  ? Insulin Pen Needle 32G X 6 MM MISC 1 Act by Does not apply route daily. (Patient not taking: Reported on 06/16/2021) 100 each 0  ? Liraglutide -Weight Management (SAXENDA) 18 MG/3ML SOPN Inject 3 mg into the skin daily. (Patient not taking: Reported on 06/16/2021) 15 mL 0  ? metoCLOPramide (REGLAN) 10 MG tablet Take 10 mg by mouth 3 (three) times daily. (Patient not taking: Reported on 06/16/2021)    ? WEGOVY 2.4 MG/0.75ML SOAJ SMARTSIG:2.4 Milligram(s) SUB-Q Once a Week (Patient not  taking: Reported on 06/16/2021)    ? ?No current facility-administered medications on file prior to visit.  ? ? ?Review of Systems  ?Constitutional:  Positive for fatigue.  ?Respiratory:  Negative for shortness of breath.   ?Cardiovascular:  Negative for chest pain and palpitations.  ?Psychiatric/Behavioral:  Positive for decreased concentration, dysphoric mood and sleep disturbance. Negative for suicidal ideas. The patient is nervous/anxious.   ? ?   ?Objective:  ? ?Vitals:  ? 06/16/21 1343  ?BP: 140/84  ?Pulse: 80  ?Temp: 98.1 ?F (36.7 ?C)  ?SpO2: 98%  ? ?BP Readings from Last 3 Encounters:  ?06/16/21 140/84  ?07/16/20 (!) 162/102  ?07/09/20 132/86  ? ?Wt Readings from Last 3 Encounters:  ?06/16/21 200 lb 6.4 oz (90.9 kg)  ?07/16/20 170 lb (77.1 kg)  ?07/09/20 166 lb (75.3 kg)  ? ?Body mass index is 31.39 kg/m?. ? ?  ?Physical Exam ?Constitutional:   ?   General: She is not in acute distress. ?   Appearance: Normal appearance.  ?HENT:  ?   Head: Normocephalic.  ?Skin: ?   General: Skin is warm and dry.  ?Neurological:  ?   General: No focal deficit present.  ?   Mental Status: She is alert.  ?Psychiatric:     ?   Behavior: Behavior normal.     ?   Thought Content: Thought content normal.     ?   Judgment:  Judgment normal.  ?   Comments: Mood with slight depression  ? ?   ? ? ? ? ? ?Assessment & Plan:  ? ? ?Major depression, anxiety: ?Chronic ?Symptoms worse since being off Fetzima-unable to afford ?Having increased anxiety and depression.  No thoughts of suicide or self-harm. ?Still taking Viibryd 40 mg daily and we will continue that-she does feel like this has helped ?Discussed options-she has tried several medications in the past ?Start Cymbalta 30 mg daily for 2 weeks and then increase to 60 mg daily ? ? ? ? ?

## 2021-06-16 ENCOUNTER — Ambulatory Visit (INDEPENDENT_AMBULATORY_CARE_PROVIDER_SITE_OTHER): Payer: BC Managed Care – PPO | Admitting: Internal Medicine

## 2021-06-16 VITALS — BP 140/84 | HR 80 | Temp 98.1°F | Ht 67.0 in | Wt 200.4 lb

## 2021-06-16 DIAGNOSIS — F419 Anxiety disorder, unspecified: Secondary | ICD-10-CM | POA: Diagnosis not present

## 2021-06-16 DIAGNOSIS — F329 Major depressive disorder, single episode, unspecified: Secondary | ICD-10-CM | POA: Diagnosis not present

## 2021-06-16 MED ORDER — DULOXETINE HCL 30 MG PO CPEP: ORAL_CAPSULE | ORAL | 3 refills | Status: DC

## 2021-06-16 NOTE — Patient Instructions (Addendum)
? ? ? ? ? ? ?  Medications changes include :   cymbalta 30 mg daily x 2 weeks, then increase to 60 mg daily ? ? ?Your prescription(s) have been sent to your pharmacy.  ? ? ? ?No follow-ups on file. ? ?

## 2021-07-27 ENCOUNTER — Ambulatory Visit (INDEPENDENT_AMBULATORY_CARE_PROVIDER_SITE_OTHER): Payer: BC Managed Care – PPO | Admitting: Internal Medicine

## 2021-07-27 ENCOUNTER — Encounter: Payer: Self-pay | Admitting: Internal Medicine

## 2021-07-27 VITALS — BP 138/86 | HR 83 | Temp 97.7°F | Ht 67.0 in | Wt 206.0 lb

## 2021-07-27 DIAGNOSIS — Z136 Encounter for screening for cardiovascular disorders: Secondary | ICD-10-CM | POA: Diagnosis not present

## 2021-07-27 DIAGNOSIS — Z1211 Encounter for screening for malignant neoplasm of colon: Secondary | ICD-10-CM

## 2021-07-27 DIAGNOSIS — D75839 Thrombocytosis, unspecified: Secondary | ICD-10-CM

## 2021-07-27 DIAGNOSIS — E66811 Obesity, class 1: Secondary | ICD-10-CM | POA: Insufficient documentation

## 2021-07-27 DIAGNOSIS — E6609 Other obesity due to excess calories: Secondary | ICD-10-CM | POA: Diagnosis not present

## 2021-07-27 DIAGNOSIS — Z0001 Encounter for general adult medical examination with abnormal findings: Secondary | ICD-10-CM

## 2021-07-27 DIAGNOSIS — I10 Essential (primary) hypertension: Secondary | ICD-10-CM | POA: Insufficient documentation

## 2021-07-27 DIAGNOSIS — Z0000 Encounter for general adult medical examination without abnormal findings: Secondary | ICD-10-CM

## 2021-07-27 DIAGNOSIS — F331 Major depressive disorder, recurrent, moderate: Secondary | ICD-10-CM | POA: Diagnosis not present

## 2021-07-27 DIAGNOSIS — Z1231 Encounter for screening mammogram for malignant neoplasm of breast: Secondary | ICD-10-CM

## 2021-07-27 DIAGNOSIS — Z6832 Body mass index (BMI) 32.0-32.9, adult: Secondary | ICD-10-CM

## 2021-07-27 DIAGNOSIS — R7989 Other specified abnormal findings of blood chemistry: Secondary | ICD-10-CM

## 2021-07-27 LAB — CBC WITH DIFFERENTIAL/PLATELET
Basophils Absolute: 0.1 10*3/uL (ref 0.0–0.1)
Basophils Relative: 1.1 % (ref 0.0–3.0)
Eosinophils Absolute: 0.5 10*3/uL (ref 0.0–0.7)
Eosinophils Relative: 3.8 % (ref 0.0–5.0)
HCT: 42.1 % (ref 36.0–46.0)
Hemoglobin: 13.7 g/dL (ref 12.0–15.0)
Lymphocytes Relative: 36.6 % (ref 12.0–46.0)
Lymphs Abs: 4.8 10*3/uL — ABNORMAL HIGH (ref 0.7–4.0)
MCHC: 32.7 g/dL (ref 30.0–36.0)
MCV: 83.5 fl (ref 78.0–100.0)
Monocytes Absolute: 0.9 10*3/uL (ref 0.1–1.0)
Monocytes Relative: 6.9 % (ref 3.0–12.0)
Neutro Abs: 6.8 10*3/uL (ref 1.4–7.7)
Neutrophils Relative %: 51.6 % (ref 43.0–77.0)
Platelets: 484 10*3/uL — ABNORMAL HIGH (ref 150.0–400.0)
RBC: 5.04 Mil/uL (ref 3.87–5.11)
RDW: 14 % (ref 11.5–15.5)
WBC: 13.2 10*3/uL — ABNORMAL HIGH (ref 4.0–10.5)

## 2021-07-27 LAB — BASIC METABOLIC PANEL
BUN: 12 mg/dL (ref 6–23)
CO2: 26 mEq/L (ref 19–32)
Calcium: 10.2 mg/dL (ref 8.4–10.5)
Chloride: 101 mEq/L (ref 96–112)
Creatinine, Ser: 0.78 mg/dL (ref 0.40–1.20)
GFR: 89.06 mL/min (ref >60.00)
Glucose, Bld: 85 mg/dL (ref 70–99)
Potassium: 4.1 mEq/L (ref 3.5–5.1)
Sodium: 137 mEq/L (ref 135–145)

## 2021-07-27 LAB — HEPATIC FUNCTION PANEL
ALT: 26 U/L (ref 0–35)
AST: 22 U/L (ref 0–37)
Albumin: 4.4 g/dL (ref 3.5–5.2)
Alkaline Phosphatase: 61 U/L (ref 39–117)
Bilirubin, Direct: 0 mg/dL (ref 0.0–0.3)
Total Bilirubin: 0.2 mg/dL (ref 0.2–1.2)
Total Protein: 7.9 g/dL (ref 6.0–8.3)

## 2021-07-27 LAB — LDL CHOLESTEROL, DIRECT: Direct LDL: 185 mg/dL

## 2021-07-27 LAB — LIPID PANEL
Cholesterol: 308 mg/dL — ABNORMAL HIGH (ref 0–200)
HDL: 54.1 mg/dL (ref >39.00)
Total CHOL/HDL Ratio: 6
Triglycerides: 412 mg/dL — ABNORMAL HIGH (ref 0.0–149.0)

## 2021-07-27 MED ORDER — SEMAGLUTIDE-WEIGHT MANAGEMENT 2.4 MG/0.75ML ~~LOC~~ SOAJ: 2.4000 mg | SUBCUTANEOUS | 0 refills | Status: AC

## 2021-07-27 MED ORDER — SEMAGLUTIDE-WEIGHT MANAGEMENT 1 MG/0.5ML ~~LOC~~ SOAJ: 1.0000 mg | SUBCUTANEOUS | 0 refills | Status: DC

## 2021-07-27 MED ORDER — SEMAGLUTIDE-WEIGHT MANAGEMENT 0.25 MG/0.5ML ~~LOC~~ SOAJ: 0.2500 mg | SUBCUTANEOUS | 0 refills | Status: AC

## 2021-07-27 MED ORDER — SEMAGLUTIDE-WEIGHT MANAGEMENT 1.7 MG/0.75ML ~~LOC~~ SOAJ: 1.7000 mg | SUBCUTANEOUS | 0 refills | Status: AC

## 2021-07-27 MED ORDER — VILAZODONE HCL 40 MG PO TABS: 40.0000 mg | ORAL_TABLET | Freq: Every day | ORAL | 1 refills | Status: DC

## 2021-07-27 MED ORDER — FETZIMA 20 MG PO CP24: 1.0000 | ORAL_CAPSULE | Freq: Every day | ORAL | 0 refills | Status: DC

## 2021-07-27 MED ORDER — SEMAGLUTIDE-WEIGHT MANAGEMENT 0.5 MG/0.5ML ~~LOC~~ SOAJ: 0.5000 mg | SUBCUTANEOUS | 0 refills | Status: AC

## 2021-07-27 NOTE — Patient Instructions (Signed)

## 2021-07-27 NOTE — Progress Notes (Signed)
Subjective:  Patient ID: Jennifer Mcknight, female    DOB: [birth year]  Age: 51 y.o. MRN: [id number]  CC: Annual Exam, Hypertension, and Depression   HPI Jennifer Mcknight presents for a CPX and f/up -   She recently tried to switch from [place] to Cymbalta but Cymbalta has caused nausea and vomiting and she complains of fatigue, irritability, apathy, and weight gain.  She would like to restart Fetzima.  She wishes to stay on the current dose of Viibryd.  Outpatient Medications Prior to Visit  Medication Sig Dispense Refill   Multiple Vitamin (MULTIVITAMIN WITH MINERALS) TABS tablet Take 1 tablet by mouth daily.     DULoxetine (CYMBALTA) 30 MG capsule Take 1 cap daily x 2 weeks then 2 caps daily 42 capsule 3   Vilazodone HCl (VIIBRYD) 40 MG TABS Take 1 tablet (40 mg total) by mouth daily. 90 tablet 0   No facility-administered medications prior to visit.    ROS Review of Systems  Constitutional:  Positive for fatigue and unexpected weight change (wt gain). Negative for diaphoresis.  HENT: Negative.    Eyes: Negative.   Respiratory:  Negative for chest tightness, shortness of breath and wheezing.   Cardiovascular:  Negative for chest pain, palpitations and leg swelling.  Gastrointestinal:  Negative for abdominal pain, diarrhea, nausea and vomiting.  Endocrine: Negative.   Genitourinary: Negative.  Negative for difficulty urinating and dysuria.  Musculoskeletal: Negative.  Negative for arthralgias and myalgias.  Skin: Negative.  Negative for color change.  Neurological: Negative.  Negative for dizziness, weakness and light-headedness.  Hematological:  Negative for adenopathy. Does not bruise/bleed easily.  Psychiatric/Behavioral:  Positive for dysphoric mood. Negative for agitation, behavioral problems, confusion, decreased concentration, self-injury, sleep disturbance and suicidal ideas. The patient is not nervous/anxious and is not hyperactive.     Objective:  BP 138/86 (BP Location: Left  Arm, Patient Position: Sitting, Cuff Size: Large)   Pulse 83   Temp 97.7 F (36.5 C) (Oral)   Ht 5' 7"  (1.702 m)   Wt 206 lb (93.4 kg)   SpO2 98%   BMI 32.26 kg/m   BP Readings from Last 3 Encounters:  07/27/21 138/86  06/16/21 140/84  07/16/20 (!) 162/102    Wt Readings from Last 3 Encounters:  07/27/21 206 lb (93.4 kg)  06/16/21 200 lb 6.4 oz (90.9 kg)  07/16/20 170 lb (77.1 kg)    Physical Exam Vitals reviewed.  Constitutional:      Appearance: She is not ill-appearing.  HENT:     Nose: Nose normal.     Mouth/Throat:     Mouth: Mucous membranes are moist.  Eyes:     General: No scleral icterus.    Conjunctiva/sclera: Conjunctivae normal.  Cardiovascular:     Rate and Rhythm: Regular rhythm. Tachycardia present.     Heart sounds: Normal heart sounds, S1 normal and S2 normal. No murmur heard.    No gallop.     Comments: EKG -  ST, 107 bpm NS ST/T wave changes PRWP is old No Q waves Pulmonary:     Effort: Pulmonary effort is normal.     Breath sounds: No stridor. No wheezing, rhonchi or rales.  Abdominal:     General: Abdomen is flat.     Palpations: There is no mass.     Tenderness: There is no abdominal tenderness. There is no guarding.     Hernia: No hernia is present.  Musculoskeletal:     Right lower leg: No edema.  Left lower leg: No edema.  Skin:    General: Skin is warm and dry.     Findings: No lesion.  Neurological:     General: No focal deficit present.     Mental Status: She is alert.  Psychiatric:        Mood and Affect: Mood normal.        Behavior: Behavior normal.     Lab Results  Component Value Date   WBC 13.2 (H) 07/27/2021   HGB 13.7 07/27/2021   HCT 42.1 07/27/2021   PLT 484.0 (H) 07/27/2021   GLUCOSE 85 07/27/2021   CHOL 308 (H) 07/27/2021   TRIG (H) 07/27/2021    412.0 Triglyceride is over 400; calculations on Lipids are invalid.   HDL 54.10 07/27/2021   LDLDIRECT 185.0 07/27/2021   LDLCALC 145 (H) 02/17/2020    ALT 26 07/27/2021   AST 22 07/27/2021   NA 137 07/27/2021   K 4.1 07/27/2021   CL 101 07/27/2021   CREATININE 0.78 07/27/2021   BUN 12 07/27/2021   CO2 26 07/27/2021   TSH 2.72 07/28/2021   INR 1.0 02/17/2020    No results found.  Assessment & Plan:   Jennifer Mcknight was seen today for annual exam, hypertension and depression.  Diagnoses and all orders for this visit:  Essential hypertension-- Her blood pressure is adequately well controlled.       -     Basic metabolic panel; Future -     Hepatic function panel; Future -     Hepatic function panel -     Basic metabolic panel -     EKG 24-QAST -     TSH; Future -     TSH  Thrombocytosis- She has a stable elevation in her platelet count and white cell count.  She is not anemic and her other cell lines are normal.  Will continue to monitor. -     CBC with Differential/Platelet; Future -     Hepatic function panel; Future -     Hepatic function panel -     CBC with Differential/Platelet  Encounter for general adult medical examination with abnormal findings-exam completed, labs reviewed-statin therapy is not indicated, vaccines reviewed-she deferred on the shingles vaccine, cancer screenings addressed patient education was given. -     Lipid panel; Future -     HIV Antibody (routine testing w rflx); Future -     HIV Antibody (routine testing w rflx) -     Lipid panel  Primary hypertension  Moderate episode of recurrent major depressive disorder (HCC) -     Vilazodone HCl (VIIBRYD) 40 MG TABS; Take 1 tablet (40 mg total) by mouth daily. -     Levomilnacipran HCl ER (FETZIMA) 20 MG CP24; Take 1 tablet by mouth daily.  Class 1 obesity due to excess calories with serious comorbidity and body mass index (BMI) of 32.0 to 32.9 in adult -     Semaglutide-Weight Management 0.25 MG/0.5ML SOAJ; Inject 0.25 mg into the skin once a week for 28 days. -     Semaglutide-Weight Management 1.7 MG/0.75ML SOAJ; Inject 1.7 mg into the skin once a  week for 28 days. -     Semaglutide-Weight Management 2.4 MG/0.75ML SOAJ; Inject 2.4 mg into the skin once a week for 28 days. -     Semaglutide-Weight Management 0.5 MG/0.5ML SOAJ; Inject 0.5 mg into the skin once a week for 28 days. -     Semaglutide-Weight Management 1 MG/0.5ML  SOAJ; Inject 1 mg into the skin once a week for 28 days. -     TSH; Future -     TSH  Screen for colon cancer -     Cologuard  Visit for screening mammogram -     MM DIGITAL SCREENING BILATERAL; Future  Other orders -     LDL cholesterol, direct   I have discontinued Bubba Hales DULoxetine. I am also having her start on Semaglutide-Weight Management, Semaglutide-Weight Management, Semaglutide-Weight Management, Semaglutide-Weight Management, Semaglutide-Weight Management, and Fetzima. Additionally, I am having her maintain her multivitamin with minerals and Vilazodone HCl.  Meds ordered this encounter  Medications   Vilazodone HCl (VIIBRYD) 40 MG TABS    Sig: Take 1 tablet (40 mg total) by mouth daily.    Dispense:  90 tablet    Refill:  1   Semaglutide-Weight Management 0.25 MG/0.5ML SOAJ    Sig: Inject 0.25 mg into the skin once a week for 28 days.    Dispense:  2 mL    Refill:  0   Semaglutide-Weight Management 1.7 MG/0.75ML SOAJ    Sig: Inject 1.7 mg into the skin once a week for 28 days.    Dispense:  3 mL    Refill:  0   Semaglutide-Weight Management 2.4 MG/0.75ML SOAJ    Sig: Inject 2.4 mg into the skin once a week for 28 days.    Dispense:  3 mL    Refill:  0   Semaglutide-Weight Management 0.5 MG/0.5ML SOAJ    Sig: Inject 0.5 mg into the skin once a week for 28 days.    Dispense:  2 mL    Refill:  0   Semaglutide-Weight Management 1 MG/0.5ML SOAJ    Sig: Inject 1 mg into the skin once a week for 28 days.    Dispense:  2 mL    Refill:  0   Levomilnacipran HCl ER (FETZIMA) 20 MG CP24    Sig: Take 1 tablet by mouth daily.    Dispense:  30 capsule    Refill:  0     Follow-up:  Return in about 6 months (around 01/26/2022).  Scarlette Calico, MD

## 2021-07-28 ENCOUNTER — Telehealth: Payer: Self-pay

## 2021-07-28 DIAGNOSIS — Z1211 Encounter for screening for malignant neoplasm of colon: Secondary | ICD-10-CM | POA: Insufficient documentation

## 2021-07-28 LAB — HIV ANTIBODY (ROUTINE TESTING W REFLEX): HIV 1&2 Ab, 4th Generation: NONREACTIVE

## 2021-07-28 LAB — TSH: TSH: 2.72 u[IU]/mL (ref 0.35–5.50)

## 2021-07-28 NOTE — Telephone Encounter (Signed)
Key: [id number]

## 2021-07-28 NOTE — Telephone Encounter (Signed)
Per CoverMyMeds: PA was denied.

## 2021-08-04 DIAGNOSIS — Z1211 Encounter for screening for malignant neoplasm of colon: Secondary | ICD-10-CM | POA: Diagnosis not present

## 2021-08-12 LAB — COLOGUARD: COLOGUARD: NEGATIVE

## 2021-08-13 LAB — COLOGUARD: Cologuard: NEGATIVE

## 2021-08-30 ENCOUNTER — Encounter: Payer: Self-pay | Admitting: Internal Medicine

## 2021-08-30 ENCOUNTER — Telehealth: Payer: Self-pay | Admitting: Internal Medicine

## 2021-08-30 ENCOUNTER — Other Ambulatory Visit: Payer: Self-pay | Admitting: Internal Medicine

## 2021-08-30 DIAGNOSIS — F331 Major depressive disorder, recurrent, moderate: Secondary | ICD-10-CM

## 2021-08-30 MED ORDER — FETZIMA 20 MG PO CP24: 1.0000 | ORAL_CAPSULE | Freq: Every day | ORAL | 1 refills | Status: DC

## 2021-08-30 NOTE — Telephone Encounter (Signed)
Pharmacey called to refill Levomilnacipran HCl ER (FETZIMA) 20 MG CP24 RX for the Pt.   Please send RX to [id number]   Phone: [phone]

## 2021-08-31 ENCOUNTER — Telehealth: Payer: Self-pay | Admitting: *Deleted

## 2021-08-31 NOTE — Telephone Encounter (Signed)
Rec'd fax pt need PA on Fetzima. Tried to process PA cover-my-meds not able to pull pt up. Sent pt msg need to clarify insurance, address and then see if I can process..Princella Pellegrini

## 2021-09-01 NOTE — Telephone Encounter (Signed)
Pt sent email back clarify insurance & address. Done PA on pt Fetzima 20 mg. Submitted w/ Key: [id number]. Rec'd msg PA sent to [facility]../l,[email or web address]

## 2021-09-01 NOTE — Telephone Encounter (Signed)
Rec'd msg med has been APPROVED. it states This request has received a Favorable outcome from [place]. Effective  from 09/01/2021 through 08/31/2022. Faxing approval to pof.Marland KitchenJohny Chess

## 2021-10-11 ENCOUNTER — Other Ambulatory Visit: Payer: Self-pay | Admitting: Internal Medicine

## 2021-10-11 ENCOUNTER — Encounter: Payer: Self-pay | Admitting: Internal Medicine

## 2021-10-11 DIAGNOSIS — E6609 Other obesity due to excess calories: Secondary | ICD-10-CM

## 2021-11-09 ENCOUNTER — Telehealth: Payer: Self-pay

## 2021-11-09 NOTE — Telephone Encounter (Signed)
Key: [id number]

## 2021-11-10 NOTE — Telephone Encounter (Signed)
PA denied per CoverMyMeds.  No reason given.   Appeal submitted via online.

## 2021-11-15 NOTE — Telephone Encounter (Signed)
Appeal was denied via CoverMyMeds.  No reason given via online. Stated a letter would be sent detailing the reasoning.

## 2022-01-23 ENCOUNTER — Other Ambulatory Visit: Payer: Self-pay | Admitting: Internal Medicine

## 2022-01-24 ENCOUNTER — Encounter: Payer: Self-pay | Admitting: Internal Medicine

## 2022-01-25 ENCOUNTER — Other Ambulatory Visit: Payer: Self-pay | Admitting: Internal Medicine

## 2022-01-25 DIAGNOSIS — E6609 Other obesity due to excess calories: Secondary | ICD-10-CM

## 2022-01-25 MED ORDER — WEGOVY 2.4 MG/0.75ML ~~LOC~~ SOAJ: 2.4000 mg | SUBCUTANEOUS | 0 refills | Status: DC

## 2022-01-28 ENCOUNTER — Other Ambulatory Visit: Payer: Self-pay | Admitting: Internal Medicine

## 2022-01-28 DIAGNOSIS — E6609 Other obesity due to excess calories: Secondary | ICD-10-CM

## 2022-02-24 ENCOUNTER — Other Ambulatory Visit: Payer: Self-pay | Admitting: Internal Medicine

## 2022-02-24 DIAGNOSIS — E6609 Other obesity due to excess calories: Secondary | ICD-10-CM

## 2022-03-03 ENCOUNTER — Ambulatory Visit (INDEPENDENT_AMBULATORY_CARE_PROVIDER_SITE_OTHER): Payer: BC Managed Care – PPO | Admitting: Internal Medicine

## 2022-03-03 ENCOUNTER — Encounter: Payer: Self-pay | Admitting: Internal Medicine

## 2022-03-03 VITALS — BP 128/82 | HR 92 | Temp 98.4°F | Resp 16 | Ht 67.0 in | Wt 176.0 lb

## 2022-03-03 DIAGNOSIS — E781 Pure hyperglyceridemia: Secondary | ICD-10-CM

## 2022-03-03 DIAGNOSIS — D508 Other iron deficiency anemias: Secondary | ICD-10-CM

## 2022-03-03 DIAGNOSIS — F331 Major depressive disorder, recurrent, moderate: Secondary | ICD-10-CM

## 2022-03-03 DIAGNOSIS — I10 Essential (primary) hypertension: Secondary | ICD-10-CM

## 2022-03-03 DIAGNOSIS — D75839 Thrombocytosis, unspecified: Secondary | ICD-10-CM | POA: Diagnosis not present

## 2022-03-03 DIAGNOSIS — Z1231 Encounter for screening mammogram for malignant neoplasm of breast: Secondary | ICD-10-CM

## 2022-03-03 DIAGNOSIS — Z124 Encounter for screening for malignant neoplasm of cervix: Secondary | ICD-10-CM

## 2022-03-03 LAB — CBC WITH DIFFERENTIAL/PLATELET
Basophils Absolute: 0.1 10*3/uL (ref 0.0–0.1)
Basophils Relative: 0.7 % (ref 0.0–3.0)
Eosinophils Absolute: 0.5 10*3/uL (ref 0.0–0.7)
Eosinophils Relative: 3.9 % (ref 0.0–5.0)
HCT: 38.8 % (ref 36.0–46.0)
Hemoglobin: 12.8 g/dL (ref 12.0–15.0)
Lymphocytes Relative: 28.2 % (ref 12.0–46.0)
Lymphs Abs: 3.6 10*3/uL (ref 0.7–4.0)
MCHC: 32.9 g/dL (ref 30.0–36.0)
MCV: 81.8 fl (ref 78.0–100.0)
Monocytes Absolute: 0.7 10*3/uL (ref 0.1–1.0)
Monocytes Relative: 5.8 % (ref 3.0–12.0)
Neutro Abs: 7.8 10*3/uL — ABNORMAL HIGH (ref 1.4–7.7)
Neutrophils Relative %: 61.4 % (ref 43.0–77.0)
Platelets: 550 10*3/uL — ABNORMAL HIGH (ref 150.0–400.0)
RBC: 4.75 Mil/uL (ref 3.87–5.11)
RDW: 14.4 % (ref 11.5–15.5)
WBC: 12.6 10*3/uL — ABNORMAL HIGH (ref 4.0–10.5)

## 2022-03-03 LAB — LIPID PANEL
Cholesterol: 223 mg/dL — ABNORMAL HIGH (ref 0–200)
HDL: 49.8 mg/dL (ref >39.00)
LDL Cholesterol: 149 mg/dL — ABNORMAL HIGH (ref 0–99)
NonHDL: 173.36
Total CHOL/HDL Ratio: 4
Triglycerides: 123 mg/dL (ref 0.0–149.0)
VLDL: 24.6 mg/dL (ref 0.0–40.0)

## 2022-03-03 LAB — BASIC METABOLIC PANEL
BUN: 8 mg/dL (ref 6–23)
CO2: 25 mEq/L (ref 19–32)
Calcium: 9.4 mg/dL (ref 8.4–10.5)
Chloride: 105 mEq/L (ref 96–112)
Creatinine, Ser: 0.9 mg/dL (ref 0.40–1.20)
GFR: 74.69 mL/min (ref >60.00)
Glucose, Bld: 93 mg/dL (ref 70–99)
Potassium: 3.9 mEq/L (ref 3.5–5.1)
Sodium: 140 mEq/L (ref 135–145)

## 2022-03-03 LAB — IBC + FERRITIN
Ferritin: 6.1 ng/mL — ABNORMAL LOW (ref 10.0–291.0)
Iron: 53 ug/dL (ref 42–145)
Saturation Ratios: 10.8 % — ABNORMAL LOW (ref 20.0–50.0)
TIBC: 491.4 ug/dL — ABNORMAL HIGH (ref 250.0–450.0)
Transferrin: 351 mg/dL (ref 212.0–360.0)

## 2022-03-03 MED ORDER — FETZIMA 20 MG PO CP24: 1.0000 | ORAL_CAPSULE | Freq: Every day | ORAL | 1 refills | Status: DC

## 2022-03-03 NOTE — Patient Instructions (Signed)
Hypertension, Adult High blood pressure (hypertension) is when the force of blood pumping through the arteries is too strong. The arteries are the blood vessels that carry blood from the heart throughout the body. Hypertension forces the heart to work harder to pump blood and may cause arteries to become narrow or stiff. Untreated or uncontrolled hypertension can lead to a heart attack, heart failure, a stroke, kidney disease, and other problems. A blood pressure reading consists of a higher number over a lower number. Ideally, your blood pressure should be below 120/80. The first ("top") number is called the systolic pressure. It is a measure of the pressure in your arteries as your heart beats. The second ("bottom") number is called the diastolic pressure. It is a measure of the pressure in your arteries as the heart relaxes. What are the causes? The exact cause of this condition is not known. There are some conditions that result in high blood pressure. What increases the risk? Certain factors may make you more likely to develop high blood pressure. Some of these risk factors are under your control, including: Smoking. Not getting enough exercise or physical activity. Being overweight. Having too much fat, sugar, calories, or salt (sodium) in your diet. Drinking too much alcohol. Other risk factors include: Having a personal history of heart disease, diabetes, high cholesterol, or kidney disease. Stress. Having a family history of high blood pressure and high cholesterol. Having obstructive sleep apnea. Age. The risk increases with age. What are the signs or symptoms? High blood pressure may not cause symptoms. Very high blood pressure (hypertensive crisis) may cause: Headache. Fast or irregular heartbeats (palpitations). Shortness of breath. Nosebleed. Nausea and vomiting. Vision changes. Severe chest pain, dizziness, and seizures. How is this diagnosed? This condition is diagnosed by  measuring your blood pressure while you are seated, with your arm resting on a flat surface, your legs uncrossed, and your feet flat on the floor. The cuff of the blood pressure monitor will be placed directly against the skin of your upper arm at the level of your heart. Blood pressure should be measured at least twice using the same arm. Certain conditions can cause a difference in blood pressure between your right and left arms. If you have a high blood pressure reading during one visit or you have normal blood pressure with other risk factors, you may be asked to: Return on a different day to have your blood pressure checked again. Monitor your blood pressure at home for 1 week or longer. If you are diagnosed with hypertension, you may have other blood or imaging tests to help your health care provider understand your overall risk for other conditions. How is this treated? This condition is treated by making healthy lifestyle changes, such as eating healthy foods, exercising more, and reducing your alcohol intake. You may be referred for counseling on a healthy diet and physical activity. Your health care provider may prescribe medicine if lifestyle changes are not enough to get your blood pressure under control and if: Your systolic blood pressure is above 130. Your diastolic blood pressure is above 80. Your personal target blood pressure may vary depending on your medical conditions, your age, and other factors. Follow these instructions at home: Eating and drinking  Eat a diet that is high in fiber and potassium, and low in sodium, added sugar, and fat. An example of this eating plan is called the DASH diet. DASH stands for Dietary Approaches to Stop Hypertension. To eat this way: Eat   plenty of fresh fruits and vegetables. Try to fill one half of your plate at each meal with fruits and vegetables. Eat whole grains, such as whole-wheat pasta, brown rice, or whole-grain bread. Fill about one  fourth of your plate with whole grains. Eat or drink low-fat dairy products, such as skim milk or low-fat yogurt. Avoid fatty cuts of meat, processed or cured meats, and poultry with skin. Fill about one fourth of your plate with lean proteins, such as fish, chicken without skin, beans, eggs, or tofu. Avoid pre-made and processed foods. These tend to be higher in sodium, added sugar, and fat. Reduce your daily sodium intake. Many people with hypertension should eat less than 1,500 mg of sodium a day. Do not drink alcohol if: Your health care provider tells you not to drink. You are pregnant, may be pregnant, or are planning to become pregnant. If you drink alcohol: Limit how much you have to: 0-1 drink a day for women. 0-2 drinks a day for men. Know how much alcohol is in your drink. In the [place] one drink equals one 12 oz bottle of beer (355 mL), one 5 oz glass of wine (148 mL), or one 1 oz glass of hard liquor (44 mL). Lifestyle  Work with your health care provider to maintain a healthy body weight or to lose weight. Ask what an ideal weight is for you. Get at least 30 minutes of exercise that causes your heart to beat faster (aerobic exercise) most days of the week. Activities may include walking, swimming, or biking. Include exercise to strengthen your muscles (resistance exercise), such as Pilates or lifting weights, as part of your weekly exercise routine. Try to do these types of exercises for 30 minutes at least 3 days a week. Do not use any products that contain nicotine or tobacco. These products include cigarettes, chewing tobacco, and vaping devices, such as e-cigarettes. If you need help quitting, ask your health care provider. Monitor your blood pressure at home as told by your health care provider. Keep all follow-up visits. This is important. Medicines Take over-the-counter and prescription medicines only as told by your health care provider. Follow directions carefully. Blood  pressure medicines must be taken as prescribed. Do not skip doses of blood pressure medicine. Doing this puts you at risk for problems and can make the medicine less effective. Ask your health care provider about side effects or reactions to medicines that you should watch for. Contact a health care provider if you: Think you are having a reaction to a medicine you are taking. Have headaches that keep coming back (recurring). Feel dizzy. Have swelling in your ankles. Have trouble with your vision. Get help right away if you: Develop a severe headache or confusion. Have unusual weakness or numbness. Feel faint. Have severe pain in your chest or abdomen. Vomit repeatedly. Have trouble breathing. These symptoms may be an emergency. Get help right away. Call 911. Do not wait to see if the symptoms will go away. Do not drive yourself to the hospital. Summary Hypertension is when the force of blood pumping through your arteries is too strong. If this condition is not controlled, it may put you at risk for serious complications. Your personal target blood pressure may vary depending on your medical conditions, your age, and other factors. For most people, a normal blood pressure is less than 120/80. Hypertension is treated with lifestyle changes, medicines, or a combination of both. Lifestyle changes include losing weight, eating a healthy,   low-sodium diet, exercising more, and limiting alcohol. This information is not intended to replace advice given to you by your health care provider. Make sure you discuss any questions you have with your health care provider. Document Revised: 12/15/2020 Document Reviewed: 12/15/2020 Elsevier Patient Education  2023 Elsevier Inc.  

## 2022-03-03 NOTE — Progress Notes (Unsigned)
Subjective:  Patient ID: York Ram, female    DOB: 1970/07/03  Age: 52 y.o. MRN: [id number]  CC: No chief complaint on file.   HPI Kinzley Savell presents for ***  Outpatient Medications Prior to Visit  Medication Sig Dispense Refill   Multiple Vitamin (MULTIVITAMIN WITH MINERALS) TABS tablet Take 1 tablet by mouth daily.     Vilazodone HCl (VIIBRYD) 40 MG TABS Take 1 tablet (40 mg total) by mouth daily. 90 tablet 1   WEGOVY 2.4 MG/0.75ML SOAJ INJECT THE CONTENTS OF ONE PEN UNDER THE SKIN ONCE WEEKLY ON THE SAME DAY EACH WEEK 3 mL 0   Levomilnacipran HCl ER (FETZIMA) 20 MG CP24 Take 1 tablet by mouth daily. 90 capsule 1   No facility-administered medications prior to visit.    ROS Review of Systems  Objective:  BP 128/82 (BP Location: Left Arm, Patient Position: Sitting, Cuff Size: Large)   Pulse 92   Temp 98.4 F (36.9 C) (Oral)   Ht '5\' 7"'$  (1.702 m)   Wt 176 lb (79.8 kg)   SpO2 98%   BMI 27.57 kg/m   BP Readings from Last 3 Encounters:  03/03/22 128/82  07/27/21 138/86  06/16/21 140/84    Wt Readings from Last 3 Encounters:  03/03/22 176 lb (79.8 kg)  07/27/21 206 lb (93.4 kg)  06/16/21 200 lb 6.4 oz (90.9 kg)    Physical Exam  Lab Results  Component Value Date   WBC 12.6 (H) 03/03/2022   HGB 12.8 03/03/2022   HCT 38.8 03/03/2022   PLT 550.0 (H) 03/03/2022   GLUCOSE 93 03/03/2022   CHOL 223 (H) 03/03/2022   TRIG 123.0 03/03/2022   HDL 49.80 03/03/2022   LDLDIRECT 185.0 07/27/2021   LDLCALC 149 (H) 03/03/2022   ALT 26 07/27/2021   AST 22 07/27/2021   NA 140 03/03/2022   K 3.9 03/03/2022   CL 105 03/03/2022   CREATININE 0.90 03/03/2022   BUN 8 03/03/2022   CO2 25 03/03/2022   TSH 2.72 07/28/2021   INR 1.0 02/17/2020    No results found.  Assessment & Plan:   Diagnoses and all orders for this visit:  Moderate episode of recurrent major depressive disorder (HCC) -     Levomilnacipran HCl ER (FETZIMA) 20 MG CP24; Take 1 tablet by mouth  daily.  Thrombocytosis -     IBC + Ferritin; Future -     Basic metabolic panel; Future -     CBC with Differential/Platelet; Future -     CBC with Differential/Platelet -     Basic metabolic panel -     IBC + Ferritin -     Hepatic function panel; Future  Pure hyperglyceridemia -     Lipid panel; Future -     Lipid panel -     Hepatic function panel; Future  Essential hypertension -     Basic metabolic panel; Future -     CBC with Differential/Platelet; Future -     CBC with Differential/Platelet -     Basic metabolic panel -     Hepatic function panel; Future   I am having York Ram maintain her multivitamin with minerals, Vilazodone HCl, Wegovy, and Fetzima.  Meds ordered this encounter  Medications   Levomilnacipran HCl ER (FETZIMA) 20 MG CP24    Sig: Take 1 tablet by mouth daily.    Dispense:  90 capsule    Refill:  1     Follow-up: No follow-ups on file.  Scarlette Calico, MD

## 2022-03-04 ENCOUNTER — Other Ambulatory Visit (INDEPENDENT_AMBULATORY_CARE_PROVIDER_SITE_OTHER): Payer: BC Managed Care – PPO

## 2022-03-04 DIAGNOSIS — D75839 Thrombocytosis, unspecified: Secondary | ICD-10-CM

## 2022-03-04 DIAGNOSIS — I10 Essential (primary) hypertension: Secondary | ICD-10-CM | POA: Insufficient documentation

## 2022-03-04 DIAGNOSIS — D508 Other iron deficiency anemias: Secondary | ICD-10-CM | POA: Insufficient documentation

## 2022-03-04 DIAGNOSIS — E781 Pure hyperglyceridemia: Secondary | ICD-10-CM | POA: Diagnosis not present

## 2022-03-04 LAB — HEPATIC FUNCTION PANEL
ALT: 26 U/L (ref 0–35)
AST: 30 U/L (ref 0–37)
Albumin: 4.6 g/dL (ref 3.5–5.2)
Alkaline Phosphatase: 65 U/L (ref 39–117)
Bilirubin, Direct: 0.1 mg/dL (ref 0.0–0.3)
Total Bilirubin: 0.3 mg/dL (ref 0.2–1.2)
Total Protein: 7.7 g/dL (ref 6.0–8.3)

## 2022-03-04 MED ORDER — ACCRUFER 30 MG PO CAPS: 1.0000 | ORAL_CAPSULE | Freq: Two times a day (BID) | ORAL | 0 refills | Status: DC

## 2022-03-18 ENCOUNTER — Encounter: Payer: Self-pay | Admitting: Internal Medicine

## 2022-03-18 DIAGNOSIS — D75839 Thrombocytosis, unspecified: Secondary | ICD-10-CM

## 2022-03-18 DIAGNOSIS — D508 Other iron deficiency anemias: Secondary | ICD-10-CM

## 2022-03-18 MED ORDER — ACCRUFER 30 MG PO CAPS: 1.0000 | ORAL_CAPSULE | Freq: Two times a day (BID) | ORAL | 0 refills | Status: DC

## 2022-03-22 ENCOUNTER — Inpatient Hospital Stay: Admission: RE | Admit: 2022-03-22 | Payer: BC Managed Care – PPO | Source: Ambulatory Visit

## 2022-03-28 ENCOUNTER — Other Ambulatory Visit: Payer: Self-pay | Admitting: Internal Medicine

## 2022-03-28 DIAGNOSIS — E6609 Other obesity due to excess calories: Secondary | ICD-10-CM

## 2022-04-08 ENCOUNTER — Telehealth: Payer: Self-pay

## 2022-04-08 NOTE — Telephone Encounter (Signed)
Pharmacy Patient Advocate Encounter  Received notification from [facility] that the request for prior authorization for [id number] has been denied due to No reason given, no denial letter received.

## 2022-04-18 ENCOUNTER — Other Ambulatory Visit: Payer: Self-pay | Admitting: Internal Medicine

## 2022-04-18 ENCOUNTER — Encounter: Payer: Self-pay | Admitting: Internal Medicine

## 2022-04-18 DIAGNOSIS — E6609 Other obesity due to excess calories: Secondary | ICD-10-CM

## 2022-04-18 MED ORDER — NOVOFINE PEN NEEDLE 32G X 6 MM MISC: 1.0000 | 0 refills | Status: DC

## 2022-04-18 MED ORDER — WEGOVY 2.4 MG/0.75ML ~~LOC~~ SOAJ: SUBCUTANEOUS | 0 refills | Status: DC

## 2022-04-25 ENCOUNTER — Other Ambulatory Visit: Payer: Self-pay | Admitting: Internal Medicine

## 2022-04-25 DIAGNOSIS — F331 Major depressive disorder, recurrent, moderate: Secondary | ICD-10-CM

## 2022-05-11 ENCOUNTER — Other Ambulatory Visit: Payer: Self-pay | Admitting: Internal Medicine

## 2022-05-11 ENCOUNTER — Encounter: Payer: Self-pay | Admitting: Internal Medicine

## 2022-05-11 DIAGNOSIS — F331 Major depressive disorder, recurrent, moderate: Secondary | ICD-10-CM

## 2022-05-11 MED ORDER — FETZIMA 20 MG PO CP24: 20.0000 mg | ORAL_CAPSULE | Freq: Every day | ORAL | 1 refills | Status: DC

## 2022-05-19 ENCOUNTER — Encounter: Payer: Self-pay | Admitting: Internal Medicine

## 2022-06-01 ENCOUNTER — Encounter: Payer: Self-pay | Admitting: Internal Medicine

## 2022-06-01 ENCOUNTER — Other Ambulatory Visit: Payer: Self-pay | Admitting: Internal Medicine

## 2022-06-01 DIAGNOSIS — E6609 Other obesity due to excess calories: Secondary | ICD-10-CM

## 2022-06-01 MED ORDER — SEMAGLUTIDE-WEIGHT MANAGEMENT 0.5 MG/0.5ML ~~LOC~~ SOAJ: 0.5000 mg | SUBCUTANEOUS | 0 refills | Status: DC

## 2022-06-01 MED ORDER — SEMAGLUTIDE-WEIGHT MANAGEMENT 1 MG/0.5ML ~~LOC~~ SOAJ: 1.0000 mg | SUBCUTANEOUS | 0 refills | Status: AC

## 2022-06-01 MED ORDER — SEMAGLUTIDE-WEIGHT MANAGEMENT 0.25 MG/0.5ML ~~LOC~~ SOAJ: 0.2500 mg | SUBCUTANEOUS | 0 refills | Status: DC

## 2022-06-01 MED ORDER — SEMAGLUTIDE-WEIGHT MANAGEMENT 2.4 MG/0.75ML ~~LOC~~ SOAJ: 2.4000 mg | SUBCUTANEOUS | 0 refills | Status: DC

## 2022-06-01 MED ORDER — SEMAGLUTIDE-WEIGHT MANAGEMENT 1.7 MG/0.75ML ~~LOC~~ SOAJ: 1.7000 mg | SUBCUTANEOUS | 0 refills | Status: DC

## 2022-06-02 ENCOUNTER — Other Ambulatory Visit: Payer: Self-pay | Admitting: Internal Medicine

## 2022-06-02 DIAGNOSIS — Z6832 Body mass index (BMI) 32.0-32.9, adult: Secondary | ICD-10-CM

## 2022-06-02 MED ORDER — SEMAGLUTIDE-WEIGHT MANAGEMENT 0.5 MG/0.5ML ~~LOC~~ SOAJ: 0.5000 mg | SUBCUTANEOUS | 0 refills | Status: AC

## 2022-06-02 MED ORDER — SEMAGLUTIDE-WEIGHT MANAGEMENT 0.25 MG/0.5ML ~~LOC~~ SOAJ
0.2500 mg | SUBCUTANEOUS | 0 refills | Status: AC
Start: 2022-06-02 — End: 2022-06-30

## 2022-06-30 ENCOUNTER — Telehealth: Payer: Self-pay

## 2022-06-30 NOTE — Telephone Encounter (Signed)
Patient Advocate Encounter  Received a fax from [employer] regarding Prior Authorization for [employer] 0.5MG /0.5ML auto-injectors.  Key: [id number]   Authorization has been DENIED due to    Determination letter attached to patient chart

## 2022-07-04 IMAGING — DX DG CHEST 2V
2 series · 2 of 2 positions shown · non-contrast
Comparison: 11/06/2018.

CLINICAL DATA: Cough.  History of pneumonia.

EXAM:
CHEST - 2 VIEW

[chest pa]
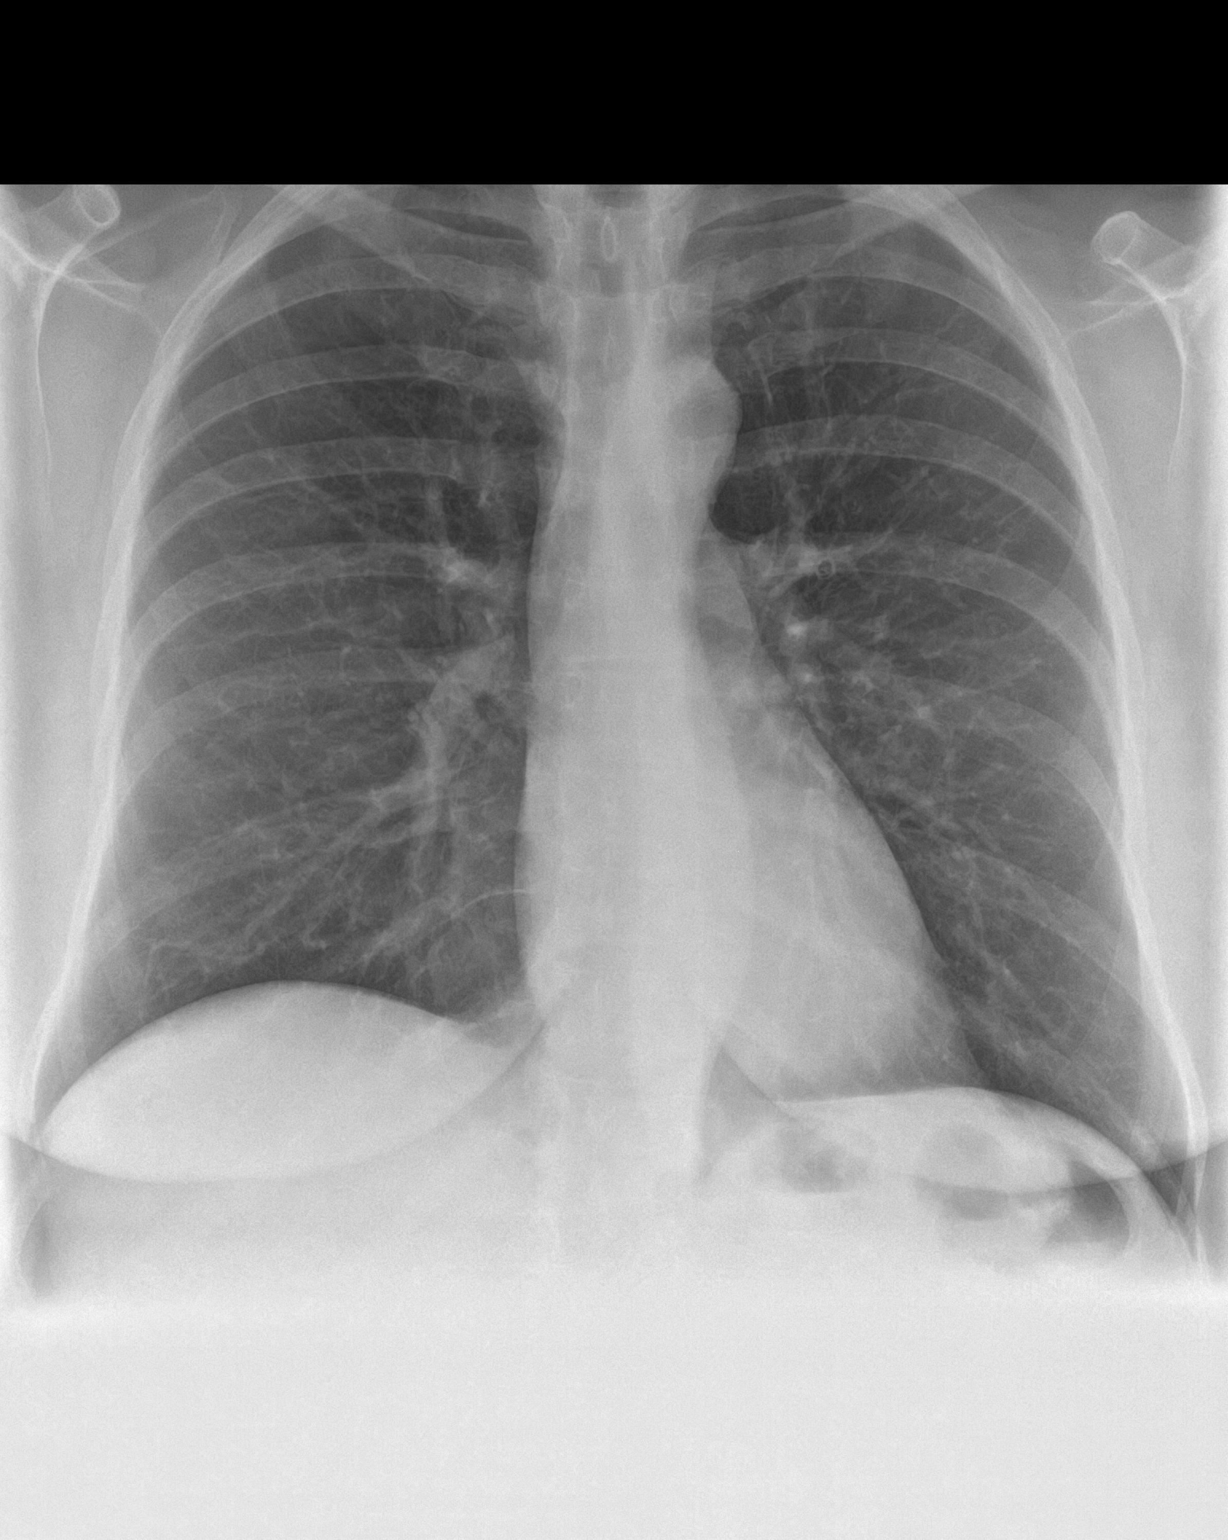

[chest lat]
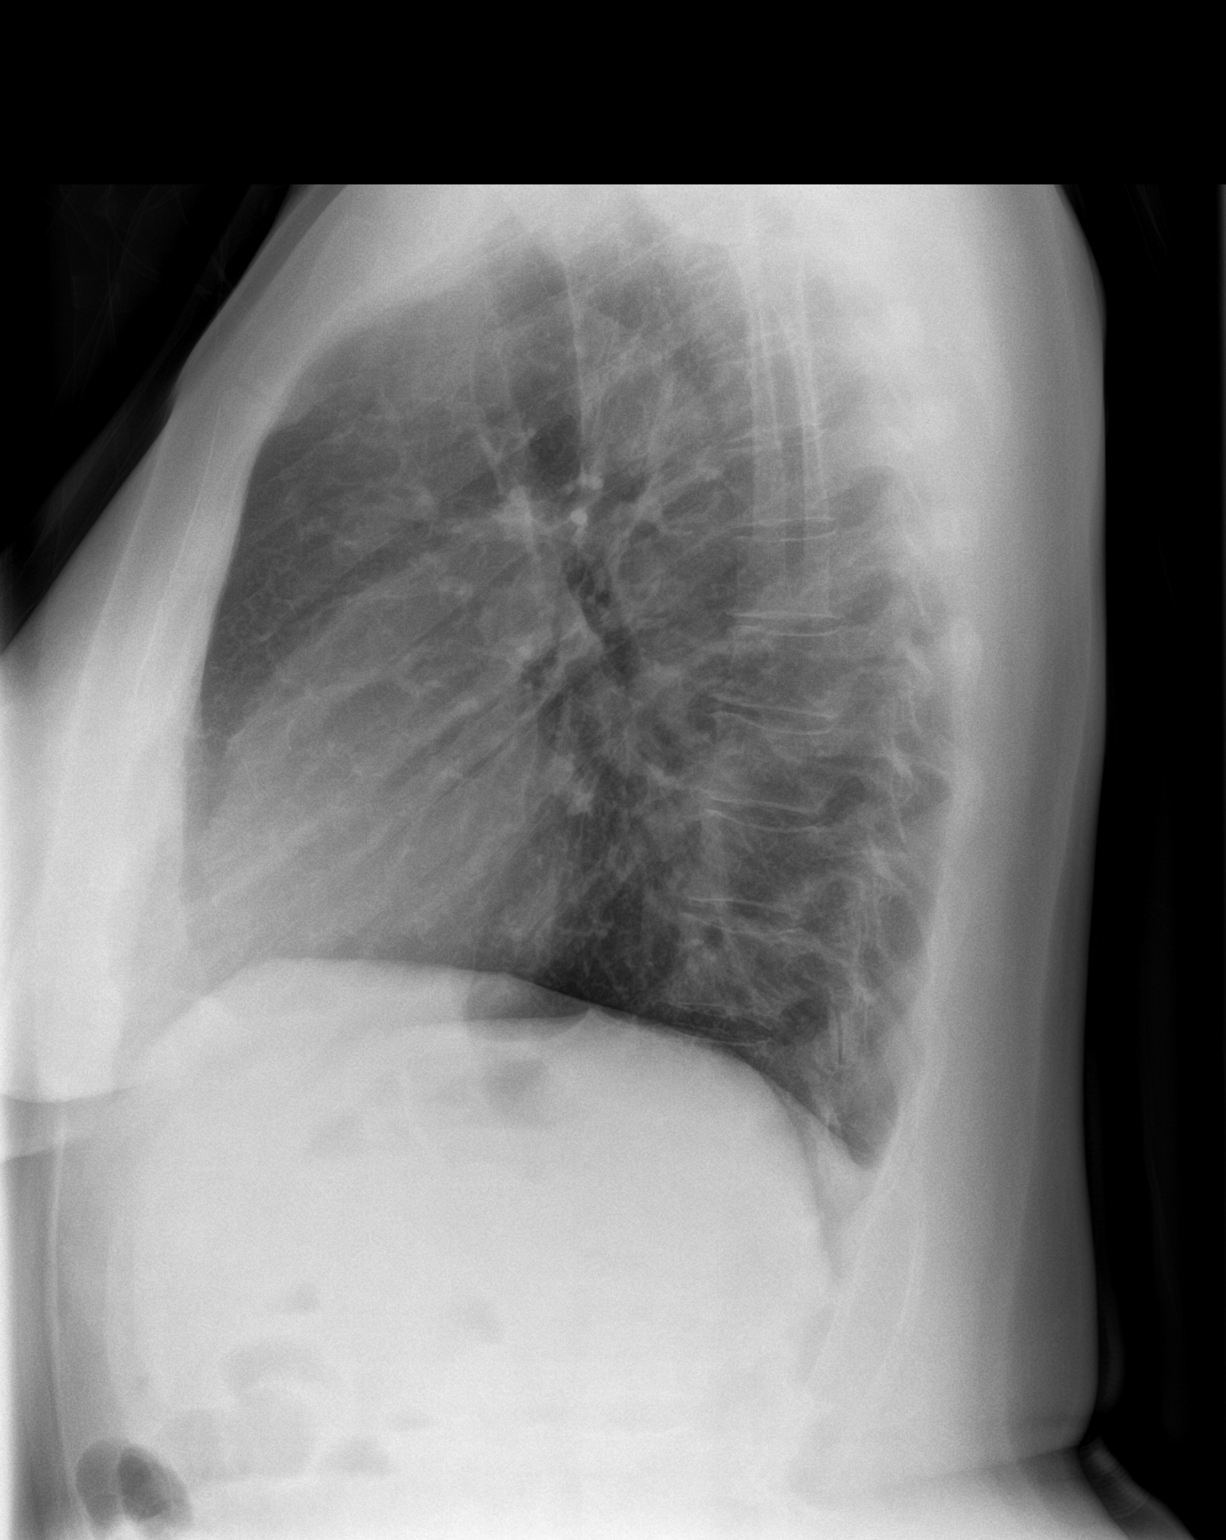

[2 of 2 positions shown; findings below may reference images not displayed]

FINDINGS: The heart size and mediastinal contours are within normal limits.
Both lungs are clear. No visible pleural effusions or pneumothorax.
No acute osseous abnormality.
IMPRESSION: No active cardiopulmonary disease.

## 2022-09-13 ENCOUNTER — Other Ambulatory Visit: Payer: Self-pay | Admitting: Internal Medicine

## 2022-09-13 DIAGNOSIS — F331 Major depressive disorder, recurrent, moderate: Secondary | ICD-10-CM

## 2022-09-15 ENCOUNTER — Other Ambulatory Visit: Payer: Self-pay | Admitting: Internal Medicine

## 2022-09-15 DIAGNOSIS — E6609 Other obesity due to excess calories: Secondary | ICD-10-CM

## 2022-09-27 ENCOUNTER — Other Ambulatory Visit: Payer: Self-pay | Admitting: Internal Medicine

## 2022-09-27 DIAGNOSIS — F331 Major depressive disorder, recurrent, moderate: Secondary | ICD-10-CM

## 2022-09-28 DIAGNOSIS — B078 Other viral warts: Secondary | ICD-10-CM | POA: Diagnosis not present

## 2022-09-28 DIAGNOSIS — D485 Neoplasm of uncertain behavior of skin: Secondary | ICD-10-CM | POA: Diagnosis not present

## 2022-10-13 ENCOUNTER — Other Ambulatory Visit: Payer: Self-pay | Admitting: Internal Medicine

## 2022-10-13 DIAGNOSIS — E6609 Other obesity due to excess calories: Secondary | ICD-10-CM

## 2022-10-24 ENCOUNTER — Other Ambulatory Visit: Payer: Self-pay | Admitting: Internal Medicine

## 2022-10-24 DIAGNOSIS — F331 Major depressive disorder, recurrent, moderate: Secondary | ICD-10-CM

## 2022-11-02 ENCOUNTER — Other Ambulatory Visit: Payer: Self-pay | Admitting: Internal Medicine

## 2022-11-02 ENCOUNTER — Encounter: Payer: Self-pay | Admitting: Internal Medicine

## 2022-11-02 DIAGNOSIS — D508 Other iron deficiency anemias: Secondary | ICD-10-CM

## 2022-11-02 DIAGNOSIS — D75839 Thrombocytosis, unspecified: Secondary | ICD-10-CM

## 2022-11-02 DIAGNOSIS — F331 Major depressive disorder, recurrent, moderate: Secondary | ICD-10-CM

## 2022-11-10 ENCOUNTER — Ambulatory Visit (INDEPENDENT_AMBULATORY_CARE_PROVIDER_SITE_OTHER): Payer: BC Managed Care – PPO

## 2022-11-10 ENCOUNTER — Ambulatory Visit (INDEPENDENT_AMBULATORY_CARE_PROVIDER_SITE_OTHER): Payer: BC Managed Care – PPO | Admitting: Internal Medicine

## 2022-11-10 ENCOUNTER — Telehealth: Payer: Self-pay | Admitting: Internal Medicine

## 2022-11-10 ENCOUNTER — Encounter: Payer: Self-pay | Admitting: Internal Medicine

## 2022-11-10 VITALS — BP 152/106 | HR 95 | Temp 98.6°F | Resp 16 | Ht 67.0 in | Wt 178.4 lb

## 2022-11-10 DIAGNOSIS — I10 Essential (primary) hypertension: Secondary | ICD-10-CM

## 2022-11-10 DIAGNOSIS — D75839 Thrombocytosis, unspecified: Secondary | ICD-10-CM | POA: Diagnosis not present

## 2022-11-10 DIAGNOSIS — F331 Major depressive disorder, recurrent, moderate: Secondary | ICD-10-CM

## 2022-11-10 DIAGNOSIS — R0989 Other specified symptoms and signs involving the circulatory and respiratory systems: Secondary | ICD-10-CM | POA: Diagnosis not present

## 2022-11-10 DIAGNOSIS — R0789 Other chest pain: Secondary | ICD-10-CM

## 2022-11-10 DIAGNOSIS — R079 Chest pain, unspecified: Secondary | ICD-10-CM | POA: Diagnosis not present

## 2022-11-10 DIAGNOSIS — R7989 Other specified abnormal findings of blood chemistry: Secondary | ICD-10-CM

## 2022-11-10 DIAGNOSIS — D508 Other iron deficiency anemias: Secondary | ICD-10-CM | POA: Diagnosis not present

## 2022-11-10 DIAGNOSIS — Z1231 Encounter for screening mammogram for malignant neoplasm of breast: Secondary | ICD-10-CM | POA: Insufficient documentation

## 2022-11-10 LAB — CBC WITH DIFFERENTIAL/PLATELET
Basophils Absolute: 0.2 10*3/uL — ABNORMAL HIGH (ref 0.0–0.1)
Basophils Relative: 1.3 % (ref 0.0–3.0)
Eosinophils Absolute: 0.7 10*3/uL (ref 0.0–0.7)
Eosinophils Relative: 5.3 % — ABNORMAL HIGH (ref 0.0–5.0)
HCT: 36.1 % (ref 36.0–46.0)
Hemoglobin: 11.3 g/dL — ABNORMAL LOW (ref 12.0–15.0)
Lymphocytes Relative: 32.2 % (ref 12.0–46.0)
Lymphs Abs: 4.2 10*3/uL — ABNORMAL HIGH (ref 0.7–4.0)
MCHC: 31.2 g/dL (ref 30.0–36.0)
MCV: 74.8 fl — ABNORMAL LOW (ref 78.0–100.0)
Monocytes Absolute: 0.8 10*3/uL (ref 0.1–1.0)
Monocytes Relative: 5.9 % (ref 3.0–12.0)
Neutro Abs: 7.2 10*3/uL (ref 1.4–7.7)
Neutrophils Relative %: 55.3 % (ref 43.0–77.0)
Platelets: 695 10*3/uL — ABNORMAL HIGH (ref 150.0–400.0)
RBC: 4.82 Mil/uL (ref 3.87–5.11)
RDW: 16.2 % — ABNORMAL HIGH (ref 11.5–15.5)
WBC: 13 10*3/uL — ABNORMAL HIGH (ref 4.0–10.5)

## 2022-11-10 LAB — BASIC METABOLIC PANEL
BUN: 8 mg/dL (ref 6–23)
CO2: 27 mEq/L (ref 19–32)
Calcium: 10.1 mg/dL (ref 8.4–10.5)
Chloride: 101 mEq/L (ref 96–112)
Creatinine, Ser: 0.77 mg/dL (ref 0.40–1.20)
GFR: 89.63 mL/min (ref >60.00)
Glucose, Bld: 91 mg/dL (ref 70–99)
Potassium: 3.5 mEq/L (ref 3.5–5.1)
Sodium: 139 mEq/L (ref 135–145)

## 2022-11-10 LAB — D-DIMER, QUANTITATIVE: D-Dimer, Quant: 0.52 mcg/mL FEU — ABNORMAL HIGH (ref <0.50)

## 2022-11-10 LAB — URINALYSIS, ROUTINE W REFLEX MICROSCOPIC
Bilirubin Urine: NEGATIVE
Hgb urine dipstick: NEGATIVE
Ketones, ur: NEGATIVE
Leukocytes,Ua: NEGATIVE
Nitrite: NEGATIVE
RBC / HPF: NONE SEEN (ref 0–?)
Specific Gravity, Urine: 1.02 (ref 1.000–1.030)
Total Protein, Urine: NEGATIVE
Urine Glucose: NEGATIVE
Urobilinogen, UA: 0.2 (ref 0.0–1.0)
WBC, UA: NONE SEEN (ref 0–?)
pH: 7.5 (ref 5.0–8.0)

## 2022-11-10 LAB — IBC + FERRITIN
Ferritin: 5.2 ng/mL — ABNORMAL LOW (ref 10.0–291.0)
Iron: 27 ug/dL — ABNORMAL LOW (ref 42–145)
Saturation Ratios: 4.8 % — ABNORMAL LOW (ref 20.0–50.0)
TIBC: 565.6 ug/dL — ABNORMAL HIGH (ref 250.0–450.0)
Transferrin: 404 mg/dL — ABNORMAL HIGH (ref 212.0–360.0)

## 2022-11-10 LAB — TROPONIN I (HIGH SENSITIVITY): High Sens Troponin I: 7 ng/L (ref 2–17)

## 2022-11-10 LAB — BRAIN NATRIURETIC PEPTIDE: Pro B Natriuretic peptide (BNP): 39 pg/mL (ref 0.0–100.0)

## 2022-11-10 LAB — TSH: TSH: 2.6 u[IU]/mL (ref 0.35–5.50)

## 2022-11-10 MED ORDER — VILAZODONE HCL 40 MG PO TABS
40.0000 mg | ORAL_TABLET | Freq: Every day | ORAL | 0 refills | Status: DC
Start: 2022-11-10 — End: 2023-01-16

## 2022-11-10 MED ORDER — INDAPAMIDE 1.25 MG PO TABS
1.2500 mg | ORAL_TABLET | Freq: Every day | ORAL | 0 refills | Status: DC
Start: 2022-11-10 — End: 2023-05-16

## 2022-11-10 NOTE — Progress Notes (Signed)
Subjective:  Patient ID: Jennifer Mcknight, female    DOB: 1971/02/07  Age: 52 y.o. MRN: [id number]  CC: Anxiety, Depression, and Hypertension   HPI Jennifer Mcknight presents for f/up ---   Discussed the use of AI scribe software for clinical note transcription with the patient, who gave verbal consent to proceed.  History of Present Illness   The patient has been experiencing significant emotional distress due to a family member's recent health crisis. The family member's illness has progressed rapidly, leading to upcoming surgery for a J patch. The patient has been struggling with witnessing their loved one's suffering but denies any suicidal ideation.  The patient has also been experiencing physical symptoms, including chest tightness and palpitations, which they attribute to anxiety. They deny any headache or blurred vision that might suggest hypertensive urgency. They also report regular menstrual cycles, with the last one at the beginning of the current month, and deny any abnormal vaginal discharge or bleeding.  The patient's medication regimen has been affected by the family member's medical expenses, leading to discontinuation of Wegovy due to cost. They also report not taking iron supplements for the same reason.       Outpatient Medications Prior to Visit  Medication Sig Dispense Refill   FETZIMA 20 MG CP24 TAKE ONE CAPSULE BY MOUTH ONE TIME DAILY 90 capsule 0   Multiple Vitamin (MULTIVITAMIN WITH MINERALS) TABS tablet Take 1 tablet by mouth daily.     Vilazodone HCl (VIIBRYD) 40 MG TABS TAKE ONE TABLET BY MOUTH ONE TIME DAILY 90 tablet 0   Ferric Maltol (ACCRUFER) 30 MG CAPS Take 1 capsule (30 mg total) by mouth in the morning and at bedtime. (Patient not taking: Reported on 11/10/2022) 180 capsule 0   Insulin Pen Needle (NOVOFINE PEN NEEDLE) 32G X 6 MM MISC 1 Act by Does not apply route once a week. (Patient not taking: Reported on 11/10/2022) 30 each 0   WEGOVY 2.4 MG/0.75ML SOAJ INJECT  THE CONTENTS OF ONE PEN UNDER THE SKIN ONCE WEEKLY ON THE SAME DAY EACH WEEK (Patient not taking: Reported on 11/10/2022) 3 mL 0   No facility-administered medications prior to visit.    ROS Review of Systems  Constitutional: Negative.  Negative for chills, fatigue and fever.  HENT: Negative.    Eyes: Negative.   Respiratory:  Positive for chest tightness.   Cardiovascular:  Negative for chest pain, palpitations and leg swelling.  Gastrointestinal:  Negative for abdominal pain, blood in stool, constipation, diarrhea, nausea and vomiting.  Endocrine: Negative.   Genitourinary: Negative.   Musculoskeletal: Negative.   Skin: Negative.  Negative for rash.  Neurological: Negative.  Negative for dizziness and weakness.  Hematological:  Negative for adenopathy. Does not bruise/bleed easily.  Psychiatric/Behavioral:  Positive for dysphoric mood. Negative for behavioral problems, confusion, decreased concentration, sleep disturbance and suicidal ideas. The patient is nervous/anxious.     Objective:  BP (!) 152/106 (BP Location: Left Arm, Patient Position: Sitting, Cuff Size: Large)   Pulse 95   Temp 98.6 F (37 C) (Oral)   Resp 16   Ht 5\' 7"  (1.702 m)   Wt 178 lb 6.4 oz (80.9 kg)   LMP 10/23/2022 (Exact Date)   SpO2 96%   BMI 27.94 kg/m   BP Readings from Last 3 Encounters:  11/10/22 (!) 152/106  03/03/22 128/82  07/27/21 138/86    Wt Readings from Last 3 Encounters:  11/10/22 178 lb 6.4 oz (80.9 kg)  03/03/22 176 lb (79.8 kg)  07/27/21 206 lb (93.4 kg)    Physical Exam Vitals reviewed.  HENT:     Mouth/Throat:     Mouth: Mucous membranes are moist.  Eyes:     General: No scleral icterus.    Conjunctiva/sclera: Conjunctivae normal.  Cardiovascular:     Rate and Rhythm: Normal rate and regular rhythm.     Heart sounds: Normal heart sounds, S1 normal and S2 normal.     No friction rub. No gallop.     Comments: EKG- NSR, 92 bpm No LVH Diffuse ST/T wave abnormalities   TWI in V2 is new Pulmonary:     Effort: Pulmonary effort is normal.     Breath sounds: No stridor. No wheezing, rhonchi or rales.  Abdominal:     General: Abdomen is flat.     Palpations: There is no mass.     Tenderness: There is no abdominal tenderness. There is no guarding.     Hernia: No hernia is present.  Musculoskeletal:     Cervical back: Neck supple.     Right lower leg: No edema.     Left lower leg: No edema.  Skin:    General: Skin is warm and dry.     Findings: No rash.  Neurological:     General: No focal deficit present.     Mental Status: She is alert. Mental status is at baseline.  Psychiatric:        Attention and Perception: Attention and perception normal.        Mood and Affect: Mood is anxious and depressed. Affect is tearful.        Speech: Speech normal. She is communicative. Speech is not rapid and pressured, delayed or tangential.        Behavior: Behavior normal. Behavior is cooperative.        Thought Content: Thought content normal. Thought content is not paranoid or delusional. Thought content does not include homicidal or suicidal ideation. Thought content does not include homicidal or suicidal plan.        Cognition and Memory: Cognition normal.     Lab Results  Component Value Date   WBC 13.0 (H) 11/10/2022   HGB 11.3 (L) 11/10/2022   HCT 36.1 11/10/2022   PLT 695.0 (H) 11/10/2022   GLUCOSE 91 11/10/2022   CHOL 223 (H) 03/03/2022   TRIG 123.0 03/03/2022   HDL 49.80 03/03/2022   LDLDIRECT 185.0 07/27/2021   LDLCALC 149 (H) 03/03/2022   ALT 26 03/04/2022   AST 30 03/04/2022   NA 139 11/10/2022   K 3.5 11/10/2022   CL 101 11/10/2022   CREATININE 0.77 11/10/2022   BUN 8 11/10/2022   CO2 27 11/10/2022   TSH 2.60 11/10/2022   INR 1.0 02/17/2020    DG Chest 2 View  Result Date: 11/10/2022 CLINICAL DATA:  Chest pain. EXAM: CHEST - 2 VIEW COMPARISON:  Chest radiograph 07/16/2020. FINDINGS: Clear lungs. Stable cardiac and mediastinal  contours. No pleural effusion or pneumothorax. Visualized bones and upper abdomen are unremarkable. IMPRESSION: No evidence of acute cardiopulmonary disease. Electronically Signed   By: Orvan Falconer M.D.   On: 11/10/2022 13:14     Assessment & Plan:   Chest tightness- EKG is abnormal but troponin is normal.  D-dimer and WBC are elevated.  Will evaluate for PE, thoracic aortic aneurysm with dissection, and occult infection. -     DG Chest 2 View; Future -     Brain natriuretic peptide; Future -  D-dimer, quantitative; Future -     Troponin I (High Sensitivity); Future -     EKG 12-Lead -     CT Angio Chest Pulmonary Embolism (PE) W or WO Contrast; Future  Unequal blood pressure in upper extremities- Will evaluate for aortic dissection. -     DG Chest 2 View; Future -     CT Angio Chest Pulmonary Embolism (PE) W or WO Contrast; Future  Thrombocytosis -     Ambulatory referral to Hematology / Oncology  Essential hypertension- Will treat with a diuretic. -     Basic metabolic panel; Future -     Urinalysis, Routine w reflex microscopic; Future -     TSH; Future -     EKG 12-Lead  Iron deficiency anemia due to dietary causes -     CBC with Differential/Platelet; Future -     IBC + Ferritin; Future -     Ambulatory referral to Hematology / Oncology  Moderate episode of recurrent major depressive disorder (HCC) -     Vilazodone HCl; Take 1 tablet (40 mg total) by mouth daily.  Dispense: 90 tablet; Refill: 0  Elevated d-dimer -     CT Angio Chest Pulmonary Embolism (PE) W or WO Contrast; Future     Follow-up: Return if symptoms worsen or fail to improve.  Sanda Linger, MD

## 2022-11-10 NOTE — Patient Instructions (Signed)
Chest Griffey Pain Chest Mccomas pain is pain in or around the bones and muscles of your chest. Sometimes, an injury causes this pain. Excessive coughing or overuse of arm and chest muscles may also cause chest Stiger pain. Sometimes, the cause may not be known. This pain may take several weeks or longer to get better. Follow these instructions at home: Managing pain, stiffness, and swelling  If directed, put ice on the painful area: Put ice in a plastic bag. Place a towel between your skin and the bag. Leave the ice on for 20 minutes, 2-3 times per day. Activity Rest as told by your health care provider. Avoid activities that cause pain. These include any activities that use your chest muscles or your abdominal and side muscles to lift heavy items. Ask your health care provider what activities are safe for you. General instructions  Take over-the-counter and prescription medicines only as told by your health care provider. Do not use any products that contain nicotine or tobacco, such as cigarettes, e-cigarettes, and chewing tobacco. These can delay healing after injury. If you need help quitting, ask your health care provider. Keep all follow-up visits as told by your health care provider. This is important. Contact a health care provider if: You have a fever. Your chest pain becomes worse. You have new symptoms. Get help right away if: You have nausea or vomiting. You feel sweaty or light-headed. You have a cough with mucus from your lungs (sputum) or you cough up blood. You develop shortness of breath. These symptoms may represent a serious problem that is an emergency. Do not wait to see if the symptoms will go away. Get medical help right away. Call your local emergency services (911 in the [place] Do not drive yourself to the hospital. Summary Chest Orea pain is pain in or around the bones and muscles of your chest. Depending on the cause, it may be treated with ice, rest, medicines, and  avoiding activities that cause pain. Contact a health care provider if you have a fever, worsening chest pain, or new symptoms. Get help right away if you feel light-headed or you develop shortness of breath. These symptoms may be an emergency. This information is not intended to replace advice given to you by your health care provider. Make sure you discuss any questions you have with your health care provider. Document Revised: 01/31/2022 Document Reviewed: 01/31/2022 Elsevier Patient Education  2024 [employer].

## 2022-11-10 NOTE — Telephone Encounter (Signed)
Dr. Yetta Barre ordered a CT for the patient and said she needed to get it done ASAP. She would like to know if it can be sent to a location in [place] so she can go sooner. Best callback is [phone].

## 2022-11-10 NOTE — Telephone Encounter (Signed)
What are the options in [facility]?

## 2022-11-11 DIAGNOSIS — R072 Precordial pain: Secondary | ICD-10-CM | POA: Diagnosis not present

## 2022-11-11 DIAGNOSIS — R0789 Other chest pain: Secondary | ICD-10-CM | POA: Diagnosis not present

## 2022-11-11 DIAGNOSIS — R0602 Shortness of breath: Secondary | ICD-10-CM | POA: Diagnosis not present

## 2022-11-11 DIAGNOSIS — R079 Chest pain, unspecified: Secondary | ICD-10-CM | POA: Diagnosis not present

## 2022-11-11 DIAGNOSIS — D649 Anemia, unspecified: Secondary | ICD-10-CM | POA: Diagnosis not present

## 2022-11-11 DIAGNOSIS — R002 Palpitations: Secondary | ICD-10-CM | POA: Diagnosis not present

## 2022-11-11 DIAGNOSIS — R7989 Other specified abnormal findings of blood chemistry: Secondary | ICD-10-CM | POA: Diagnosis not present

## 2022-11-14 ENCOUNTER — Encounter: Payer: Self-pay | Admitting: Radiology

## 2022-11-14 DIAGNOSIS — R9431 Abnormal electrocardiogram [ECG] [EKG]: Secondary | ICD-10-CM | POA: Diagnosis not present

## 2022-11-15 ENCOUNTER — Telehealth: Payer: Self-pay

## 2022-11-15 NOTE — Transitions of Care (Post Inpatient/ED Visit) (Signed)
11/15/2022  Name: Jennifer Mcknight MRN: [id number] DOB: 1970/06/06  Today's TOC FU Call Status: Today's TOC FU Call Status:: Successful TOC FU Call Completed TOC FU Call Complete Date: 11/15/22 Patient's Name and Date of Birth confirmed.  Transition Care Management Follow-up Telephone Call Date of Discharge: 11/11/22 Discharge Facility: Other [facility]) Name of Other (Non[facility]) Discharge Facility: [facility] - EMERGENCY DEPARTMENT Type of Discharge: Emergency Department Reason for ED Visit: Cardiac Conditions Cardiac Conditions Diagnosis:  (Chest pain and palpitations) How have you been since you were released from the hospital?: Same Any questions or concerns?: Yes Patient Questions/Concerns:: What are we going to do about her anxiety. Patient Questions/Concerns Addressed: Notified Provider of Patient Questions/Concerns  Items Reviewed: Did you receive and understand the discharge instructions provided?: Yes (Patient was sent home with a heart monitor.) Medications obtained,verified, and reconciled?: Yes (Medications Reviewed) Any new allergies since your discharge?: No Dietary orders reviewed?: NA Do you have support at home?: No  Medications Reviewed Today: Medications Reviewed Today   Medications were not reviewed in this encounter     Home Care and Equipment/Supplies: Were Home Health Services Ordered?: No Any new equipment or medical supplies ordered?: No (Patient was sent home with a heart monitor.)  Functional Questionnaire: Do you need assistance with bathing/showering or dressing?: No Do you need assistance with meal preparation?: No Do you need assistance with eating?: No Do you have difficulty maintaining continence: No Do you need assistance with getting out of bed/getting out of a chair/moving?: No Do you have difficulty managing or taking your medications?: No  Follow up appointments reviewed: PCP  Follow-up appointment confirmed?: Yes Date of PCP follow-up appointment?: 11/21/22 Follow-up Provider: Dr. Yetta Barre Specialist [facility] Follow-up appointment confirmed?: No Do you need transportation to your follow-up appointment?: No Do you understand care options if your condition(s) worsen?: Yes-patient verbalized understanding  Patient will follow up with her cardiologist per her My chart message from 11/14/2022    SIGNATURE: Reather Laurence, RMA

## 2022-11-17 ENCOUNTER — Other Ambulatory Visit: Payer: BC Managed Care – PPO

## 2022-11-17 ENCOUNTER — Encounter: Payer: BC Managed Care – PPO | Admitting: Hematology and Oncology

## 2022-11-21 ENCOUNTER — Inpatient Hospital Stay: Payer: BC Managed Care – PPO | Admitting: Internal Medicine

## 2022-11-29 ENCOUNTER — Telehealth: Payer: Self-pay | Admitting: Oncology

## 2022-11-29 NOTE — Telephone Encounter (Signed)
Rescheduled appointments per provider on-call. Patient is aware of the changes made to her upcoming appointments.

## 2022-12-02 DIAGNOSIS — I493 Ventricular premature depolarization: Secondary | ICD-10-CM | POA: Diagnosis not present

## 2022-12-02 DIAGNOSIS — R002 Palpitations: Secondary | ICD-10-CM | POA: Diagnosis not present

## 2022-12-05 ENCOUNTER — Inpatient Hospital Stay: Payer: BC Managed Care – PPO

## 2022-12-05 ENCOUNTER — Telehealth: Payer: Self-pay | Admitting: Internal Medicine

## 2022-12-05 ENCOUNTER — Telehealth: Payer: Self-pay | Admitting: Oncology

## 2022-12-05 NOTE — Telephone Encounter (Signed)
Pt refused appointment for referral at the cancer center referral #  [phone]  pt declined so the cancer center is now closing the referral and wanted to notify PCP.

## 2022-12-05 NOTE — Telephone Encounter (Signed)
Called referring office spoke with [facility] advised closing referral per patient request.

## 2022-12-05 NOTE — Telephone Encounter (Signed)
Rescheduled appointment per provider. Patient asked to cancel appointments and wishes not to reschedule at this time. Referring office will be notified.

## 2022-12-06 ENCOUNTER — Other Ambulatory Visit: Payer: Self-pay | Admitting: Internal Medicine

## 2022-12-06 DIAGNOSIS — F331 Major depressive disorder, recurrent, moderate: Secondary | ICD-10-CM

## 2022-12-07 ENCOUNTER — Other Ambulatory Visit: Payer: BC Managed Care – PPO

## 2022-12-07 ENCOUNTER — Inpatient Hospital Stay: Payer: BC Managed Care – PPO | Admitting: Oncology

## 2022-12-07 NOTE — Telephone Encounter (Signed)
Patient wants to know if you will fill until patient comes in on the 31st.  Patient did not refuse appointment with cancer center until the 3rd time that they cancelled because provider was unavailable and she could not make it the last time because she is out of town until the 30th of October.

## 2022-12-22 ENCOUNTER — Inpatient Hospital Stay: Payer: BC Managed Care – PPO | Admitting: Internal Medicine

## 2022-12-26 ENCOUNTER — Inpatient Hospital Stay: Payer: BC Managed Care – PPO | Admitting: Internal Medicine

## 2023-01-16 ENCOUNTER — Encounter: Payer: Self-pay | Admitting: Internal Medicine

## 2023-01-16 ENCOUNTER — Ambulatory Visit: Payer: BC Managed Care – PPO | Admitting: Internal Medicine

## 2023-01-16 VITALS — BP 130/86 | HR 73 | Temp 98.0°F | Ht 67.0 in | Wt 198.0 lb

## 2023-01-16 DIAGNOSIS — I10 Essential (primary) hypertension: Secondary | ICD-10-CM | POA: Diagnosis not present

## 2023-01-16 DIAGNOSIS — Z0001 Encounter for general adult medical examination with abnormal findings: Secondary | ICD-10-CM | POA: Diagnosis not present

## 2023-01-16 DIAGNOSIS — E6609 Other obesity due to excess calories: Secondary | ICD-10-CM | POA: Diagnosis not present

## 2023-01-16 DIAGNOSIS — D75839 Thrombocytosis, unspecified: Secondary | ICD-10-CM

## 2023-01-16 DIAGNOSIS — E66811 Obesity, class 1: Secondary | ICD-10-CM

## 2023-01-16 DIAGNOSIS — Z6830 Body mass index (BMI) 30.0-30.9, adult: Secondary | ICD-10-CM

## 2023-01-16 DIAGNOSIS — Z1231 Encounter for screening mammogram for malignant neoplasm of breast: Secondary | ICD-10-CM

## 2023-01-16 DIAGNOSIS — D508 Other iron deficiency anemias: Secondary | ICD-10-CM | POA: Diagnosis not present

## 2023-01-16 DIAGNOSIS — E785 Hyperlipidemia, unspecified: Secondary | ICD-10-CM

## 2023-01-16 DIAGNOSIS — F331 Major depressive disorder, recurrent, moderate: Secondary | ICD-10-CM

## 2023-01-16 DIAGNOSIS — Z23 Encounter for immunization: Secondary | ICD-10-CM | POA: Diagnosis not present

## 2023-01-16 LAB — HEMOGLOBIN A1C: Hgb A1c MFr Bld: 6 % (ref 4.6–6.5)

## 2023-01-16 MED ORDER — VILAZODONE HCL 40 MG PO TABS: 40.0000 mg | ORAL_TABLET | Freq: Every day | ORAL | 0 refills | Status: DC

## 2023-01-16 NOTE — Patient Instructions (Addendum)
PLEASE COME BACK IN 4-6 MONTHS FOR THE SECOND SHINGLES VACCINE   Health Maintenance, Female Adopting a healthy lifestyle and getting preventive care are important in promoting health and wellness. Ask your health care provider about: The right schedule for you to have regular tests and exams. Things you can do on your own to prevent diseases and keep yourself healthy. What should I know about diet, weight, and exercise? Eat a healthy diet  Eat a diet that includes plenty of vegetables, fruits, low-fat dairy products, and lean protein. Do not eat a lot of foods that are high in solid fats, added sugars, or sodium. Maintain a healthy weight Body mass index (BMI) is used to identify weight problems. It estimates body fat based on height and weight. Your health care provider can help determine your BMI and help you achieve or maintain a healthy weight. Get regular exercise Get regular exercise. This is one of the most important things you can do for your health. Most adults should: Exercise for at least 150 minutes each week. The exercise should increase your heart rate and make you sweat (moderate-intensity exercise). Do strengthening exercises at least twice a week. This is in addition to the moderate-intensity exercise. Spend less time sitting. Even light physical activity can be beneficial. Watch cholesterol and blood lipids Have your blood tested for lipids and cholesterol at 51 years of age, then have this test every 5 years. Have your cholesterol levels checked more often if: Your lipid or cholesterol levels are high. You are older than 51 years of age. You are at high risk for heart disease. What should I know about cancer screening? Depending on your health history and family history, you may need to have cancer screening at various ages. This may include screening for: Breast cancer. Cervical cancer. Colorectal cancer. Skin cancer. Lung cancer. What should I know about heart  disease, diabetes, and high blood pressure? Blood pressure and heart disease High blood pressure causes heart disease and increases the risk of stroke. This is more likely to develop in people who have high blood pressure readings or are overweight. Have your blood pressure checked: Every 3-5 years if you are 27-64 years of age. Every year if you are 12 years old or older. Diabetes Have regular diabetes screenings. This checks your fasting blood sugar level. Have the screening done: Once every three years after age 4 if you are at a normal weight and have a low risk for diabetes. More often and at a younger age if you are overweight or have a high risk for diabetes. What should I know about preventing infection? Hepatitis B If you have a higher risk for hepatitis B, you should be screened for this virus. Talk with your health care provider to find out if you are at risk for hepatitis B infection. Hepatitis C Testing is recommended for: Everyone born from [birth year] through 1964. Anyone with known risk factors for hepatitis C. Sexually transmitted infections (STIs) Get screened for STIs, including gonorrhea and chlamydia, if: You are sexually active and are younger than 51 years of age. You are older than 51 years of age and your health care provider tells you that you are at risk for this type of infection. Your sexual activity has changed since you were last screened, and you are at increased risk for chlamydia or gonorrhea. Ask your health care provider if you are at risk. Ask your health care provider about whether you are at high risk for HIV.  Your health care provider may recommend a prescription medicine to help prevent HIV infection. If you choose to take medicine to prevent HIV, you should first get tested for HIV. You should then be tested every 3 months for as long as you are taking the medicine. Pregnancy If you are about to stop having your period (premenopausal) and you may become  pregnant, seek counseling before you get pregnant. Take 400 to 800 micrograms (mcg) of folic acid every day if you become pregnant. Ask for birth control (contraception) if you want to prevent pregnancy. Osteoporosis and menopause Osteoporosis is a disease in which the bones lose minerals and strength with aging. This can result in bone fractures. If you are 48 years old or older, or if you are at risk for osteoporosis and fractures, ask your health care provider if you should: Be screened for bone loss. Take a calcium or vitamin D supplement to lower your risk of fractures. Be given hormone replacement therapy (HRT) to treat symptoms of menopause. Follow these instructions at home: Alcohol use Do not drink alcohol if: Your health care provider tells you not to drink. You are pregnant, may be pregnant, or are planning to become pregnant. If you drink alcohol: Limit how much you have to: 0-1 drink a day. Know how much alcohol is in your drink. In the [place] one drink equals one 12 oz bottle of beer (355 mL), one 5 oz glass of wine (148 mL), or one 1 oz glass of hard liquor (44 mL). Lifestyle Do not use any products that contain nicotine or tobacco. These products include cigarettes, chewing tobacco, and vaping devices, such as e-cigarettes. If you need help quitting, ask your health care provider. Do not use street drugs. Do not share needles. Ask your health care provider for help if you need support or information about quitting drugs. General instructions Schedule regular health, dental, and eye exams. Stay current with your vaccines. Tell your health care provider if: You often feel depressed. You have ever been abused or do not feel safe at home. Summary Adopting a healthy lifestyle and getting preventive care are important in promoting health and wellness. Follow your health care provider's instructions about healthy diet, exercising, and getting tested or screened for  diseases. Follow your health care provider's instructions on monitoring your cholesterol and blood pressure. This information is not intended to replace advice given to you by your health care provider. Make sure you discuss any questions you have with your health care provider. Document Revised: 06/29/2020 Document Reviewed: 06/29/2020 Elsevier Patient Education  2024 [employer].

## 2023-01-16 NOTE — Progress Notes (Unsigned)
Subjective:  Patient ID: Jennifer Mcknight, female    DOB: Jun 10, 1970  Age: 52 y.o. MRN: [id number]  CC: Anemia and Annual Exam   HPI Jennifer Mcknight presents for a CPX and f/up ----  Discussed the use of AI scribe software for clinical note transcription with the patient, who gave verbal consent to proceed.  History of Present Illness   The patient, with a history of anxiety and cardiac issues, reports an improvement in her condition since discontinuing Fetzema. She continues to experience episodes of anxiety. The anxiety does not escalate to panic or a sense of impending doom. She also reports occasional palpitations and shortness of breath, which she attributes to recent weight gain. She has not followed up on a cardiology workup involving a Zio patch, but no issues were reported to her.  The patient has gained significant weight in the past two months, which she attributes to discontinuing Wegovy due to cost. She expresses interest in alternatives to [facility].  The patient is due for several vaccines, including shingles and tetanus, and has upcoming appointments for a Pap smear and mammogram.       Outpatient Medications Prior to Visit  Medication Sig Dispense Refill   indapamide (LOZOL) 1.25 MG tablet Take 1 tablet (1.25 mg total) by mouth daily. 90 tablet 0   Multiple Vitamin (MULTIVITAMIN WITH MINERALS) TABS tablet Take 1 tablet by mouth daily.     FETZIMA 20 MG CP24 TAKE ONE CAPSULE BY MOUTH ONE TIME DAILY 90 capsule 0   Vilazodone HCl (VIIBRYD) 40 MG TABS Take 1 tablet (40 mg total) by mouth daily. (Patient not taking: Reported on 01/16/2023) 90 tablet 0   No facility-administered medications prior to visit.    ROS Review of Systems  Objective:  BP 130/86 (BP Location: Right Arm, Patient Position: Sitting, Cuff Size: Normal)   Pulse 73   Temp 98 F (36.7 C) (Oral)   Ht 5\' 7"  (1.702 m)   Wt 198 lb (89.8 kg)   LMP 01/09/2023   SpO2 97%   BMI 31.01 kg/m   BP Readings from  Last 3 Encounters:  01/16/23 130/86  11/10/22 (!) 152/106  03/03/22 128/82    Wt Readings from Last 3 Encounters:  01/16/23 198 lb (89.8 kg)  11/10/22 178 lb 6.4 oz (80.9 kg)  03/03/22 176 lb (79.8 kg)    Physical Exam  Lab Results  Component Value Date   WBC 14.7 (H) 01/16/2023   HGB 10.2 (L) 01/16/2023   HCT 32.5 (L) 01/16/2023   PLT 647.0 (H) 01/16/2023   GLUCOSE 94 01/16/2023   CHOL 275 (H) 01/16/2023   TRIG 238.0 (H) 01/16/2023   HDL 58.70 01/16/2023   LDLDIRECT 185.0 07/27/2021   LDLCALC 169 (H) 01/16/2023   ALT 32 01/16/2023   AST 34 01/16/2023   NA 137 01/16/2023   K 3.7 01/16/2023   CL 99 01/16/2023   CREATININE 0.74 01/16/2023   BUN 12 01/16/2023   CO2 27 01/16/2023   TSH 2.60 11/10/2022   INR 1.0 02/17/2020   HGBA1C 6.0 01/16/2023    No results found.  Assessment & Plan:  Need for prophylactic vaccination and inoculation against varicella -     Varicella-zoster vaccine IM  Need for prophylactic vaccination with combined diphtheria-tetanus-pertussis (DTP) vaccine -     Tdap vaccine greater than or equal to 7yo IM  Encounter for general adult medical examination with abnormal findings  Thrombocytosis -     CBC with Differential/Platelet; Future -  IBC + Ferritin; Future -     Hepatic function panel; Future -     Ambulatory referral to Hematology / Oncology  Iron deficiency anemia due to dietary causes -     CBC with Differential/Platelet; Future -     IBC + Ferritin; Future -     Ambulatory referral to Hematology / Oncology -     Hemoccult Cards (X3 cards); Future  Class 1 obesity due to excess calories without serious comorbidity with body mass index (BMI) of 30.0 to 30.9 in adult -     Hemoglobin A1c; Future  Dyslipidemia, goal LDL below 130 -     Lipid panel; Future  Primary hypertension -     Basic metabolic panel; Future  Screening mammogram for breast cancer  Moderate episode of recurrent major depressive disorder (HCC) -      Vilazodone HCl; Take 1 tablet (40 mg total) by mouth daily.  Dispense: 90 tablet; Refill: 0     Follow-up: Return in about 6 months (around 07/16/2023).  Sanda Linger, MD

## 2023-01-17 LAB — CBC WITH DIFFERENTIAL/PLATELET
Basophils Absolute: 0.1 10*3/uL (ref 0.0–0.1)
Basophils Relative: 0.7 % (ref 0.0–3.0)
Eosinophils Absolute: 0.8 10*3/uL — ABNORMAL HIGH (ref 0.0–0.7)
Eosinophils Relative: 5.5 % — ABNORMAL HIGH (ref 0.0–5.0)
HCT: 32.5 % — ABNORMAL LOW (ref 36.0–46.0)
Hemoglobin: 10.2 g/dL — ABNORMAL LOW (ref 12.0–15.0)
Lymphocytes Relative: 29.3 % (ref 12.0–46.0)
Lymphs Abs: 4.3 10*3/uL — ABNORMAL HIGH (ref 0.7–4.0)
MCHC: 31.4 g/dL (ref 30.0–36.0)
MCV: 72.3 fL — ABNORMAL LOW (ref 78.0–100.0)
Monocytes Absolute: 0.7 10*3/uL (ref 0.1–1.0)
Monocytes Relative: 4.9 % (ref 3.0–12.0)
Neutro Abs: 8.8 10*3/uL — ABNORMAL HIGH (ref 1.4–7.7)
Neutrophils Relative %: 59.6 % (ref 43.0–77.0)
Platelets: 647 10*3/uL — ABNORMAL HIGH (ref 150.0–400.0)
RBC: 4.5 Mil/uL (ref 3.87–5.11)
RDW: 16.2 % — ABNORMAL HIGH (ref 11.5–15.5)
WBC: 14.7 10*3/uL — ABNORMAL HIGH (ref 4.0–10.5)

## 2023-01-17 LAB — BASIC METABOLIC PANEL
BUN: 12 mg/dL (ref 6–23)
CO2: 27 meq/L (ref 19–32)
Calcium: 9.4 mg/dL (ref 8.4–10.5)
Chloride: 99 meq/L (ref 96–112)
Creatinine, Ser: 0.74 mg/dL (ref 0.40–1.20)
GFR: 93.89 mL/min (ref >60.00)
Glucose, Bld: 94 mg/dL (ref 70–99)
Potassium: 3.7 meq/L (ref 3.5–5.1)
Sodium: 137 meq/L (ref 135–145)

## 2023-01-17 LAB — IBC + FERRITIN
Ferritin: 4.7 ng/mL — ABNORMAL LOW (ref 10.0–291.0)
Iron: 15 ug/dL — ABNORMAL LOW (ref 42–145)
Saturation Ratios: 2.5 % — ABNORMAL LOW (ref 20.0–50.0)
TIBC: 589.4 ug/dL — ABNORMAL HIGH (ref 250.0–450.0)
Transferrin: 421 mg/dL — ABNORMAL HIGH (ref 212.0–360.0)

## 2023-01-17 LAB — LIPID PANEL
Cholesterol: 275 mg/dL — ABNORMAL HIGH (ref 0–200)
HDL: 58.7 mg/dL (ref >39.00)
LDL Cholesterol: 169 mg/dL — ABNORMAL HIGH (ref 0–99)
NonHDL: 216.36
Total CHOL/HDL Ratio: 5
Triglycerides: 238 mg/dL — ABNORMAL HIGH (ref 0.0–149.0)
VLDL: 47.6 mg/dL — ABNORMAL HIGH (ref 0.0–40.0)

## 2023-01-17 LAB — HEPATIC FUNCTION PANEL
ALT: 32 U/L (ref 0–35)
AST: 34 U/L (ref 0–37)
Albumin: 4.4 g/dL (ref 3.5–5.2)
Alkaline Phosphatase: 68 U/L (ref 39–117)
Bilirubin, Direct: 0 mg/dL (ref 0.0–0.3)
Total Bilirubin: 0.3 mg/dL (ref 0.2–1.2)
Total Protein: 7.8 g/dL (ref 6.0–8.3)

## 2023-01-24 ENCOUNTER — Encounter: Payer: Self-pay | Admitting: Internal Medicine

## 2023-01-24 ENCOUNTER — Ambulatory Visit: Payer: BC Managed Care – PPO | Admitting: Internal Medicine

## 2023-01-25 ENCOUNTER — Other Ambulatory Visit: Payer: Self-pay | Admitting: Internal Medicine

## 2023-01-25 DIAGNOSIS — R0681 Apnea, not elsewhere classified: Secondary | ICD-10-CM | POA: Insufficient documentation

## 2023-02-07 ENCOUNTER — Ambulatory Visit (INDEPENDENT_AMBULATORY_CARE_PROVIDER_SITE_OTHER): Payer: BC Managed Care – PPO

## 2023-02-07 ENCOUNTER — Encounter: Payer: Self-pay | Admitting: Internal Medicine

## 2023-02-07 DIAGNOSIS — D508 Other iron deficiency anemias: Secondary | ICD-10-CM

## 2023-02-07 LAB — HEMOCCULT SLIDES (X 3 CARDS)
Fecal Occult Blood: NEGATIVE
OCCULT 1: NEGATIVE
OCCULT 2: NEGATIVE
OCCULT 3: NEGATIVE
OCCULT 4: NEGATIVE
OCCULT 5: NEGATIVE

## 2023-02-17 ENCOUNTER — Inpatient Hospital Stay: Payer: BC Managed Care – PPO | Admitting: Oncology

## 2023-02-17 ENCOUNTER — Ambulatory Visit: Payer: BC Managed Care – PPO

## 2023-02-17 ENCOUNTER — Inpatient Hospital Stay: Payer: BC Managed Care – PPO

## 2023-03-27 DIAGNOSIS — R3915 Urgency of urination: Secondary | ICD-10-CM | POA: Diagnosis not present

## 2023-03-27 DIAGNOSIS — Z1330 Encounter for screening examination for mental health and behavioral disorders, unspecified: Secondary | ICD-10-CM | POA: Diagnosis not present

## 2023-03-27 DIAGNOSIS — Z1151 Encounter for screening for human papillomavirus (HPV): Secondary | ICD-10-CM | POA: Diagnosis not present

## 2023-03-27 DIAGNOSIS — N939 Abnormal uterine and vaginal bleeding, unspecified: Secondary | ICD-10-CM | POA: Diagnosis not present

## 2023-03-27 DIAGNOSIS — Z01419 Encounter for gynecological examination (general) (routine) without abnormal findings: Secondary | ICD-10-CM | POA: Diagnosis not present

## 2023-03-28 ENCOUNTER — Telehealth: Payer: Self-pay | Admitting: Oncology

## 2023-03-28 DIAGNOSIS — Z1151 Encounter for screening for human papillomavirus (HPV): Secondary | ICD-10-CM | POA: Diagnosis not present

## 2023-03-28 DIAGNOSIS — Z124 Encounter for screening for malignant neoplasm of cervix: Secondary | ICD-10-CM | POA: Diagnosis not present

## 2023-03-28 NOTE — Telephone Encounter (Signed)
Rescheduled appointments per patient request. Patient is aware of the made appointments and is active on MyChart.

## 2023-04-04 DIAGNOSIS — R87611 Atypical squamous cells cannot exclude high grade squamous intraepithelial lesion on cytologic smear of cervix (ASC-H): Secondary | ICD-10-CM | POA: Insufficient documentation

## 2023-04-05 ENCOUNTER — Ambulatory Visit
Admission: RE | Admit: 2023-04-05 | Discharge: 2023-04-05 | Disposition: A | Discharge status: Home / Self Care | Payer: BC Managed Care – PPO | Source: Ambulatory Visit | Attending: Internal Medicine | Admitting: Internal Medicine

## 2023-04-05 DIAGNOSIS — Z1231 Encounter for screening mammogram for malignant neoplasm of breast: Secondary | ICD-10-CM | POA: Diagnosis not present

## 2023-04-11 LAB — HM PAP SMEAR

## 2023-04-13 ENCOUNTER — Other Ambulatory Visit: Payer: Self-pay | Admitting: Oncology

## 2023-04-13 ENCOUNTER — Encounter: Payer: Self-pay | Admitting: Oncology

## 2023-04-13 ENCOUNTER — Inpatient Hospital Stay: Payer: BC Managed Care – PPO | Attending: Oncology | Admitting: Oncology

## 2023-04-13 ENCOUNTER — Encounter: Payer: Self-pay | Admitting: Internal Medicine

## 2023-04-13 ENCOUNTER — Inpatient Hospital Stay: Payer: BC Managed Care – PPO

## 2023-04-13 VITALS — BP 156/99 | HR 88 | Temp 97.6°F | Resp 16 | Ht 67.0 in | Wt 211.6 lb

## 2023-04-13 DIAGNOSIS — D75839 Thrombocytosis, unspecified: Secondary | ICD-10-CM | POA: Insufficient documentation

## 2023-04-13 DIAGNOSIS — Z793 Long term (current) use of hormonal contraceptives: Secondary | ICD-10-CM | POA: Insufficient documentation

## 2023-04-13 DIAGNOSIS — G4730 Sleep apnea, unspecified: Secondary | ICD-10-CM | POA: Insufficient documentation

## 2023-04-13 DIAGNOSIS — R9230 Dense breasts, unspecified: Secondary | ICD-10-CM | POA: Insufficient documentation

## 2023-04-13 DIAGNOSIS — Z79899 Other long term (current) drug therapy: Secondary | ICD-10-CM | POA: Diagnosis not present

## 2023-04-13 DIAGNOSIS — D509 Iron deficiency anemia, unspecified: Secondary | ICD-10-CM | POA: Diagnosis not present

## 2023-04-13 DIAGNOSIS — Z811 Family history of alcohol abuse and dependence: Secondary | ICD-10-CM | POA: Insufficient documentation

## 2023-04-13 DIAGNOSIS — Z833 Family history of diabetes mellitus: Secondary | ICD-10-CM | POA: Insufficient documentation

## 2023-04-13 DIAGNOSIS — D72820 Lymphocytosis (symptomatic): Secondary | ICD-10-CM | POA: Insufficient documentation

## 2023-04-13 DIAGNOSIS — Z888 Allergy status to other drugs, medicaments and biological substances status: Secondary | ICD-10-CM | POA: Diagnosis not present

## 2023-04-13 DIAGNOSIS — N920 Excessive and frequent menstruation with regular cycle: Secondary | ICD-10-CM | POA: Insufficient documentation

## 2023-04-13 DIAGNOSIS — R030 Elevated blood-pressure reading, without diagnosis of hypertension: Secondary | ICD-10-CM | POA: Insufficient documentation

## 2023-04-13 DIAGNOSIS — Z814 Family history of other substance abuse and dependence: Secondary | ICD-10-CM | POA: Diagnosis not present

## 2023-04-13 DIAGNOSIS — D72829 Elevated white blood cell count, unspecified: Secondary | ICD-10-CM | POA: Diagnosis not present

## 2023-04-13 DIAGNOSIS — Z9049 Acquired absence of other specified parts of digestive tract: Secondary | ICD-10-CM | POA: Diagnosis not present

## 2023-04-13 DIAGNOSIS — D500 Iron deficiency anemia secondary to blood loss (chronic): Secondary | ICD-10-CM | POA: Diagnosis not present

## 2023-04-13 DIAGNOSIS — Z0000 Encounter for general adult medical examination without abnormal findings: Secondary | ICD-10-CM | POA: Insufficient documentation

## 2023-04-13 DIAGNOSIS — Z818 Family history of other mental and behavioral disorders: Secondary | ICD-10-CM | POA: Diagnosis not present

## 2023-04-13 LAB — CMP (CANCER CENTER ONLY)
ALT: 24 U/L (ref 0–44)
AST: 24 U/L (ref 15–41)
Albumin: 4.7 g/dL (ref 3.5–5.0)
Alkaline Phosphatase: 58 U/L (ref 38–126)
Anion gap: 8 (ref 5–15)
BUN: 14 mg/dL (ref 6–20)
CO2: 25 mmol/L (ref 22–32)
Calcium: 9.6 mg/dL (ref 8.9–10.3)
Chloride: 106 mmol/L (ref 98–111)
Creatinine: 0.72 mg/dL (ref 0.44–1.00)
GFR, Estimated: >60 mL/min (ref >60)
Glucose, Bld: 86 mg/dL (ref 70–99)
Potassium: 4 mmol/L (ref 3.5–5.1)
Sodium: 139 mmol/L (ref 135–145)
Total Bilirubin: 0.3 mg/dL (ref 0.0–1.2)
Total Protein: 7.9 g/dL (ref 6.5–8.1)

## 2023-04-13 LAB — CBC WITH DIFFERENTIAL (CANCER CENTER ONLY)
Abs Immature Granulocytes: 0.03 10*3/uL (ref 0.00–0.07)
Basophils Absolute: 0.1 10*3/uL (ref 0.0–0.1)
Basophils Relative: 1 %
Eosinophils Absolute: 0.6 10*3/uL — ABNORMAL HIGH (ref 0.0–0.5)
Eosinophils Relative: 5 %
HCT: 35 % — ABNORMAL LOW (ref 36.0–46.0)
Hemoglobin: 10.8 g/dL — ABNORMAL LOW (ref 12.0–15.0)
Immature Granulocytes: 0 %
Lymphocytes Relative: 41 %
Lymphs Abs: 5 10*3/uL — ABNORMAL HIGH (ref 0.7–4.0)
MCH: 21.3 pg — ABNORMAL LOW (ref 26.0–34.0)
MCHC: 30.9 g/dL (ref 30.0–36.0)
MCV: 69.2 fL — ABNORMAL LOW (ref 80.0–100.0)
Monocytes Absolute: 0.9 10*3/uL (ref 0.1–1.0)
Monocytes Relative: 7 %
Neutro Abs: 5.7 10*3/uL (ref 1.7–7.7)
Neutrophils Relative %: 46 %
Platelet Count: 683 10*3/uL — ABNORMAL HIGH (ref 150–400)
RBC: 5.06 MIL/uL (ref 3.87–5.11)
RDW: 17.2 % — ABNORMAL HIGH (ref 11.5–15.5)
Smear Review: NORMAL
WBC Count: 12.4 10*3/uL — ABNORMAL HIGH (ref 4.0–10.5)
nRBC: 0 % (ref 0.0–0.2)

## 2023-04-13 LAB — LACTATE DEHYDROGENASE: LDH: 190 U/L (ref 98–192)

## 2023-04-13 LAB — IRON AND IRON BINDING CAPACITY (CC-WL,HP ONLY)
Iron: 24 ug/dL — ABNORMAL LOW (ref 28–170)
Saturation Ratios: 4 % — ABNORMAL LOW (ref 10.4–31.8)
TIBC: 633 ug/dL — ABNORMAL HIGH (ref 250–450)
UIBC: 609 ug/dL — ABNORMAL HIGH (ref 148–442)

## 2023-04-13 NOTE — Assessment & Plan Note (Addendum)
Chronic thrombocytosis with a history of high platelet count. Previous workup in 2020 showed no bone marrow mutations, cancers, or leukemias.   Current high platelet count may be related to iron deficiency due to menorrhagia.  Labs today reveal persistent thrombocytosis with platelet count of 683,000.  Hemoglobin 10.8, MCV 69.  White count 12,400 with mild lymphocytosis of 5000.  Discussed that IV iron infusion can help normalize platelet count and improve symptoms.  We will arrange for IV iron at our infusion center on [address] pending insurance authorization.  Plan for Feraheme x 2 doses.  - Schedule follow-up phone call in two weeks to review results - Schedule in-person follow-up in three months

## 2023-04-13 NOTE — Assessment & Plan Note (Signed)
Completed recent mammogram and Cologuard test for colon cancer screening, both normal.

## 2023-04-13 NOTE — Assessment & Plan Note (Signed)
Elevated blood pressure noted at 156/99 mmHg. Reports white coat syndrome but does not monitor blood pressure at home. Discussed the importance of home blood pressure monitoring to better manage hypertension.

## 2023-04-13 NOTE — Assessment & Plan Note (Signed)
-  Chronic intermittent leukocytosis.  Will obtain flow cytometry of peripheral blood and BCR/ABL 1 testing for further analysis.  Patient does have mild lymphocytosis today.

## 2023-04-13 NOTE — Progress Notes (Signed)
[facility]  HEMATOLOGY CLINIC CONSULTATION NOTE  PATIENT NAME: Jennifer Mcknight   MR#: [id number] DOB: [birth year]  DATE OF SERVICE: 04/13/2023  Patient Care Team: Etta Grandchild, MD as PCP - General  REASON FOR CONSULTATION/ CHIEF COMPLAINT:  Thrombocytosis  ASSESSMENT & PLAN:  Jennifer Mcknight is a 53 y.o. lady with past medical history of menorrhagia, sleep apnea, was referred to our service for evaluation of thrombocytosis.  Thrombocytosis Chronic thrombocytosis with a history of high platelet count. Previous workup in 2020 showed no bone marrow mutations, cancers, or leukemias.   Current high platelet count may be related to iron deficiency due to menorrhagia.  Labs today reveal persistent thrombocytosis with platelet count of 683,000.  Hemoglobin 10.8, MCV 69.  White count 12,400 with mild lymphocytosis of 5000.  Discussed that IV iron infusion can help normalize platelet count and improve symptoms.  We will arrange for IV iron at our infusion center on [address] pending insurance authorization.  Plan for Feraheme x 2 doses.  - Schedule follow-up phone call in two weeks to review results - Schedule in-person follow-up in three months  Iron deficiency anemia due to chronic blood loss Iron deficiency anemia likely secondary to menorrhagia. Reports heavy cycles every 28 days. Iron levels are low, contributing to high platelet count. Discussed that IV iron infusion can help improve iron levels and reduce platelet count. Explained that iron infusion should last at least three months, after which re-evaluation will be done.  Hemoglobin 10.8, MCV 69 today.  Iron studies indicate iron deficiency.  We will arrange IV iron with Feraheme weekly x 2 doses.  - Coordinate with gynecologist for management of menorrhagia  Elevated blood pressure reading Elevated blood pressure noted at 156/99 mmHg. Reports white coat syndrome but does not monitor blood pressure at home.  Discussed the importance of home blood pressure monitoring to better manage hypertension.  Healthcare maintenance Completed recent mammogram and Cologuard test for colon cancer screening, both normal.  Leukocytosis -Chronic intermittent leukocytosis.  Will obtain flow cytometry of peripheral blood and BCR/ABL 1 testing for further analysis.  Patient does have mild lymphocytosis today.   I reviewed lab results and outside records for this visit and discussed relevant results with the patient. Diagnosis, plan of care and treatment options were also discussed in detail with the patient. Opportunity provided to ask questions and answers provided to her apparent satisfaction. Provided instructions to call our clinic with any problems, questions or concerns prior to return visit. I recommended to continue follow-up with PCP and sub-specialists. She verbalized understanding and agreed with the plan. No barriers to learning was detected.  Meryl Crutch, MD  04/13/2023 4:02 PM  [facility] - A DE[facility] [address] [place] [address] Dept: [phone] Dept Fax: [phone]    HISTORY OF PRESENTING ILLNESS:   Discussed the use of AI scribe software for clinical note transcription with the patient, who gave verbal consent to proceed.   On her follow-up visit with her PCP Dr. Yetta Barre on 01/16/2023, she was found to have elevated platelet count at 647,000.  Previously her platelet count was 695,000 in September 2024.  Given persistent thrombocytosis, referral was sent to [place] for further evaluation.  She was also found to have mild leukocytosis with white count of 14,700, anemia with hemoglobin of 10.2 in November 2024.  She reports recent rapid weight gain and heavy menstrual  cycles, which occur every 28 days. The patient is currently seeing a gynecologist for these issues and has been prescribed progesterone to manage the  heavy bleeding. The patient also mentions a potential growth in the uterus, which is currently being investigated by her gynecologist.  In November 2020, the patient underwent a gallbladder removal surgery due to gallstones, which she does not recall. The patient also has sleep apnea and is due for a sleep study.  She denies recent infection. Denies any sinus congestion, cough, urinary frequency/urgency or dysuria, diarrhea, joint swelling/pain or abnormal skin rash.   She did not have prior splenectomy  She had no prior history or diagnosis of cancer. Her age appropriate screening programs are up-to-date.  Patient has never been diagnosed with thrombotic events.  MEDICAL HISTORY:  Past Medical History:  Diagnosis Date   Depression    Elevated liver enzymes    Family history of adverse reaction to anesthesia    mother had PONV    Headache    rarely   PONV (postoperative nausea and vomiting)    Sleep apnea    no CPAP, per patient unable to tolerate    SURGICAL HISTORY: Past Surgical History:  Procedure Laterality Date   BREAST SURGERY     CESAREAN SECTION     CHOLECYSTECTOMY N/A 12/31/2018   Procedure: LAPAROSCOPIC CHOLECYSTECTOMY;  Surgeon: Axel Filler, MD;  Location: [facility] OR;  Service: General;  Laterality: N/A;   MASTOPEXY     NASAL SINUS SURGERY     REDUCTION MAMMAPLASTY Bilateral    Breast Lift   TUBAL LIGATION      SOCIAL HISTORY: Social History   Socioeconomic History   Marital status: Married    Spouse name: Not on file   Number of children: Not on file   Years of education: Not on file   Highest education level: Not on file  Occupational History   Not on file  Tobacco Use   Smoking status: Former   Smokeless tobacco: Never  Vaping Use   Vaping status: Never Used  Substance and Sexual Activity   Alcohol use: Yes    Comment: rarely   Drug use: No   Sexual activity: Yes    Partners: Male    Birth control/protection: Surgical  Other Topics Concern    Not on file  Social History Narrative   Not on file   Social Drivers of Health   Financial Resource Strain: Patient Declined (03/24/2023)   Received from [facility]   Overall Financial Resource Strain (CARDIA)    Difficulty of Paying Living Expenses: Patient declined  Food Insecurity: No Food Insecurity (04/13/2023)   Hunger Vital Sign    Worried About Running Out of Food in the Last Year: Never true    Ran Out of Food in the Last Year: Never true  Transportation Needs: No Transportation Needs (04/13/2023)   PRAPARE - Administrator, Civil Service (Medical): No    Lack of Transportation (Non-Medical): No  Physical Activity: Unknown (03/24/2023)   Received from [facility]   Exercise Vital Sign    Days of Exercise per Week: Patient declined    Minutes of Exercise per Session: Not on file  Stress: Patient Declined (03/24/2023)   Received from [facility] - Occupational Stress Questionnaire    Feeling of Stress : Patient declined  Social Connections: Patient Declined (03/24/2023)   Received from [facility]   Social Network    How  would you rate your social network (family, work, friends)?: Patient declined  Intimate Partner Violence: Not At Risk (04/13/2023)   Humiliation, Afraid, Rape, and Kick questionnaire    Fear of Current or Ex-Partner: No    Emotionally Abused: No    Physically Abused: No    Sexually Abused: No    FAMILY HISTORY: Family History  Problem Relation Age of Onset   Diabetes Father    Depression Father    Alcohol abuse Other    Drug abuse Other    Depression Other    Cancer Neg Hx    Heart disease Neg Hx    Early death Neg Hx    Hyperlipidemia Neg Hx    Hypertension Neg Hx    Kidney disease Neg Hx    Learning disabilities Neg Hx    Stroke Neg Hx     ALLERGIES:  She is allergic to cymbalta [duloxetine hcl].  MEDICATIONS:  Current Outpatient Medications  Medication Sig Dispense Refill    Multiple Vitamin (MULTIVITAMIN WITH MINERALS) TABS tablet Take 1 tablet by mouth daily.     Vilazodone HCl (VIIBRYD) 40 MG TABS Take 1 tablet (40 mg total) by mouth daily. 90 tablet 0   indapamide (LOZOL) 1.25 MG tablet Take 1 tablet (1.25 mg total) by mouth daily. (Patient not taking: Reported on 04/13/2023) 90 tablet 0   medroxyPROGESTERone (PROVERA) 10 MG tablet Take by mouth. (Patient not taking: Reported on 04/13/2023)     No current facility-administered medications for this visit.    REVIEW OF SYSTEMS:    Review of Systems - Oncology  All other pertinent systems were reviewed with the patient and are negative.  PHYSICAL EXAMINATION:    Onc Performance Status - 04/13/23 1417       ECOG Perf Status   ECOG Perf Status Fully active, able to carry on all pre-disease performance without restriction      KPS SCALE   KPS % SCORE Normal, no compliants, no evidence of disease              Vitals:   04/13/23 1410 04/13/23 1417  BP: (!) 150/82 (!) 156/99  Pulse: 88   Resp: 16   Temp: 97.6 F (36.4 C)   SpO2: 98%    Filed Weights   04/13/23 1410  Weight: 211 lb 9.6 oz (96 kg)    Physical Exam Constitutional:      General: She is not in acute distress.    Appearance: Normal appearance.  HENT:     Head: Normocephalic and atraumatic.  Eyes:     General: No scleral icterus.    Conjunctiva/sclera: Conjunctivae normal.  Cardiovascular:     Rate and Rhythm: Normal rate and regular rhythm.     Heart sounds: Normal heart sounds.  Pulmonary:     Effort: Pulmonary effort is normal.     Breath sounds: Normal breath sounds.  Abdominal:     General: There is no distension.     Palpations: Abdomen is soft. There is no mass.  Musculoskeletal:     Right lower leg: No edema.     Left lower leg: No edema.  Lymphadenopathy:     Cervical: No cervical adenopathy.  Neurological:     General: No focal deficit present.     Mental Status: She is alert and oriented to person,  place, and time.  Psychiatric:        Mood and Affect: Mood normal.        Behavior: Behavior  normal.        Thought Content: Thought content normal.     LABORATORY DATA:   I have reviewed the data as listed  Results for orders placed or performed in visit on 04/13/23  Iron and Iron Binding Capacity ([facility] only)  Result Value Ref Range   Iron 24 (L) 28 - 170 ug/dL   TIBC 161 (H) 096 - 045 ug/dL   Saturation Ratios 4 (L) 10.4 - 31.8 %   UIBC 609 (H) 148 - 442 ug/dL  Lactate dehydrogenase  Result Value Ref Range   LDH 190 98 - 192 U/L  CMP (Cancer Center only)  Result Value Ref Range   Sodium 139 135 - 145 mmol/L   Potassium 4.0 3.5 - 5.1 mmol/L   Chloride 106 98 - 111 mmol/L   CO2 25 22 - 32 mmol/L   Glucose, Bld 86 70 - 99 mg/dL   BUN 14 6 - 20 mg/dL   Creatinine 4.09 8.11 - 1.00 mg/dL   Calcium 9.6 8.9 - 91.4 mg/dL   Total Protein 7.9 6.5 - 8.1 g/dL   Albumin 4.7 3.5 - 5.0 g/dL   AST 24 15 - 41 U/L   ALT 24 0 - 44 U/L   Alkaline Phosphatase 58 38 - 126 U/L   Total Bilirubin 0.3 0.0 - 1.2 mg/dL   GFR, Estimated >78 >29 mL/min   Anion gap 8 5 - 15  CBC with Differential (Cancer Center Only)  Result Value Ref Range   WBC Count 12.4 (H) 4.0 - 10.5 K/uL   RBC 5.06 3.87 - 5.11 MIL/uL   Hemoglobin 10.8 (L) 12.0 - 15.0 g/dL   HCT 56.2 (L) 13.0 - 86.5 %   MCV 69.2 (L) 80.0 - 100.0 fL   MCH 21.3 (L) 26.0 - 34.0 pg   MCHC 30.9 30.0 - 36.0 g/dL   RDW 78.4 (H) 69.6 - 29.5 %   Platelet Count 683 (H) 150 - 400 K/uL   nRBC 0.0 0.0 - 0.2 %   Neutrophils Relative % 46 %   Neutro Abs 5.7 1.7 - 7.7 K/uL   Lymphocytes Relative 41 %   Lymphs Abs 5.0 (H) 0.7 - 4.0 K/uL   Monocytes Relative 7 %   Monocytes Absolute 0.9 0.1 - 1.0 K/uL   Eosinophils Relative 5 %   Eosinophils Absolute 0.6 (H) 0.0 - 0.5 K/uL   Basophils Relative 1 %   Basophils Absolute 0.1 0.0 - 0.1 K/uL   WBC Morphology FEW VARIANT LYMPHS    Smear Review Normal platelet morphology    Immature Granulocytes 0  %   Abs Immature Granulocytes 0.03 0.00 - 0.07 K/uL   Ovalocytes PRESENT      RADIOGRAPHIC STUDIES:  I have personally reviewed the radiological images as listed and agree with the findings in the report.  MM 3D SCREENING MAMMOGRAM BILATERAL BREAST Result Date: 04/10/2023 CLINICAL DATA:  Screening. EXAM: DIGITAL SCREENING BILATERAL MAMMOGRAM WITH TOMOSYNTHESIS AND CAD TECHNIQUE: Bilateral screening digital craniocaudal and mediolateral oblique mammograms were obtained. Bilateral screening digital breast tomosynthesis was performed. The images were evaluated with computer-aided detection. COMPARISON:  Previous exam(s). ACR Breast Density Category b: There are scattered areas of fibroglandular density. FINDINGS: There are no findings suspicious for malignancy. IMPRESSION: No mammographic evidence of malignancy. A result letter of this screening mammogram will be mailed directly to the patient. RECOMMENDATION: Screening mammogram in one year. (Code:SM-B-01Y) BI-RADS CATEGORY  1: Negative. Electronically Signed   By: Cathe Mons.D.  On: 04/10/2023 12:02    Orders Placed This Encounter  Procedures   CBC with Differential (Cancer Center Only)    Standing Status:   Future    Number of Occurrences:   1    Expiration Date:   04/12/2024   CMP (Cancer Center only)    Standing Status:   Future    Number of Occurrences:   1    Expiration Date:   04/12/2024   Lactate dehydrogenase    Standing Status:   Future    Number of Occurrences:   1    Expiration Date:   04/12/2024   Iron and Iron Binding Capacity ([facility] only)    Standing Status:   Future    Number of Occurrences:   1    Expiration Date:   04/12/2024   Ferritin    Standing Status:   Future    Number of Occurrences:   1    Expiration Date:   04/12/2024   Flow Cytometry, Peripheral Blood (Oncology)    Standing Status:   Future    Number of Occurrences:   1    Expiration Date:   04/12/2024   BCR-ABL1 FISH    Standing Status:   Future     Number of Occurrences:   1    Expiration Date:   04/12/2024   JAK2 V617F rfx CALR/MPL/E12-15    Standing Status:   Future    Number of Occurrences:   1    Expiration Date:   04/12/2024    Future Appointments  Date Time Provider Department Center  04/27/2023  8:30 AM Sarenity Ramaker, Archie Patten, MD [facility] None  05/16/2023  3:00 PM Etta Grandchild, MD [facility] None  07/13/2023  2:30 PM [facility] None  07/13/2023  3:00 PM Emrys Mceachron, MD [facility] None     I spent a total of 55 minutes during this encounter with the patient including review of chart and various tests results, discussions about plan of care and coordination of care plan.   This document was completed utilizing speech recognition software. Grammatical errors, random word insertions, pronoun errors, and incomplete sentences are an occasional consequence of this system due to software limitations, ambient noise, and hardware issues. Any formal questions or concerns about the content, text or information contained within the body of this dictation should be directly addressed to the provider for clarification.

## 2023-04-13 NOTE — Assessment & Plan Note (Addendum)
Iron deficiency anemia likely secondary to menorrhagia. Reports heavy cycles every 28 days. Iron levels are low, contributing to high platelet count. Discussed that IV iron infusion can help improve iron levels and reduce platelet count. Explained that iron infusion should last at least three months, after which re-evaluation will be done.  Hemoglobin 10.8, MCV 69 today.  Iron studies indicate iron deficiency.  We will arrange IV iron with Feraheme weekly x 2 doses.  - Coordinate with gynecologist for management of menorrhagia

## 2023-04-14 ENCOUNTER — Other Ambulatory Visit: Payer: Self-pay | Admitting: Internal Medicine

## 2023-04-14 ENCOUNTER — Telehealth: Payer: Self-pay | Admitting: Pharmacy Technician

## 2023-04-14 DIAGNOSIS — R0681 Apnea, not elsewhere classified: Secondary | ICD-10-CM

## 2023-04-14 DIAGNOSIS — F331 Major depressive disorder, recurrent, moderate: Secondary | ICD-10-CM

## 2023-04-14 LAB — FERRITIN: Ferritin: 4 ng/mL — ABNORMAL LOW (ref 11–307)

## 2023-04-14 NOTE — Telephone Encounter (Addendum)
Dr. Arlana Pouch, [place] Childes note:  Patient will be scheduled as soon as possible.  Auth Submission: NO AUTH NEEDED Site of care: Site of care: [facility] Payer: [employer] [place] Medication & CPT/J Code(s) submitted: Feraheme (ferumoxytol) [id number] Route of submission (phone, fax, portal):  Phone # Fax # Auth type: Buy/Bill PB Units/visits requested: 2 Reference number: [id number] Approval from: 04/14/23 to 09/11/23

## 2023-04-18 ENCOUNTER — Ambulatory Visit (INDEPENDENT_AMBULATORY_CARE_PROVIDER_SITE_OTHER): Payer: BC Managed Care – PPO | Admitting: *Deleted

## 2023-04-18 ENCOUNTER — Emergency Department (HOSPITAL_COMMUNITY)
Admission: EM | Admit: 2023-04-18 | Discharge: 2023-04-18 | Discharge status: Against Medical Advice | Payer: BC Managed Care – PPO | Attending: Emergency Medicine | Admitting: Emergency Medicine

## 2023-04-18 ENCOUNTER — Emergency Department (HOSPITAL_COMMUNITY): Payer: BC Managed Care – PPO

## 2023-04-18 VITALS — BP 103/71 | HR 72 | Temp 97.7°F | Resp 15 | Ht 67.0 in | Wt 212.6 lb

## 2023-04-18 DIAGNOSIS — R55 Syncope and collapse: Secondary | ICD-10-CM | POA: Insufficient documentation

## 2023-04-18 DIAGNOSIS — Z5321 Procedure and treatment not carried out due to patient leaving prior to being seen by health care provider: Secondary | ICD-10-CM | POA: Insufficient documentation

## 2023-04-18 DIAGNOSIS — D500 Iron deficiency anemia secondary to blood loss (chronic): Secondary | ICD-10-CM

## 2023-04-18 DIAGNOSIS — I959 Hypotension, unspecified: Secondary | ICD-10-CM | POA: Diagnosis not present

## 2023-04-18 LAB — BCR-ABL1 FISH
Cells Analyzed: 200
Cells Counted: 200

## 2023-04-18 LAB — COMPREHENSIVE METABOLIC PANEL
ALT: 33 U/L (ref 0–44)
AST: 33 U/L (ref 15–41)
Albumin: 3.8 g/dL (ref 3.5–5.0)
Alkaline Phosphatase: 55 U/L (ref 38–126)
Anion gap: 9 (ref 5–15)
BUN: 15 mg/dL (ref 6–20)
CO2: 22 mmol/L (ref 22–32)
Calcium: 8.9 mg/dL (ref 8.9–10.3)
Chloride: 107 mmol/L (ref 98–111)
Creatinine, Ser: 0.71 mg/dL (ref 0.44–1.00)
GFR, Estimated: >60 mL/min (ref >60)
Glucose, Bld: 134 mg/dL — ABNORMAL HIGH (ref 70–99)
Potassium: 4 mmol/L (ref 3.5–5.1)
Sodium: 138 mmol/L (ref 135–145)
Total Bilirubin: 0.4 mg/dL (ref 0.0–1.2)
Total Protein: 7.1 g/dL (ref 6.5–8.1)

## 2023-04-18 LAB — CBC WITH DIFFERENTIAL/PLATELET
Abs Immature Granulocytes: 0.04 10*3/uL (ref 0.00–0.07)
Basophils Absolute: 0.1 10*3/uL (ref 0.0–0.1)
Basophils Relative: 1 %
Eosinophils Absolute: 0.5 10*3/uL (ref 0.0–0.5)
Eosinophils Relative: 5 %
HCT: 31.6 % — ABNORMAL LOW (ref 36.0–46.0)
Hemoglobin: 9.4 g/dL — ABNORMAL LOW (ref 12.0–15.0)
Immature Granulocytes: 0 %
Lymphocytes Relative: 33 %
Lymphs Abs: 3.6 10*3/uL (ref 0.7–4.0)
MCH: 21.3 pg — ABNORMAL LOW (ref 26.0–34.0)
MCHC: 29.7 g/dL — ABNORMAL LOW (ref 30.0–36.0)
MCV: 71.5 fL — ABNORMAL LOW (ref 80.0–100.0)
Monocytes Absolute: 0.5 10*3/uL (ref 0.1–1.0)
Monocytes Relative: 5 %
Neutro Abs: 5.9 10*3/uL (ref 1.7–7.7)
Neutrophils Relative %: 56 %
Platelets: 554 10*3/uL — ABNORMAL HIGH (ref 150–400)
RBC: 4.42 MIL/uL (ref 3.87–5.11)
RDW: 17.5 % — ABNORMAL HIGH (ref 11.5–15.5)
WBC: 10.7 10*3/uL — ABNORMAL HIGH (ref 4.0–10.5)
nRBC: 0 % (ref 0.0–0.2)

## 2023-04-18 LAB — CBG MONITORING, ED: Glucose-Capillary: 138 mg/dL — ABNORMAL HIGH (ref 70–99)

## 2023-04-18 MED ORDER — SODIUM CHLORIDE 0.9 % IV SOLN
510.0000 mg | Freq: Once | INTRAVENOUS | Status: DC
  Filled 2023-04-18: qty 17

## 2023-04-18 MED ORDER — ACETAMINOPHEN 325 MG PO TABS
650.0000 mg | ORAL_TABLET | Freq: Once | ORAL | Status: AC
Start: 2023-04-18 — End: 2023-04-18
  Administered 2023-04-18: 650 mg via ORAL
  Filled 2023-04-18: qty 2

## 2023-04-18 MED ORDER — DIPHENHYDRAMINE HCL 25 MG PO CAPS
25.0000 mg | ORAL_CAPSULE | Freq: Once | ORAL | Status: AC
  Administered 2023-04-18: 25 mg via ORAL
  Filled 2023-04-18: qty 1

## 2023-04-18 NOTE — Progress Notes (Signed)
 Diagnosis: Iron Deficiency Anemia  Provider:  Chilton Greathouse MD  Procedure: IV Infusion  IV Type: Peripheral, IV Location: R Hand   Feraheme (Ferumoxytol), Dose: 510 mg (Not given At this time )  While attempting to start  first IV in right antecubital, patient became vagal, sweaty and clammy , pale, remained alert and oriented but said in a low voice, I feel so sick, first b/P  85/52, HR 41, resps 16,  02 sat 93 by 1520pm B/p now down to 70/41, sat 97%, 2nd IV attempted and started in right hand,  1000cc NS hung at 999/h physicians from in house here at patient chair,  by 1535 B?p now up to 103/71 patient less sweaty but still pale,   EMS has been called and arrived at 1537 pm   EKG done by EMS,  Patient brother called and patient transported to [facility] by EMS      Post Infusion IV Care:  Transported to [facility] by EMS  Discharge: Condition: Stable.   Performed by:  Forrest Moron, RN

## 2023-04-18 NOTE — ED Triage Notes (Signed)
 Pt arrives via [facility] from infusion clinic. Pt was supposed to get an iron infusion for the second time. Pt states that approximately three weeks ago she had abnormal pap smear and heavy vaginal bleeding so her PCP referred to there. While getting an IV for the infusion she had syncopal episode. On EMS arrival she was reportedly very pale and had BP of 70/40. CBG 135 with EMS.

## 2023-04-18 NOTE — ED Provider Triage Note (Signed)
 Emergency Medicine Provider Triage Evaluation Note  Jennifer Mcknight , a 53 y.o. female  was evaluated in triage.  Pt complains of loss of consciousness.  Patient was reportedly preparing to receive an iron infusion when she had a syncopal episode while having IV started.  No prior history of similar symptoms with IV or blood draws.  Does report some history of medical anxiety but states that this was significantly different than her normal.  No recent heavy rectal or vaginal bleeding.  Does have a history of abnormal Pap smear states that her gynecologist is working her up with ultrasound imaging.  No recent heavy rectal bleeding.  Review of Systems  Positive: As above Negative: As above  Physical Exam  There were no vitals taken for this visit. Gen:   Awake, no distress   Resp:  Normal effort  MSK:   Moves extremities without difficulty  Other:  Slightly delayed capillary refill at 2 to 3 seconds indicating mild dehydration.  Medical Decision Making  Medically screening exam initiated at 4:25 PM.  Appropriate orders placed.  Jennifer Mcknight was informed that the remainder of the evaluation will be completed by another provider, this initial triage assessment does not replace that evaluation, and the importance of remaining in the ED until their evaluation is complete.     Smitty Knudsen, PA-C 04/18/23 1626

## 2023-04-19 ENCOUNTER — Telehealth: Payer: Self-pay | Admitting: Oncology

## 2023-04-19 DIAGNOSIS — N840 Polyp of corpus uteri: Secondary | ICD-10-CM | POA: Diagnosis not present

## 2023-04-19 DIAGNOSIS — Z01812 Encounter for preprocedural laboratory examination: Secondary | ICD-10-CM | POA: Diagnosis not present

## 2023-04-19 DIAGNOSIS — N939 Abnormal uterine and vaginal bleeding, unspecified: Secondary | ICD-10-CM | POA: Diagnosis not present

## 2023-04-19 DIAGNOSIS — R87611 Atypical squamous cells cannot exclude high grade squamous intraepithelial lesion on cytologic smear of cervix (ASC-H): Secondary | ICD-10-CM | POA: Diagnosis not present

## 2023-04-19 DIAGNOSIS — R3915 Urgency of urination: Secondary | ICD-10-CM | POA: Diagnosis not present

## 2023-04-19 NOTE — Telephone Encounter (Signed)
 Rescheduled appointments per patient's request.

## 2023-04-20 DIAGNOSIS — N840 Polyp of corpus uteri: Secondary | ICD-10-CM | POA: Insufficient documentation

## 2023-04-20 DIAGNOSIS — N939 Abnormal uterine and vaginal bleeding, unspecified: Secondary | ICD-10-CM | POA: Insufficient documentation

## 2023-04-20 LAB — JAK2 V617F RFX CALR/MPL/E12-15

## 2023-04-20 LAB — CALR +MPL + E12-E15  (REFLEX)

## 2023-04-21 LAB — SURGICAL PATHOLOGY

## 2023-04-24 LAB — FLOW CYTOMETRY

## 2023-04-25 ENCOUNTER — Encounter: Payer: Self-pay | Admitting: Oncology

## 2023-04-25 ENCOUNTER — Ambulatory Visit: Payer: BC Managed Care – PPO

## 2023-04-27 ENCOUNTER — Telehealth: Payer: BC Managed Care – PPO | Admitting: Oncology

## 2023-05-08 ENCOUNTER — Ambulatory Visit: Payer: BC Managed Care – PPO

## 2023-05-10 ENCOUNTER — Telehealth: Payer: Self-pay | Admitting: Oncology

## 2023-05-10 NOTE — Telephone Encounter (Signed)
 Left Message - Rescheduled patient's appt, advised patient to contact me to reschedule if needed. Provided my direct line.

## 2023-05-15 ENCOUNTER — Ambulatory Visit: Payer: BC Managed Care – PPO

## 2023-05-15 VITALS — BP 138/75 | HR 62 | Temp 98.2°F | Resp 18 | Ht 67.0 in | Wt 212.0 lb

## 2023-05-15 DIAGNOSIS — D500 Iron deficiency anemia secondary to blood loss (chronic): Secondary | ICD-10-CM

## 2023-05-15 MED ORDER — DIPHENHYDRAMINE HCL 25 MG PO CAPS
25.0000 mg | ORAL_CAPSULE | Freq: Once | ORAL | Status: AC
  Administered 2023-05-15: 25 mg via ORAL
  Filled 2023-05-15: qty 1

## 2023-05-15 MED ORDER — SODIUM CHLORIDE 0.9 % IV SOLN
510.0000 mg | Freq: Once | INTRAVENOUS | Status: AC
  Administered 2023-05-15: 510 mg via INTRAVENOUS
  Filled 2023-05-15: qty 17

## 2023-05-15 MED ORDER — ACETAMINOPHEN 325 MG PO TABS
650.0000 mg | ORAL_TABLET | Freq: Once | ORAL | Status: AC
  Administered 2023-05-15: 650 mg via ORAL
  Filled 2023-05-15: qty 2

## 2023-05-15 NOTE — Progress Notes (Signed)
 Diagnosis: Iron Deficiency Anemia  Provider:  Chilton Greathouse MD  Procedure: IV Infusion  IV Type: Peripheral, IV Location: R Forearm  Feraheme (Ferumoxytol), Dose: 510 mg  Infusion Start Time: 1537  Infusion Stop Time: 1555  Post Infusion IV Care: Observation period completed and Peripheral IV Discontinued  Discharge: Condition: Good, Destination: Home . AVS Declined  Performed by:  Adriana Mccallum, RN

## 2023-05-16 ENCOUNTER — Ambulatory Visit: Payer: BC Managed Care – PPO | Admitting: Internal Medicine

## 2023-05-16 ENCOUNTER — Encounter: Payer: Self-pay | Admitting: Internal Medicine

## 2023-05-16 VITALS — BP 136/82 | HR 85 | Temp 98.5°F | Ht 67.0 in | Wt 215.4 lb

## 2023-05-16 DIAGNOSIS — R7303 Prediabetes: Secondary | ICD-10-CM

## 2023-05-16 DIAGNOSIS — G4733 Obstructive sleep apnea (adult) (pediatric): Secondary | ICD-10-CM

## 2023-05-16 DIAGNOSIS — E66811 Obesity, class 1: Secondary | ICD-10-CM

## 2023-05-16 DIAGNOSIS — I10 Essential (primary) hypertension: Secondary | ICD-10-CM

## 2023-05-16 DIAGNOSIS — Z23 Encounter for immunization: Secondary | ICD-10-CM

## 2023-05-16 LAB — POCT GLYCOSYLATED HEMOGLOBIN (HGB A1C): Hemoglobin A1C: 5.5 % (ref 4.0–5.6)

## 2023-05-16 MED ORDER — ZEPBOUND 2.5 MG/0.5ML ~~LOC~~ SOAJ: 2.5000 mg | SUBCUTANEOUS | 0 refills | Status: DC

## 2023-05-16 NOTE — Patient Instructions (Signed)
 Hypertension, Adult High blood pressure (hypertension) is when the force of blood pumping through the arteries is too strong. The arteries are the blood vessels that carry blood from the heart throughout the body. Hypertension forces the heart to work harder to pump blood and may cause arteries to become narrow or stiff. Untreated or uncontrolled hypertension can lead to a heart attack, heart failure, a stroke, kidney disease, and other problems. A blood pressure reading consists of a higher number over a lower number. Ideally, your blood pressure should be below 120/80. The first ("top") number is called the systolic pressure. It is a measure of the pressure in your arteries as your heart beats. The second ("bottom") number is called the diastolic pressure. It is a measure of the pressure in your arteries as the heart relaxes. What are the causes? The exact cause of this condition is not known. There are some conditions that result in high blood pressure. What increases the risk? Certain factors may make you more likely to develop high blood pressure. Some of these risk factors are under your control, including: Smoking. Not getting enough exercise or physical activity. Being overweight. Having too much fat, sugar, calories, or salt (sodium) in your diet. Drinking too much alcohol. Other risk factors include: Having a personal history of heart disease, diabetes, high cholesterol, or kidney disease. Stress. Having a family history of high blood pressure and high cholesterol. Having obstructive sleep apnea. Age. The risk increases with age. What are the signs or symptoms? High blood pressure may not cause symptoms. Very high blood pressure (hypertensive crisis) may cause: Headache. Fast or irregular heartbeats (palpitations). Shortness of breath. Nosebleed. Nausea and vomiting. Vision changes. Severe chest pain, dizziness, and seizures. How is this diagnosed? This condition is diagnosed by  measuring your blood pressure while you are seated, with your arm resting on a flat surface, your legs uncrossed, and your feet flat on the floor. The cuff of the blood pressure monitor will be placed directly against the skin of your upper arm at the level of your heart. Blood pressure should be measured at least twice using the same arm. Certain conditions can cause a difference in blood pressure between your right and left arms. If you have a high blood pressure reading during one visit or you have normal blood pressure with other risk factors, you may be asked to: Return on a different day to have your blood pressure checked again. Monitor your blood pressure at home for 1 week or longer. If you are diagnosed with hypertension, you may have other blood or imaging tests to help your health care provider understand your overall risk for other conditions. How is this treated? This condition is treated by making healthy lifestyle changes, such as eating healthy foods, exercising more, and reducing your alcohol intake. You may be referred for counseling on a healthy diet and physical activity. Your health care provider may prescribe medicine if lifestyle changes are not enough to get your blood pressure under control and if: Your systolic blood pressure is above 130. Your diastolic blood pressure is above 80. Your personal target blood pressure may vary depending on your medical conditions, your age, and other factors. Follow these instructions at home: Eating and drinking  Eat a diet that is high in fiber and potassium, and low in sodium, added sugar, and fat. An example of this eating plan is called the DASH diet. DASH stands for Dietary Approaches to Stop Hypertension. To eat this way: Eat  plenty of fresh fruits and vegetables. Try to fill one half of your plate at each meal with fruits and vegetables. Eat whole grains, such as whole-wheat pasta, brown rice, or whole-grain bread. Fill about one  fourth of your plate with whole grains. Eat or drink low-fat dairy products, such as skim milk or low-fat yogurt. Avoid fatty cuts of meat, processed or cured meats, and poultry with skin. Fill about one fourth of your plate with lean proteins, such as fish, chicken without skin, beans, eggs, or tofu. Avoid pre-made and processed foods. These tend to be higher in sodium, added sugar, and fat. Reduce your daily sodium intake. Many people with hypertension should eat less than 1,500 mg of sodium a day. Do not drink alcohol if: Your health care provider tells you not to drink. You are pregnant, may be pregnant, or are planning to become pregnant. If you drink alcohol: Limit how much you have to: 0-1 drink a day for women. 0-2 drinks a day for men. Know how much alcohol is in your drink. In the [place] one drink equals one 12 oz bottle of beer (355 mL), one 5 oz glass of wine (148 mL), or one 1 oz glass of hard liquor (44 mL). Lifestyle  Work with your health care provider to maintain a healthy body weight or to lose weight. Ask what an ideal weight is for you. Get at least 30 minutes of exercise that causes your heart to beat faster (aerobic exercise) most days of the week. Activities may include walking, swimming, or biking. Include exercise to strengthen your muscles (resistance exercise), such as Pilates or lifting weights, as part of your weekly exercise routine. Try to do these types of exercises for 30 minutes at least 3 days a week. Do not use any products that contain nicotine or tobacco. These products include cigarettes, chewing tobacco, and vaping devices, such as e-cigarettes. If you need help quitting, ask your health care provider. Monitor your blood pressure at home as told by your health care provider. Keep all follow-up visits. This is important. Medicines Take over-the-counter and prescription medicines only as told by your health care provider. Follow directions carefully. Blood  pressure medicines must be taken as prescribed. Do not skip doses of blood pressure medicine. Doing this puts you at risk for problems and can make the medicine less effective. Ask your health care provider about side effects or reactions to medicines that you should watch for. Contact a health care provider if you: Think you are having a reaction to a medicine you are taking. Have headaches that keep coming back (recurring). Feel dizzy. Have swelling in your ankles. Have trouble with your vision. Get help right away if you: Develop a severe headache or confusion. Have unusual weakness or numbness. Feel faint. Have severe pain in your chest or abdomen. Vomit repeatedly. Have trouble breathing. These symptoms may be an emergency. Get help right away. Call 911. Do not wait to see if the symptoms will go away. Do not drive yourself to the hospital. Summary Hypertension is when the force of blood pumping through your arteries is too strong. If this condition is not controlled, it may put you at risk for serious complications. Your personal target blood pressure may vary depending on your medical conditions, your age, and other factors. For most people, a normal blood pressure is less than 120/80. Hypertension is treated with lifestyle changes, medicines, or a combination of both. Lifestyle changes include losing weight, eating a healthy,  low-sodium diet, exercising more, and limiting alcohol. This information is not intended to replace advice given to you by your health care provider. Make sure you discuss any questions you have with your health care provider. Document Revised: 12/15/2020 Document Reviewed: 12/15/2020 Elsevier Patient Education  2024 [employer].

## 2023-05-16 NOTE — Progress Notes (Unsigned)
 Subjective:  Patient ID: Jennifer Mcknight, female    DOB: 1970-05-02  Age: 53 y.o. MRN: [id number]  CC: Hypertension and Anemia   HPI Jennifer Mcknight presents for f/up ----  Discussed the use of AI scribe software for clinical note transcription with the patient, who gave verbal consent to proceed.  History of Present Illness   Jennifer Mcknight is a 53 year old female who presents for follow-up after an iron infusion.  She feels exhausted following her first iron infusion, which she received yesterday. No allergic reactions or side effects from the infusion, aside from feeling tired.  She has a history of heavy menstrual bleeding, described as 'horrendous,' occurring every 28 days. She has consulted with a gynecologist who plans to remove a benign growth and insert an IUD to help manage the bleeding. A Pap smear approximately one month ago was followed by additional testing that returned benign results.  Her weight has been increasing steadily, noting 'every time I step on the scale, it's going up.' A recent blood sugar level was 134 mg/dL, which will be rechecked today. No pain during recent blood draws, although two veins were blown before a successful draw was achieved.  Her blood pressure is well controlled. She acknowledges a history of depression, which she describes as fine now       Outpatient Medications Prior to Visit  Medication Sig Dispense Refill   medroxyPROGESTERone (PROVERA) 10 MG tablet Take by mouth.     Multiple Vitamin (MULTIVITAMIN WITH MINERALS) TABS tablet Take 1 tablet by mouth daily.     valACYclovir (VALTREX) 500 MG tablet Take 500 mg by mouth as needed.     Vilazodone HCl (VIIBRYD) 40 MG TABS TAKE 1 TABLET(40 MG) BY MOUTH DAILY 90 tablet 0   indapamide (LOZOL) 1.25 MG tablet Take 1 tablet (1.25 mg total) by mouth daily. 90 tablet 0   No facility-administered medications prior to visit.    ROS Review of Systems  Constitutional:  Positive for unexpected weight change  (wt gain). Negative for appetite change, chills, diaphoresis, fatigue and fever.  HENT:  Negative for trouble swallowing.   Respiratory:  Positive for apnea. Negative for chest tightness, shortness of breath and wheezing.   Cardiovascular:  Negative for chest pain, palpitations and leg swelling.  Gastrointestinal: Negative.  Negative for abdominal pain, constipation, diarrhea, nausea and vomiting.  Genitourinary:  Negative for difficulty urinating.  Musculoskeletal: Negative.   Skin: Negative.   Neurological:  Negative for dizziness and weakness.  Hematological:  Negative for adenopathy. Does not bruise/bleed easily.  Psychiatric/Behavioral: Negative.      Objective:  BP 136/82 (BP Location: Right Arm, Patient Position: Sitting, Cuff Size: Normal)   Pulse 85   Temp 98.5 F (36.9 C) (Oral)   Ht 5\' 7"  (1.702 m)   Wt 215 lb 6.4 oz (97.7 kg)   LMP 04/28/2023   SpO2 96%   BMI 33.74 kg/m   BP Readings from Last 3 Encounters:  05/16/23 136/82  05/15/23 138/75  04/18/23 (!) 146/79    Wt Readings from Last 3 Encounters:  05/16/23 215 lb 6.4 oz (97.7 kg)  05/15/23 212 lb (96.2 kg)  04/18/23 212 lb 9.6 oz (96.4 kg)    Physical Exam Vitals reviewed.  Constitutional:      Appearance: Normal appearance.  HENT:     Nose: Nose normal.     Mouth/Throat:     Mouth: Mucous membranes are moist.  Eyes:     General: No scleral icterus.  Conjunctiva/sclera: Conjunctivae normal.  Cardiovascular:     Rate and Rhythm: Normal rate and regular rhythm.     Heart sounds: No murmur heard.    No friction rub. No gallop.  Pulmonary:     Effort: Pulmonary effort is normal.     Breath sounds: No stridor. No wheezing, rhonchi or rales.  Abdominal:     General: Abdomen is flat.     Palpations: There is no mass.     Tenderness: There is no abdominal tenderness. There is no guarding.     Hernia: No hernia is present.  Musculoskeletal:        General: Normal range of motion.     Cervical  back: Neck supple.     Right lower leg: No edema.     Left lower leg: No edema.  Lymphadenopathy:     Cervical: No cervical adenopathy.  Skin:    General: Skin is warm and dry.  Neurological:     General: No focal deficit present.     Mental Status: She is alert. Mental status is at baseline.  Psychiatric:        Mood and Affect: Mood normal.        Behavior: Behavior normal.     Lab Results  Component Value Date   WBC 10.7 (H) 04/18/2023   HGB 9.4 (L) 04/18/2023   HCT 31.6 (L) 04/18/2023   PLT 554 (H) 04/18/2023   GLUCOSE 134 (H) 04/18/2023   CHOL 275 (H) 01/16/2023   TRIG 238.0 (H) 01/16/2023   HDL 58.70 01/16/2023   LDLDIRECT 185.0 07/27/2021   LDLCALC 169 (H) 01/16/2023   ALT 33 04/18/2023   AST 33 04/18/2023   NA 138 04/18/2023   K 4.0 04/18/2023   CL 107 04/18/2023   CREATININE 0.71 04/18/2023   BUN 15 04/18/2023   CO2 22 04/18/2023   TSH 2.60 11/10/2022   INR 1.0 02/17/2020   HGBA1C 5.5 05/16/2023    DG Chest Portable 1 View Result Date: 04/18/2023 CLINICAL DATA:  Syncope. EXAM: PORTABLE CHEST 1 VIEW COMPARISON:  Chest radiograph dated 11/10/2022. FINDINGS: The heart size and mediastinal contours are within normal limits. Both lungs are clear. The visualized skeletal structures are unremarkable. IMPRESSION: No active disease. Electronically Signed   By: Elgie Collard M.D.   On: 04/18/2023 17:31    Assessment & Plan:   Essential hypertension- She has not achieved her BP goal of 130/80. Will restart indapamide. -     Indapamide; Take 1 tablet (1.25 mg total) by mouth daily.  Dispense: 90 tablet; Refill: 1  Prediabetes -     POCT glycosylated hemoglobin (Hb A1C)  Immunization due -     Varicella-zoster vaccine IM  Class 1 obesity due to excess calories with serious comorbidity and body mass index (BMI) of 34.0 to 34.9 in adult -     Zepbound; Inject 2.5 mg into the skin once a week.  Dispense: 4 mL; Refill: 0  OSA (obstructive sleep apnea) -      Zepbound; Inject 2.5 mg into the skin once a week.  Dispense: 4 mL; Refill: 0     Follow-up: Return in about 6 months (around 11/16/2023).  Jennifer Linger, MD

## 2023-05-18 ENCOUNTER — Inpatient Hospital Stay: Payer: BC Managed Care – PPO | Attending: Oncology | Admitting: Oncology

## 2023-05-18 ENCOUNTER — Encounter: Payer: Self-pay | Admitting: Oncology

## 2023-05-18 DIAGNOSIS — D500 Iron deficiency anemia secondary to blood loss (chronic): Secondary | ICD-10-CM

## 2023-05-18 DIAGNOSIS — D75839 Thrombocytosis, unspecified: Secondary | ICD-10-CM

## 2023-05-18 DIAGNOSIS — D72829 Elevated white blood cell count, unspecified: Secondary | ICD-10-CM | POA: Diagnosis not present

## 2023-05-18 NOTE — Assessment & Plan Note (Signed)
 Chronic thrombocytosis with a history of high platelet count. Previous workup in August 2020 showed no evidence of JAK2, CALR, MPL, exon 12-15 mutations.  BCR/ABL 1 was negative.   Current high platelet count may be related to iron deficiency due to menorrhagia.   On her consultation with [place] on 04/13/2023, labs reveal persistent thrombocytosis with platelet count of 683,000.  Hemoglobin 10.8, MCV 69.  White count 12,400 with mild lymphocytosis of 5000.  Repeat JAK2 mutation analysis, CALR, MPL, exon 12-15 mutation analysis were all negative.  BCR/ABL 1 was negative.  Ferritin decreased at 4.  Iron studies indicated evidence of iron deficiency with iron saturation of 4%, iron decreased to 24, increased iron binding capacity of 633.  We proceeded with IV iron using Feraheme x 2 doses.  - Schedule in-person follow-up in three months

## 2023-05-18 NOTE — Assessment & Plan Note (Signed)
 Iron deficiency anemia likely secondary to menorrhagia. Reports heavy cycles every 28 days. Iron levels are low, contributing to high platelet count. Discussed that IV iron infusion can help improve iron levels and reduce platelet count. Explained that iron infusion should last at least three months, after which re-evaluation will be done.  Hemoglobin 10.8, MCV 69 on 04/13/23.  Iron studies indicate iron deficiency.  We will arrange IV iron with Feraheme weekly x 2 doses.  - Coordinate with gynecologist for management of menorrhagia

## 2023-05-18 NOTE — Assessment & Plan Note (Addendum)
 Chronic intermittent leukocytosis.  Flow cytometry of peripheral blood was unremarkable on 04/13/2023.  BCR/ABL 1 negative.  No evidence of primary myeloproliferative neoplasm.  Will continue to monitor this.

## 2023-05-18 NOTE — Progress Notes (Signed)
 [facility]  HEMATOLOGY-ONCOLOGY ELECTRONIC VISIT PROGRESS NOTE  PATIENT NAME: Jennifer Mcknight   MR#: [id number] DOB: 1971-01-12  DATE OF SERVICE: 05/18/2023  Patient Care Team: Etta Grandchild, MD as PCP - General  I connected with the patient via telephone conference and verified that I am speaking with the correct person using two identifiers. The patient's location is at home and I am providing care from the [facility]  I discussed the limitations, risks, security and privacy concerns of performing an evaluation and management service by e-visits and the availability of in person appointments.  I also discussed with the patient that there may be a patient responsible charge related to this service. The patient expressed understanding and agreed to proceed.   ASSESSMENT & PLAN:   Jennifer Mcknight is a 53 y.o. lady with past medical history of menorrhagia, sleep apnea, was referred to our service in February 2025 for evaluation of thrombocytosis.  Thrombocytosis Chronic thrombocytosis with a history of high platelet count. Previous workup in August 2020 showed no evidence of JAK2, CALR, MPL, exon 12-15 mutations.  BCR/ABL 1 was negative.   Current high platelet count may be related to iron deficiency due to menorrhagia.   On her consultation with [place] on 04/13/2023, labs reveal persistent thrombocytosis with platelet count of 683,000.  Hemoglobin 10.8, MCV 69.  White count 12,400 with mild lymphocytosis of 5000.  Repeat JAK2 mutation analysis, CALR, MPL, exon 12-15 mutation analysis were all negative.  BCR/ABL 1 was negative.  Ferritin decreased at 4.  Iron studies indicated evidence of iron deficiency with iron saturation of 4%, iron decreased to 24, increased iron binding capacity of 633.  We proceeded with IV iron using Feraheme x 2 doses.  - Schedule in-person follow-up in three months  Iron deficiency anemia due to chronic blood loss Iron deficiency anemia likely  secondary to menorrhagia. Reports heavy cycles every 28 days. Iron levels are low, contributing to high platelet count. Discussed that IV iron infusion can help improve iron levels and reduce platelet count. Explained that iron infusion should last at least three months, after which re-evaluation will be done.  Hemoglobin 10.8, MCV 69 on 04/13/23.  Iron studies indicate iron deficiency.  We will arrange IV iron with Feraheme weekly x 2 doses.  - Coordinate with gynecologist for management of menorrhagia  Leukocytosis Chronic intermittent leukocytosis.  Flow cytometry of peripheral blood was unremarkable on 04/13/2023.  BCR/ABL 1 negative.  No evidence of primary myeloproliferative neoplasm.  Will continue to monitor this.   I discussed the assessment and treatment plan with the patient. The patient was provided an opportunity to ask questions and all were answered. The patient agreed with the plan and demonstrated an understanding of the instructions. The patient was advised to call back or seek an in-person evaluation if the symptoms worsen or if the condition fails to improve as anticipated.    I spent 12 minutes over the phone with the patient reviewing test results, discuss management and coordination/planning of care.  Meryl Crutch, MD 05/18/2023 2:21 PM [facility] - A [facility] [address] [place] [address] Dept: [phone] Dept Fax: [phone]   INTERVAL HISTORY:  Please see above for problem oriented charting.  The purpose of today's discussion is to explain recent lab results and to formulate plan of care.  She recently started iron infusions. The first attempt was unsuccessful due  to difficult venous access, leading to an emergency department visit. The second attempt, on the other hand, was successful despite requiring three attempts to establish venous access. Post-infusion, the patient  reported feeling sore and tired, which she attributed to the infusion. She is scheduled for another infusion at the end of the month.  In addition to the anemia, the patient is dealing with significant weight gain, which she believes is contributing to her shortness of breath. She has consulted with another physician regarding this issue. The patient is also scheduled for a gynecological procedure to remove a growth and insert an IUD, which is expected to help with bleeding that may be contributing to the anemia.  SUMMARY OF HEMATOLOGY HISTORY:  On her follow-up visit with her PCP Dr. Yetta Barre on 01/16/2023, she was found to have elevated platelet count at 647,000.  Previously her platelet count was 695,000 in September 2024.  Given persistent thrombocytosis, referral was sent to [place] for further evaluation.  She was also found to have mild leukocytosis with white count of 14,700, anemia with hemoglobin of 10.2 in November 2024.   She reports recent rapid weight gain and heavy menstrual cycles, which occur every 28 days. The patient is currently seeing a gynecologist for these issues and has been prescribed progesterone to manage the heavy bleeding. The patient also mentions a potential growth in the uterus, which is currently being investigated by her gynecologist.   In November 2020, the patient underwent a gallbladder removal surgery due to gallstones, which she does not recall. The patient also has sleep apnea and is due for a sleep study.   She did not have prior splenectomy. She had no prior history or diagnosis of cancer. Her age appropriate screening programs are up-to-date. Patient has never been diagnosed with thrombotic events.  Chronic thrombocytosis with a history of high platelet count. Previous workup in August 2020 showed no evidence of JAK2, CALR, MPL, exon 12-15 mutations.  BCR/ABL 1 was negative.   Current high platelet count may be related to iron deficiency due to menorrhagia.   On  her consultation with [place] on 04/13/2023, labs reveal persistent thrombocytosis with platelet count of 683,000.  Hemoglobin 10.8, MCV 69.  White count 12,400 with mild lymphocytosis of 5000.  Repeat JAK2 mutation analysis, CALR, MPL, exon 12-15 mutation analysis were all negative.  BCR/ABL 1 was negative.  Ferritin decreased at 4.  Iron studies indicated evidence of iron deficiency with iron saturation of 4%, iron decreased to 24, increased iron binding capacity of 633.  We proceeded with IV iron using Feraheme x 2 doses.   Chronic intermittent leukocytosis.  Flow cytometry of peripheral blood was unremarkable on 04/13/2023.  BCR/ABL 1 negative.  No evidence of primary myeloproliferative neoplasm.  Will continue to monitor this.  REVIEW OF SYSTEMS:    Review of Systems - Oncology  All other pertinent systems were reviewed with the patient and are negative.  I have reviewed the past medical history, past surgical history, social history and family history with the patient and they are unchanged from previous note.  ALLERGIES:  She is allergic to cymbalta [duloxetine hcl].  MEDICATIONS:  Current Outpatient Medications  Medication Sig Dispense Refill   medroxyPROGESTERone (PROVERA) 10 MG tablet Take by mouth.     Multiple Vitamin (MULTIVITAMIN WITH MINERALS) TABS tablet Take 1 tablet by mouth daily.     tirzepatide (ZEPBOUND) 2.5 MG/0.5ML Pen Inject 2.5 mg into the skin once a week. 4 mL 0   valACYclovir (  VALTREX) 500 MG tablet Take 500 mg by mouth as needed.     Vilazodone HCl (VIIBRYD) 40 MG TABS TAKE 1 TABLET(40 MG) BY MOUTH DAILY 90 tablet 0   No current facility-administered medications for this visit.    PHYSICAL EXAMINATION:   Onc Performance Status - 05/18/23 1100       ECOG Perf Status   ECOG Perf Status Fully active, able to carry on all pre-disease performance without restriction      KPS SCALE   KPS % SCORE Normal, no compliants, no evidence of disease              LABORATORY DATA:   I have reviewed the data as listed.  Recent Results (from the past 2160 hours)  HM PAP SMEAR     Status: None   Collection Time: 04/11/23 12:00 AM  Result Value Ref Range   HM Pap smear GYN   Surgical pathology     Status: None   Collection Time: 04/13/23 12:00 AM  Result Value Ref Range   SURGICAL PATHOLOGY      Surgical Pathology CASE: [id number] PATIENT: Modean Brumbach Flow Pathology Report     Clinical history: Leukocytosis, unspecified type     DIAGNOSIS:  -  No monoclonal B-cell, phenotypically aberrant T-cell, or distinct blast population identified -  See comment  COMMENT:  The peripheral blood has microcytic anemia, absolute lymphocytosis and thrombocytosis.  Morphologically there is an increase in large granular lymphocytes observed and this population is also identified by flow cytometry.  However, there are no ovarian features identified by flow cytometry.  GATING AND PHENOTYPIC ANALYSIS:  Gated population: Flow cytometric immunophenotyping is performed using antibodies to the antigens listed in the table below. Electronic gates are placed around a cell cluster displaying light scatter properties corresponding to: lymphocytes  Abnormal Cells in gated population: N/A  Phenotype of Abnormal Cells: N/A                       Lymphoid Antigens        Myeloid Antigens Miscellaneous CD2  tested    CD10 tested    CD11b     ND   CD45 tested CD3  tested    CD19 tested    CD11c     ND   HLA-Dr    ND CD4  tested    CD20 tested    CD13 ND   CD34 tested CD5  tested    CD22 ND   CD14 ND   CD38 tested CD7  tested    CD79b     ND   CD15 ND   CD138     ND CD8  tested    CD103     ND   CD16 ND   TdT  ND CD25 ND   CD200     tested    CD33 ND   CD123     ND TCRab     ND   sKappa    tested    CD64 ND   CD41 ND TCRgd     tested    sLambda   tested    CD117     ND   CD61 ND CD56 tested    cKappa    ND   MPO  ND   CD71 ND CD57 ND   cLambda    ND        CD235aND        GROSS DESCRIPTION:  One  lavender top tube submitted from [facility] for lymphoma testing.    Final Diagnosis performed by Manning Charity, MD.   Electronically signed 04/21/2023 Technical component performed at [facility], [address], [place], [place] [address].  Professional component performed at [facility] [address], [place], [place] [address].   JAK2 V617F rfx CALR/MPL/E12-15     Status: None   Collection Time: 04/13/23  2:47 PM  Result Value Ref Range   Specimen Type Comment:     Comment: NOT PROVIDED   JAK2 V617F Result Comment     Comment: (NOTE) NEGATIVE The JAK2 V617F mutation is not detected in the provided specimen of this individual. Results should be interpreted in conjunction with clinical and other laboratory findings for the most accurate interpretation. This test was developed and its performance characteristics determined by [facility]. It has not been cleared or approved by the Food and Drug Administration.    Reflex Comment     Comment: (NOTE) Reflex to CALR Mutation Analysis, JAK2 Exon 12-15 Mutation Analysis, and MPL Mutation Analysis is indicated.    V617F Rfx CALR/MPL/E12-15 Bkgd Comment     Comment: (NOTE)    Molecular testing of blood or bone marrow is useful in the evaluation of suspected myeloproliferative neoplasms (MPN). Mutations in the JAK2, MPL, and CALR genes are present in virtually all MPNs and their presence help distinguish benign reactive processes from clonal neoplasms. These mutations are generally considered mutually exclusive, although concurrent clones have been reported in rare patients. This test will assess for the JAK2V617F (exon 14) mutation first and will reflex to CALR mutation analysis, MPL mutation analysis, and JAK2 exon 12 to 15 mutation analysis if the JAK2V617F mutation is negative.    The JAK2 (Janus kinase 2) gene encodes for a  non-receptor protein tyrosine kinase that activates cytokine and growth factor signaling. The V617F (c.1849 G>T) mutation results in constitutive activation of JAK2 and downstream STAT5 and ERK signaling. The V617F mutation is observed in approximately 95% of polycythemia vera (PV), 60% of essential thromboc ythemia (ET) and primary myelofibrosis (PMF). It is also infrequently present (3-5%) in myelodysplastic syndrome, chronic myelomonocytic leukemia, and other atypical chronic myeloid disorders. A small percentage of JAK2 mutation positive patients (3.3%) contain other non-V617F mutations within exons 12 to 15. In particular, mutations in exon 12 of JAK2 have been described in approximately 3% of patients with PV. JAK2 allele burden correlates with clinical phenotype, with low levels of mutant allele characterized by thrombocytosis, intermediate levels with erythrocytosis, and high mutant allele burden correlating with enhanced myelopoiesis of the BM, leukocytosis, increasing spleen size, and circulating CD34-positive cells.    The CALR (Calreticulin) gene encodes for a multifunctional calcium-binding protein involved in many cellular activities such as growth, proliferation, adhesion, and programmed cell death. Among patients with JAK2 negative MPNs, CALR are found in a pproximately 70% of patients with JAK2-negative essential thrombocythemia (ET) and 60-88% of patients with JAK2-negative primary myelofibrosis(PMF). Only a minority of patients (approximately 8%) with myelodysplasia have mutations in the CALR gene. CALR mutations are rarely detected in patients with de novo acute myeloid leukemia, chronic myelogenous leukemia, lymphoid leukemia, or solid tumors. CALR mutations are not detected in polycythemia and generally appear to be mutually exclusive with JAK2 mutations and MPL mutations. The majority of mutational changes involve a variety of insertion deletion mutations in  exon 9 of the calreticulin gene: approximately 53% of all CALR mutations are a 52 bp deletion (type-1) while  the second most prevalent mutation (approximately 32%) contains a 5 bp insertion (type-2). Other mutations (non-type 1 or type 2) are seen in a small minority of cases. CALR mutations in PMF tend to be with a favorable prognosis compared to JAK2 V617F [employer], whereas primary myelofibrosis negative for CALR, JAK2 V617F and MPL mutations (so-called triple negative) is associated with a poor prognosis and shorter survival.    The MPL (myeloproliferative leukemia virus oncogene) gene encodes the thrombopoietin receptor which regulates hematopoiesis and megakaryopoiesis. Activating MPL mutations are associated with a subset of myeloproliferative neoplasms and acute megakaryoblastic leukemia. MPL W515 mutations are present in approximately 5-8% of patients with primary myelofibrosis (PMF) and 1-4% of patients with essential thrombocythemia (ET). The S505 mutation is detected in patients with hereditary thrombocythemia.    Limitations    This assay has a sensitivity of approximately 1% VAF for JAK2 V617F, 2.5% VAF for other mutations in JAK2 exons 12 to 15, CALR mutations, and MPL mutations. Deletions in JAK2 up to 6 bp and insertions up to 34 bp have been detected in validation studies. Deletions in CALR up to 70 bp and i nsertions up to 12 bp have been detected in validation studies.    Method based next generation sequencing.     Comment: Comment [employer]    References Comment     Comment: (NOTE) Alghasham N, Alnouri Y, Abalkhail H, Clarita Leber. Detection of mutations in JAK2 exons 12-15 by [employer] sequencing. Int J Lab Hematol. 2016 Feb;38(1):34-41. doi: 10.1111/ijlh.16109. Epub 2015 Sep 11. PMID: 60454098. Pura Spice, Unk Lightning, Hasserjian R, Patrica Duel, Borowitz MJ, Leroy Libman MM, [place] CD, [place], Vardiman JW. The 2016 revision to the World Health Organization  classification of myeloid neoplasms and acute leukemia. Blood. 2016 May 19;127(20):2391-405. doi: 10.1182/blood-2016-03-643544. Epub 2016 Apr 11. PMID: 11914782. Genevie Ann [facility], Zhang ZJ, [place] S, Albitar M. Mutation profile of JAK2 transcripts in patients with chronic myeloproliferative neoplasias. J Mol Diagn. 2009 Jan;11(1):49-53.doi: 10.2353/jmoldx.2009.080114. Epub 2008 Dec 12. PMID: 95621308; PMCID: MVH8469629. NCCN Clinical Practice Guidelines in Oncology (NCCN Guidelines) Myeloproliferative Neoplasms Version 3.2022 - October 01, 2020. Swerdlow SH, Programmer, multimedia. WHO classif ication of Tumours of Haematopoietic and Lymphoid Tissues. 4th edn. Jaci Standard, [place]: Geologist, engineering for [employer] on [employer]; 2017. Tefferi A. Primary myelofibrosis: 2021 update on diagnosis, risk-stratification and management. Am J Hematol. 2021 Jan;96(1):145-162. doi: 10.1002/ajh.26050. Epub 2020 Dec 2. PMID: 52841324. Royetta Car, Kralovics R. Genetic basis and molecular pathophysiology of classical myeloproliferative neoplasms. Blood. 2017 Feb 9;129(6):667-679. doi: 10.1182/blood-2016-10-695940. Epub 2016 Dec 27. PMID: 40102725.    Director Review Comment     Comment: (NOTE) Mellody Life, PhD, [facility]    Director, Molecular Oncology    [facility]    [address] [place], [place] [address]    [phone] Performed At: [facility] [address] [place], [place] [id number] Maurine Simmering MDPhD [id number] Performed At: [facility] [address] [place], [place] [id number] Maurine Simmering MDPhD [id number]   Flow Cytometry, Peripheral Blood (Oncology)     Status: None   Collection Time: 04/13/23  2:47 PM  Result Value Ref Range   Flow Cytometry SEE SEPARATE REPORT     Comment: Performed at [facility], [address] [place], [place] [address]  Iron and Iron Binding Capacity ([facility] only)     Status: Abnormal    Collection Time: 04/13/23  2:47 PM  Result Value Ref Range  Iron 24 (L) 28 - 170 ug/dL   TIBC 098 (H) 119 - 147 ug/dL   Saturation Ratios 4 (L) 10.4 - 31.8 %   UIBC 609 (H) 148 - 442 ug/dL    Comment: Performed at [facility], [address] [place], [place] [address]  CMP (Cancer Center only)     Status: None   Collection Time: 04/13/23  2:47 PM  Result Value Ref Range   Sodium 139 135 - 145 mmol/L   Potassium 4.0 3.5 - 5.1 mmol/L   Chloride 106 98 - 111 mmol/L   CO2 25 22 - 32 mmol/L   Glucose, Bld 86 70 - 99 mg/dL    Comment: Glucose reference range applies only to samples taken after fasting for at least 8 hours.   BUN 14 6 - 20 mg/dL   Creatinine 2.13 0.86 - 1.00 mg/dL   Calcium 9.6 8.9 - 57.8 mg/dL   Total Protein 7.9 6.5 - 8.1 g/dL   Albumin 4.7 3.5 - 5.0 g/dL   AST 24 15 - 41 U/L   ALT 24 0 - 44 U/L   Alkaline Phosphatase 58 38 - 126 U/L   Total Bilirubin 0.3 0.0 - 1.2 mg/dL   GFR, Estimated >46 >96 mL/min    Comment: (NOTE) Calculated using the CKD-EPI Creatinine Equation (2021)    Anion gap 8 5 - 15    Comment: Performed at [facility], [address] [place], [place] [address]  CBC with Differential (Cancer Center Only)     Status: Abnormal   Collection Time: 04/13/23  2:47 PM  Result Value Ref Range   WBC Count 12.4 (H) 4.0 - 10.5 K/uL   RBC 5.06 3.87 - 5.11 MIL/uL   Hemoglobin 10.8 (L) 12.0 - 15.0 g/dL    Comment: Reticulocyte Hemoglobin testing may be clinically indicated, consider ordering this additional test [id number]    HCT 35.0 (L) 36.0 - 46.0 %   MCV 69.2 (L) 80.0 - 100.0 fL   MCH 21.3 (L) 26.0 - 34.0 pg   MCHC 30.9 30.0 - 36.0 g/dL   RDW 02.7 (H) 25.3 - 66.4 %   Platelet Count 683 (H) 150 - 400 K/uL   nRBC 0.0 0.0 - 0.2 %   Neutrophils Relative % 46 %   Neutro Abs 5.7 1.7 - 7.7 K/uL   Lymphocytes Relative 41 %   Lymphs Abs 5.0 (H) 0.7 - 4.0 K/uL   Monocytes Relative 7 %   Monocytes  Absolute 0.9 0.1 - 1.0 K/uL   Eosinophils Relative 5 %   Eosinophils Absolute 0.6 (H) 0.0 - 0.5 K/uL   Basophils Relative 1 %   Basophils Absolute 0.1 0.0 - 0.1 K/uL   WBC Morphology FEW VARIANT LYMPHS    Smear Review Normal platelet morphology    Immature Granulocytes 0 %   Abs Immature Granulocytes 0.03 0.00 - 0.07 K/uL   Ovalocytes PRESENT     Comment: Performed at [facility], [address], [place], [place] [address]  CALR +MPL + E12-E15 (reflexed)     Status: None   Collection Time: 04/13/23  2:47 PM  Result Value Ref Range   CALR Result Comment     Comment: (NOTE) NEGATIVE No insertions or deletions were detected within the analyzed region of the calreticulin (CALR) gene. A negative result does not entirely exclude the possibility of a clonal population carrying CALR gene mutations that are not covered by this  assay. Results should be interpreted in conjunction with clinical and laboratory findings for the most accurate interpretation.    MPL Result Comment     Comment: (NOTE) NEGATIVE No MPL mutation was identified in the provided specimen of this individual. Results should be interpreted in conjunction with clinical and other laboratory findings for the most accurate interpretation.    E12-15 Result Comment     Comment: (NOTE) NEGATIVE    JAK2 mutations were not detected in exons 12, 13, 14 and 15. The G to T nucleotide change encoding the V617F mutation was not detected. This result does not rule out the presence of JAK2 mutation at a level below the detection sensitivity of this assay, the presence of other mutations outside the analyzed region of the JAK2 gene, or the presence of a myeloproliferative or other neoplasm. Result must be correlated with other clinical data for the most accurate diagnosis. Performed At: [facility] [address] [place], [place] [id number] Maurine Simmering MDPhD [id number]   BCR-ABL1 FISH      Status: None   Collection Time: 04/13/23  2:49 PM  Result Value Ref Range   Specimen Type BLOOD    Cells Counted 200    Cells Analyzed 200    FISH Result ABL1 GENE FUSION     Comment: Comment: NO BCR    Interpretation Comment:     Comment: (NOTE) NEGATIVE             nuc ish 9q34(ASS1,ABL1)x2,22q11.2(BCRx2)[200].      The fluorescence in situ hybridization (FISH) study was normal. FISH, using unique sequence DNA probes for the ABL1 and BCR gene regions showed two ABL1 signals (red), two control ASS1 gene signals (aqua) located adjacent to the ABL1 locus at 9q34, and two BCR signals (green) at 22q11.2 in all interphase nuclei examined. There was NO evidence of CML or ALL-associated BCR::ABL1 dual fusion signals in this analysis. .      This analysis is limited to abnormalities detectable by the specific probes included in the study. FISH results should be interpreted within the context of a full cytogenetic analysis and pathology evaluation.  A BCR::ABL1 gene fusion in greater than 3 interphase nuclei in a patient with a new clinical diagnosis is considered positive. The DNA probe vendor for this study was [employer] worker, community). .      This test was developed and its performance characteristics d etermined by [employer] ([facility]). It has not been cleared or approved by the U.S. Food and Drug Administration.    Director Review: Comment:     Comment: (NOTE) Roel Cluck, PhD, [facility] Performed At: [facility] [address] [place], [place] [id number] Maurine Simmering MDPhD [id number]   Ferritin     Status: Abnormal   Collection Time: 04/13/23  2:49 PM  Result Value Ref Range   Ferritin 4 (L) 11 - 307 ng/mL    Comment: Performed at [facility], [address], [place], [place] [address]  Lactate dehydrogenase     Status: None   Collection Time: 04/13/23  2:49 PM  Result Value Ref Range   LDH 190 98 - 192  U/L    Comment: Performed at [facility], [address] [place], [place] [address]  POC CBG, ED     Status: Abnormal   Collection Time: 04/18/23  4:25 PM  Result Value Ref Range   Glucose-Capillary 138 (H) 70 - 99  mg/dL    Comment: Glucose reference range applies only to samples taken after fasting for at least 8 hours.  CBC with Differential     Status: Abnormal   Collection Time: 04/18/23  4:37 PM  Result Value Ref Range   WBC 10.7 (H) 4.0 - 10.5 K/uL   RBC 4.42 3.87 - 5.11 MIL/uL   Hemoglobin 9.4 (L) 12.0 - 15.0 g/dL   HCT 16.1 (L) 09.6 - 04.5 %   MCV 71.5 (L) 80.0 - 100.0 fL   MCH 21.3 (L) 26.0 - 34.0 pg   MCHC 29.7 (L) 30.0 - 36.0 g/dL   RDW 40.9 (H) 81.1 - 91.4 %   Platelets 554 (H) 150 - 400 K/uL   nRBC 0.0 0.0 - 0.2 %   Neutrophils Relative % 56 %   Neutro Abs 5.9 1.7 - 7.7 K/uL   Lymphocytes Relative 33 %   Lymphs Abs 3.6 0.7 - 4.0 K/uL   Monocytes Relative 5 %   Monocytes Absolute 0.5 0.1 - 1.0 K/uL   Eosinophils Relative 5 %   Eosinophils Absolute 0.5 0.0 - 0.5 K/uL   Basophils Relative 1 %   Basophils Absolute 0.1 0.0 - 0.1 K/uL   Immature Granulocytes 0 %   Abs Immature Granulocytes 0.04 0.00 - 0.07 K/uL    Comment: Performed at [facility], [address] [place], [place] [address]  Comprehensive metabolic panel     Status: Abnormal   Collection Time: 04/18/23  4:37 PM  Result Value Ref Range   Sodium 138 135 - 145 mmol/L   Potassium 4.0 3.5 - 5.1 mmol/L   Chloride 107 98 - 111 mmol/L   CO2 22 22 - 32 mmol/L   Glucose, Bld 134 (H) 70 - 99 mg/dL    Comment: Glucose reference range applies only to samples taken after fasting for at least 8 hours.   BUN 15 6 - 20 mg/dL   Creatinine, Ser 6.21 0.44 - 1.00 mg/dL   Calcium 8.9 8.9 - 30.8 mg/dL   Total Protein 7.1 6.5 - 8.1 g/dL   Albumin 3.8 3.5 - 5.0 g/dL   AST 33 15 - 41 U/L   ALT 33 0 - 44 U/L   Alkaline Phosphatase 55 38 - 126 U/L   Total Bilirubin 0.4 0.0 -  1.2 mg/dL   GFR, Estimated >65 >78 mL/min    Comment: (NOTE) Calculated using the CKD-EPI Creatinine Equation (2021)    Anion gap 9 5 - 15    Comment: Performed at [facility], [address] [place], [place] [address]  POCT A1C     Status: Normal   Collection Time: 05/16/23  3:45 PM  Result Value Ref Range   Hemoglobin A1C 5.5 4.0 - 5.6 %   HbA1c POC (<> result, manual entry)     HbA1c, POC (prediabetic range)     HbA1c, POC (controlled diabetic range)       RADIOGRAPHIC STUDIES:  I have personally reviewed the radiological images as listed and agree with the findings in the report.  DG Chest Portable 1 View Result Date: 04/18/2023 CLINICAL DATA:  Syncope. EXAM: PORTABLE CHEST 1 VIEW COMPARISON:  Chest radiograph dated 11/10/2022. FINDINGS: The heart size and mediastinal contours are within normal limits. Both lungs are clear. The visualized skeletal structures are unremarkable. IMPRESSION: No active disease. Electronically Signed   By: Elgie Collard M.D.   On: 04/18/2023 17:31    Orders Placed This Encounter  Procedures  CBC with Differential (Cancer Center Only)    Standing Status:   Future    Expected Date:   07/13/2023    Expiration Date:   05/17/2024   Iron and Iron Binding Capacity ([facility] only)    Standing Status:   Future    Expected Date:   07/13/2023    Expiration Date:   05/17/2024   Ferritin    Standing Status:   Future    Expected Date:   07/13/2023    Expiration Date:   05/17/2024     Future Appointments  Date Time Provider Department Center  05/22/2023  3:00 PM [facility] 3 [facility] None  07/13/2023 11:15 AM [facility] None  07/13/2023 11:45 AM Yamil Dougher, Archie Patten, MD [facility] None    This document was completed utilizing speech recognition software. Grammatical errors, random word insertions, pronoun errors, and incomplete sentences are an occasional consequence of this system due to software limitations, ambient  noise, and hardware issues. Any formal questions or concerns about the content, text or information contained within the body of this dictation should be directly addressed to the provider for clarification.

## 2023-05-19 MED ORDER — INDAPAMIDE 1.25 MG PO TABS
1.2500 mg | ORAL_TABLET | Freq: Every day | ORAL | 1 refills | Status: DC
Start: 2023-05-19 — End: 2023-11-28

## 2023-05-22 ENCOUNTER — Ambulatory Visit (INDEPENDENT_AMBULATORY_CARE_PROVIDER_SITE_OTHER)

## 2023-05-22 ENCOUNTER — Other Ambulatory Visit (HOSPITAL_COMMUNITY): Payer: Self-pay

## 2023-05-22 ENCOUNTER — Other Ambulatory Visit: Payer: Self-pay | Admitting: Internal Medicine

## 2023-05-22 VITALS — BP 150/83 | HR 81 | Temp 98.4°F | Resp 18 | Ht 67.0 in | Wt 213.6 lb

## 2023-05-22 DIAGNOSIS — N920 Excessive and frequent menstruation with regular cycle: Secondary | ICD-10-CM | POA: Diagnosis not present

## 2023-05-22 DIAGNOSIS — D509 Iron deficiency anemia, unspecified: Secondary | ICD-10-CM

## 2023-05-22 DIAGNOSIS — D500 Iron deficiency anemia secondary to blood loss (chronic): Secondary | ICD-10-CM

## 2023-05-22 MED ORDER — SODIUM CHLORIDE 0.9 % IV SOLN
510.0000 mg | Freq: Once | INTRAVENOUS | Status: AC
  Administered 2023-05-22: 510 mg via INTRAVENOUS
  Filled 2023-05-22: qty 17

## 2023-05-22 MED ORDER — VALACYCLOVIR HCL 500 MG PO TABS
500.0000 mg | ORAL_TABLET | Freq: Every day | ORAL | 0 refills | Status: AC
  Filled 2023-05-22: qty 90, 90d supply, fill #0

## 2023-05-22 NOTE — Progress Notes (Signed)
 Diagnosis: Iron Deficiency Anemia  Provider:  Chilton Greathouse MD  Procedure: IV Infusion  IV Type: Peripheral, IV Location: R Forearm  Feraheme (Ferumoxytol), Dose: 510 mg  Infusion Start Time: 1530  Infusion Stop Time: 1547  Post Infusion IV Care: Observation period completed and Peripheral IV Discontinued  Discharge: Condition: Good, Destination: Home . AVS Declined  Performed by:  Adriana Mccallum, RN

## 2023-05-23 ENCOUNTER — Other Ambulatory Visit (HOSPITAL_COMMUNITY): Payer: Self-pay

## 2023-05-23 ENCOUNTER — Other Ambulatory Visit: Payer: Self-pay

## 2023-05-23 ENCOUNTER — Telehealth: Payer: Self-pay

## 2023-05-23 DIAGNOSIS — Z6833 Body mass index (BMI) 33.0-33.9, adult: Secondary | ICD-10-CM | POA: Diagnosis not present

## 2023-05-23 DIAGNOSIS — N840 Polyp of corpus uteri: Secondary | ICD-10-CM | POA: Diagnosis not present

## 2023-05-23 DIAGNOSIS — Z9189 Other specified personal risk factors, not elsewhere classified: Secondary | ICD-10-CM | POA: Diagnosis not present

## 2023-05-23 DIAGNOSIS — Z30430 Encounter for insertion of intrauterine contraceptive device: Secondary | ICD-10-CM | POA: Diagnosis not present

## 2023-05-23 DIAGNOSIS — E669 Obesity, unspecified: Secondary | ICD-10-CM | POA: Diagnosis not present

## 2023-05-23 DIAGNOSIS — D500 Iron deficiency anemia secondary to blood loss (chronic): Secondary | ICD-10-CM | POA: Diagnosis not present

## 2023-05-23 DIAGNOSIS — F419 Anxiety disorder, unspecified: Secondary | ICD-10-CM | POA: Diagnosis not present

## 2023-05-23 DIAGNOSIS — N939 Abnormal uterine and vaginal bleeding, unspecified: Secondary | ICD-10-CM | POA: Diagnosis not present

## 2023-05-23 DIAGNOSIS — G4730 Sleep apnea, unspecified: Secondary | ICD-10-CM | POA: Diagnosis not present

## 2023-05-23 NOTE — Telephone Encounter (Signed)
 Pharmacy Patient Advocate Encounter   Received notification from CoverMyMeds that prior authorization for Zepbound 2.5MG /0.5ML pen-injectors is required/requested.   Insurance verification completed.   The patient is insured through [facility] .   Per test claim: PA required; PA started via CoverMyMeds. KEY [id number] . Waiting for clinical questions to populate.

## 2023-05-26 ENCOUNTER — Other Ambulatory Visit: Payer: Self-pay

## 2023-05-31 ENCOUNTER — Telehealth: Payer: Self-pay

## 2023-05-31 ENCOUNTER — Other Ambulatory Visit (HOSPITAL_COMMUNITY): Payer: Self-pay

## 2023-05-31 NOTE — Telephone Encounter (Signed)
 Pt not found, [employer] form being used, pt currently has [employer], rechose form and started PA in separate encounter. Signing off on this one.

## 2023-05-31 NOTE — Telephone Encounter (Signed)
 Pharmacy Patient Advocate Encounter   Received notification from CoverMyMeds that prior authorization for Zepbound 2.5MG /0.5ML pen-injectors is required/requested.   Insurance verification completed.   The patient is insured through [facility] .   Per test claim: PA required; PA submitted to above mentioned insurance via CoverMyMeds Key/confirmation #/EOC [employer] Status is pending

## 2023-06-01 NOTE — Telephone Encounter (Signed)
 Pharmacy Patient Advocate Encounter  Received notification from [facility] that Prior Authorization for Zepbound 2.5mg /0.20ml has been DENIED.  Full denial letter will be uploaded to the media tab. See denial reason below.   PA #/Case ID/Reference #: [id number]

## 2023-06-02 NOTE — Telephone Encounter (Signed)
 Patient has been made aware.

## 2023-06-12 ENCOUNTER — Institutional Professional Consult (permissible substitution): Admitting: Neurology

## 2023-06-21 ENCOUNTER — Telehealth: Payer: Self-pay

## 2023-06-21 NOTE — Telephone Encounter (Signed)
 Copied from CRM [id number]. Topic: Clinical - Prescription Issue >> Jun 20, 2023  4:57 PM Star [place] wrote: Reason for CRM: [employer] with [employer]-  tirzepatide  (ZEPBOUND ) 2.5 MG/0.5ML Pen- auth denied- not covered for weight loss- please call  [phone] press 3 and then 3 again to be connected to authorization department

## 2023-07-05 ENCOUNTER — Other Ambulatory Visit (HOSPITAL_COMMUNITY): Payer: Self-pay

## 2023-07-05 ENCOUNTER — Telehealth: Payer: Self-pay

## 2023-07-05 ENCOUNTER — Encounter (HOSPITAL_COMMUNITY): Payer: Self-pay

## 2023-07-05 ENCOUNTER — Other Ambulatory Visit: Payer: Self-pay | Admitting: Internal Medicine

## 2023-07-05 DIAGNOSIS — F331 Major depressive disorder, recurrent, moderate: Secondary | ICD-10-CM

## 2023-07-05 NOTE — Telephone Encounter (Signed)
 LVM for pt to bring CPAP Machine to Appointment on tomorrow.

## 2023-07-06 ENCOUNTER — Institutional Professional Consult (permissible substitution): Admitting: Neurology

## 2023-07-06 NOTE — Telephone Encounter (Signed)
 Pt has called back to r/s the cx appointment

## 2023-07-12 DIAGNOSIS — N840 Polyp of corpus uteri: Secondary | ICD-10-CM | POA: Diagnosis not present

## 2023-07-12 DIAGNOSIS — N951 Menopausal and female climacteric states: Secondary | ICD-10-CM | POA: Diagnosis not present

## 2023-07-12 DIAGNOSIS — Z09 Encounter for follow-up examination after completed treatment for conditions other than malignant neoplasm: Secondary | ICD-10-CM | POA: Diagnosis not present

## 2023-07-12 DIAGNOSIS — D500 Iron deficiency anemia secondary to blood loss (chronic): Secondary | ICD-10-CM | POA: Diagnosis not present

## 2023-07-13 ENCOUNTER — Encounter: Payer: Self-pay | Admitting: Oncology

## 2023-07-13 ENCOUNTER — Ambulatory Visit: Payer: BC Managed Care – PPO | Admitting: Oncology

## 2023-07-13 ENCOUNTER — Inpatient Hospital Stay (HOSPITAL_BASED_OUTPATIENT_CLINIC_OR_DEPARTMENT_OTHER): Admitting: Oncology

## 2023-07-13 ENCOUNTER — Other Ambulatory Visit: Payer: BC Managed Care – PPO

## 2023-07-13 ENCOUNTER — Inpatient Hospital Stay: Attending: Oncology

## 2023-07-13 VITALS — BP 122/83 | HR 72 | Temp 98.1°F | Resp 19 | Wt 219.4 lb

## 2023-07-13 DIAGNOSIS — Z79899 Other long term (current) drug therapy: Secondary | ICD-10-CM | POA: Diagnosis not present

## 2023-07-13 DIAGNOSIS — Z888 Allergy status to other drugs, medicaments and biological substances status: Secondary | ICD-10-CM | POA: Insufficient documentation

## 2023-07-13 DIAGNOSIS — N920 Excessive and frequent menstruation with regular cycle: Secondary | ICD-10-CM | POA: Diagnosis not present

## 2023-07-13 DIAGNOSIS — D500 Iron deficiency anemia secondary to blood loss (chronic): Secondary | ICD-10-CM

## 2023-07-13 DIAGNOSIS — D72820 Lymphocytosis (symptomatic): Secondary | ICD-10-CM | POA: Diagnosis not present

## 2023-07-13 DIAGNOSIS — D75839 Thrombocytosis, unspecified: Secondary | ICD-10-CM | POA: Diagnosis not present

## 2023-07-13 DIAGNOSIS — Z793 Long term (current) use of hormonal contraceptives: Secondary | ICD-10-CM | POA: Diagnosis not present

## 2023-07-13 DIAGNOSIS — D72829 Elevated white blood cell count, unspecified: Secondary | ICD-10-CM

## 2023-07-13 DIAGNOSIS — G4730 Sleep apnea, unspecified: Secondary | ICD-10-CM | POA: Diagnosis not present

## 2023-07-13 LAB — CBC WITH DIFFERENTIAL (CANCER CENTER ONLY)
Abs Immature Granulocytes: 0.04 10*3/uL (ref 0.00–0.07)
Basophils Absolute: 0.1 10*3/uL (ref 0.0–0.1)
Basophils Relative: 1 %
Eosinophils Absolute: 0.7 10*3/uL — ABNORMAL HIGH (ref 0.0–0.5)
Eosinophils Relative: 6 %
HCT: 42 % (ref 36.0–46.0)
Hemoglobin: 14 g/dL (ref 12.0–15.0)
Immature Granulocytes: 0 %
Lymphocytes Relative: 37 %
Lymphs Abs: 4.2 10*3/uL — ABNORMAL HIGH (ref 0.7–4.0)
MCH: 26.6 pg (ref 26.0–34.0)
MCHC: 33.3 g/dL (ref 30.0–36.0)
MCV: 79.7 fL — ABNORMAL LOW (ref 80.0–100.0)
Monocytes Absolute: 0.8 10*3/uL (ref 0.1–1.0)
Monocytes Relative: 7 %
Neutro Abs: 5.5 10*3/uL (ref 1.7–7.7)
Neutrophils Relative %: 49 %
Platelet Count: 450 10*3/uL — ABNORMAL HIGH (ref 150–400)
RBC: 5.27 MIL/uL — ABNORMAL HIGH (ref 3.87–5.11)
RDW: 23.7 % — ABNORMAL HIGH (ref 11.5–15.5)
WBC Count: 11.2 10*3/uL — ABNORMAL HIGH (ref 4.0–10.5)
nRBC: 0 % (ref 0.0–0.2)

## 2023-07-13 LAB — IRON AND IRON BINDING CAPACITY (CC-WL,HP ONLY)
Iron: 106 ug/dL (ref 28–170)
Saturation Ratios: 26 % (ref 10.4–31.8)
TIBC: 405 ug/dL (ref 250–450)
UIBC: 299 ug/dL (ref 148–442)

## 2023-07-13 LAB — FERRITIN: Ferritin: 74 ng/mL (ref 11–307)

## 2023-07-13 MED ORDER — FERROUS SULFATE 325 (65 FE) MG PO TBEC: 325.0000 mg | DELAYED_RELEASE_TABLET | Freq: Every day | ORAL | 3 refills | Status: AC

## 2023-07-13 NOTE — Assessment & Plan Note (Signed)
 Chronic thrombocytosis with a history of high platelet count. Previous workup in August 2020 showed no evidence of JAK2, CALR, MPL, exon 12-15 mutations.  BCR/ABL 1 was negative.   Current high platelet count may be related to iron deficiency due to menorrhagia.   On her consultation with us  on 04/13/2023, labs reveal persistent thrombocytosis with platelet count of 683,000.  Hemoglobin 10.8, MCV 69.  White count 12,400 with mild lymphocytosis of 5000.  Repeat JAK2 mutation analysis, CALR, MPL, exon 12-15 mutation analysis were all negative.  BCR/ABL 1 was negative.  Ferritin decreased at 4.  Iron studies indicated evidence of iron deficiency with iron saturation of 4%, iron decreased to 24, increased iron binding capacity of 633.  We proceeded with IV iron using Feraheme x 2 doses.  Platelet count improved to 450,000 on 07/13/2023.  Hemoglobin normal at 14.  - Schedule in-person follow-up in 4 months

## 2023-07-13 NOTE — Assessment & Plan Note (Addendum)
 Iron deficiency anemia likely secondary to menorrhagia. Reports heavy cycles every 28 days. Iron levels are low, contributing to high platelet count. Discussed that IV iron infusion can help improve iron levels and reduce platelet count. Explained that iron infusion should last at least three months, after which re-evaluation will be done.  Hemoglobin 10.8, MCV 69 on 04/13/23.  Iron studies indicate iron deficiency.  We treated her with Feraheme weekly x 2 doses.  She had an IUD placed recently and menorrhagia has improved.  Labs today showed much improved hemoglobin of 14, MCV now normal.  Iron saturation rose from 4% to 26%. These improvements are attributed to iron supplementation and IUD insertion to control bleeding.   She was prescribed ferrous sulfate 325 mg, to be taken once daily.

## 2023-07-13 NOTE — Progress Notes (Signed)
 [facility]  HEMATOLOGY CLINIC PROGRESS NOTE  PATIENT NAME: Jennifer Mcknight   MR#: [id number] DOB: 12-22-1970  Patient Care Team: Arcadio Knuckles, MD as PCP - General  Date of visit: 07/13/2023   ASSESSMENT & PLAN:   Jennifer Mcknight is a 53 y.o.  lady with past medical history of menorrhagia, sleep apnea, was referred to our service in February 2025 for evaluation of thrombocytosis.    Thrombocytosis Chronic thrombocytosis with a history of high platelet count. Previous workup in August 2020 showed no evidence of JAK2, CALR, MPL, exon 12-15 mutations.  BCR/ABL 1 was negative.   Current high platelet count may be related to iron deficiency due to menorrhagia.   On her consultation with us  on 04/13/2023, labs reveal persistent thrombocytosis with platelet count of 683,000.  Hemoglobin 10.8, MCV 69.  White count 12,400 with mild lymphocytosis of 5000.  Repeat JAK2 mutation analysis, CALR, MPL, exon 12-15 mutation analysis were all negative.  BCR/ABL 1 was negative.  Ferritin decreased at 4.  Iron studies indicated evidence of iron deficiency with iron saturation of 4%, iron decreased to 24, increased iron binding capacity of 633.  We proceeded with IV iron using Feraheme x 2 doses.  Platelet count improved to 450,000 on 07/13/2023.  Hemoglobin normal at 14.  - Schedule in-person follow-up in 4 months  Iron deficiency anemia due to chronic blood loss Iron deficiency anemia likely secondary to menorrhagia. Reports heavy cycles every 28 days. Iron levels are low, contributing to high platelet count. Discussed that IV iron infusion can help improve iron levels and reduce platelet count. Explained that iron infusion should last at least three months, after which re-evaluation will be done.  Hemoglobin 10.8, MCV 69 on 04/13/23.  Iron studies indicate iron deficiency.  We treated her with Feraheme weekly x 2 doses.  She had an IUD placed recently and menorrhagia has improved.  Labs  today showed much improved hemoglobin of 14, MCV now normal.  Iron saturation rose from 4% to 26%. These improvements are attributed to iron supplementation and IUD insertion to control bleeding.   She was prescribed ferrous sulfate 325 mg, to be taken once daily.   I spent a total of 20 minutes during this encounter with the patient including review of chart and various tests results, discussions about plan of care and coordination of care plan.  I reviewed lab results and outside records for this visit and discussed relevant results with the patient. Diagnosis, plan of care and treatment options were also discussed in detail with the patient. Opportunity provided to ask questions and answers provided to her apparent satisfaction. Provided instructions to call our clinic with any problems, questions or concerns prior to return visit. I recommended to continue follow-up with PCP and sub-specialists. She verbalized understanding and agreed with the plan. No barriers to learning was detected.  Arlo Berber, MD  07/13/2023 12:57 PM  [facility] - A DEPT OF [facility]. [facility] [address] [place] [address] Dept: [phone] Dept Fax: [phone]   CHIEF COMPLAINT/ REASON FOR VISIT:  Follow-up for thrombocytosis and intermittent leukocytosis, reactionary.  INTERVAL HISTORY:  Discussed the use of AI scribe software for clinical note transcription with the patient, who gave verbal consent to proceed.  History of Present Illness Jennifer Mcknight is a 53 year old female who presents for follow-up after iron infusions and IUD placement.  She received two doses of  iron, which improved her hemoglobin levels from 9.4 to 14 and increased her mean corpuscular volume from 69 to 79.7, indicating a positive response to treatment.  An IUD was inserted in April to help control her bleeding. Her platelet count, previously elevated at 683,000, has  decreased to 450,000, now at the upper limit of normal.  She mentions a previous prescription for oral iron that was prohibitively expensive, costing $1,000, and is considering alternative options for iron supplementation.  She has a sleep study scheduled for June, which she hopes will improve her overall health and well-being.   SUMMARY OF HEMATOLOGIC HISTORY:  On her follow-up visit with her PCP Dr. Rochelle Chu on 01/16/2023, she was found to have elevated platelet count at 647,000.  Previously her platelet count was 695,000 in September 2024.  Given persistent thrombocytosis, referral was sent to us  for further evaluation.  She was also found to have mild leukocytosis with white count of 14,700, anemia with hemoglobin of 10.2 in November 2024.   She reports recent rapid weight gain and heavy menstrual cycles, which occur every 28 days. The patient is currently seeing a gynecologist for these issues and has been prescribed progesterone to manage the heavy bleeding. The patient also mentions a potential growth in the uterus, which is currently being investigated by her gynecologist.   In November 2020, the patient underwent a gallbladder removal surgery due to gallstones, which she does not recall. The patient also has sleep apnea and is due for a sleep study.   She did not have prior splenectomy. She had no prior history or diagnosis of cancer. Her age appropriate screening programs are up-to-date. Patient has never been diagnosed with thrombotic events.   Chronic thrombocytosis with a history of high platelet count. Previous workup in August 2020 showed no evidence of JAK2, CALR, MPL, exon 12-15 mutations.  BCR/ABL 1 was negative.   Current high platelet count may be related to iron deficiency due to menorrhagia.   On her consultation with us  on 04/13/2023, labs reveal persistent thrombocytosis with platelet count of 683,000.  Hemoglobin 10.8, MCV 69.  White count 12,400 with mild lymphocytosis of  5000.  Repeat JAK2 mutation analysis, CALR, MPL, exon 12-15 mutation analysis were all negative.  BCR/ABL 1 was negative.  Ferritin decreased at 4.  Iron studies indicated evidence of iron deficiency with iron saturation of 4%, iron decreased to 24, increased iron binding capacity of 633.   We proceeded with IV iron using Feraheme x 2 doses.  Platelet count improved to 450,000 on 07/13/2023.  Hemoglobin normal at 14.   Chronic intermittent leukocytosis.  Flow cytometry of peripheral blood was unremarkable on 04/13/2023.  BCR/ABL 1 negative.  No evidence of primary myeloproliferative neoplasm.  Will continue to monitor this.  I have reviewed the past medical history, past surgical history, social history and family history with the patient and they are unchanged from previous note.  ALLERGIES: She is allergic to cymbalta  [duloxetine  hcl].  MEDICATIONS:  Current Outpatient Medications  Medication Sig Dispense Refill   ferrous sulfate 325 (65 FE) MG EC tablet Take 1 tablet (325 mg total) by mouth daily with breakfast. 90 tablet 3   indapamide  (LOZOL ) 1.25 MG tablet Take 1 tablet (1.25 mg total) by mouth daily. 90 tablet 1   levonorgestrel (MIRENA) 20 MCG/DAY IUD 1 each by Intrauterine route once.     Multiple Vitamin (MULTIVITAMIN WITH MINERALS) TABS tablet Take 1 tablet by mouth daily.     Vilazodone  HCl (VIIBRYD )  40 MG TABS TAKE ONE TABLET BY MOUTH ONE TIME DAILY 90 tablet 0   valACYclovir  (VALTREX ) 500 MG tablet Take 1 tablet (500 mg total) by mouth daily. (Patient not taking: Reported on 07/13/2023) 90 tablet 0   No current facility-administered medications for this visit.     REVIEW OF SYSTEMS:    Review of Systems - Oncology  All other pertinent systems were reviewed with the patient and are negative.  PHYSICAL EXAMINATION:    Onc Performance Status - 07/13/23 1148       ECOG Perf Status   ECOG Perf Status Fully active, able to carry on all pre-disease performance without  restriction      KPS SCALE   KPS % SCORE Normal, no compliants, no evidence of disease             Vitals:   07/13/23 1134  BP: 122/83  Pulse: 72  Resp: 19  Temp: 98.1 F (36.7 C)  SpO2: 98%   Filed Weights   07/13/23 1134  Weight: 219 lb 6.4 oz (99.5 kg)    Physical Exam Constitutional:      General: She is not in acute distress.    Appearance: Normal appearance.  HENT:     Head: Normocephalic and atraumatic.  Cardiovascular:     Rate and Rhythm: Normal rate.  Pulmonary:     Effort: Pulmonary effort is normal. No respiratory distress.  Abdominal:     General: There is no distension.  Neurological:     General: No focal deficit present.     Mental Status: She is alert and oriented to person, place, and time.  Psychiatric:        Mood and Affect: Mood normal.        Behavior: Behavior normal.     LABORATORY DATA:   I have reviewed the data as listed.  Results for orders placed or performed in visit on 07/13/23  Iron and Iron Binding Capacity ([facility] only)  Result Value Ref Range   Iron 106 28 - 170 ug/dL   TIBC 161 096 - 045 ug/dL   Saturation Ratios 26 10.4 - 31.8 %   UIBC 299 148 - 442 ug/dL  CBC with Differential (Cancer Center Only)  Result Value Ref Range   WBC Count 11.2 (H) 4.0 - 10.5 K/uL   RBC 5.27 (H) 3.87 - 5.11 MIL/uL   Hemoglobin 14.0 12.0 - 15.0 g/dL   HCT 40.9 81.1 - 91.4 %   MCV 79.7 (L) 80.0 - 100.0 fL   MCH 26.6 26.0 - 34.0 pg   MCHC 33.3 30.0 - 36.0 g/dL   RDW 78.2 (H) 95.6 - 21.3 %   Platelet Count 450 (H) 150 - 400 K/uL   nRBC 0.0 0.0 - 0.2 %   Neutrophils Relative % 49 %   Neutro Abs 5.5 1.7 - 7.7 K/uL   Lymphocytes Relative 37 %   Lymphs Abs 4.2 (H) 0.7 - 4.0 K/uL   Monocytes Relative 7 %   Monocytes Absolute 0.8 0.1 - 1.0 K/uL   Eosinophils Relative 6 %   Eosinophils Absolute 0.7 (H) 0.0 - 0.5 K/uL   Basophils Relative 1 %   Basophils Absolute 0.1 0.0 - 0.1 K/uL   Immature Granulocytes 0 %   Abs Immature  Granulocytes 0.04 0.00 - 0.07 K/uL    RADIOGRAPHIC STUDIES:  No recent pertinent imaging studies available to review.  Orders Placed This Encounter  Procedures   CBC with Differential (Cancer Center Only)  Standing Status:   Future    Expected Date:   11/08/2023    Expiration Date:   07/12/2024   Iron and Iron Binding Capacity ([facility] only)    Standing Status:   Future    Expected Date:   11/08/2023    Expiration Date:   07/12/2024   Ferritin    Standing Status:   Future    Expected Date:   11/08/2023    Expiration Date:   07/12/2024     Future Appointments  Date Time Provider Department Center  08/07/2023  3:15 PM [name], MD [facility] None  11/08/2023 11:00 AM [facility] None  11/08/2023 11:30 AM Quinnie Barcelo, Gale Jude, MD [facility] None     This document was completed utilizing speech recognition software. Grammatical errors, random word insertions, pronoun errors, and incomplete sentences are an occasional consequence of this system due to software limitations, ambient noise, and hardware issues. Any formal questions or concerns about the content, text or information contained within the body of this dictation should be directly addressed to the provider for clarification.

## 2023-07-21 DIAGNOSIS — Z30431 Encounter for routine checking of intrauterine contraceptive device: Secondary | ICD-10-CM | POA: Diagnosis not present

## 2023-07-21 DIAGNOSIS — N951 Menopausal and female climacteric states: Secondary | ICD-10-CM | POA: Diagnosis not present

## 2023-08-07 ENCOUNTER — Ambulatory Visit (INDEPENDENT_AMBULATORY_CARE_PROVIDER_SITE_OTHER): Admitting: Neurology

## 2023-08-07 ENCOUNTER — Encounter: Payer: Self-pay | Admitting: Neurology

## 2023-08-07 VITALS — BP 129/82 | HR 69 | Ht 67.0 in | Wt 223.4 lb

## 2023-08-07 DIAGNOSIS — R351 Nocturia: Secondary | ICD-10-CM | POA: Diagnosis not present

## 2023-08-07 DIAGNOSIS — G4719 Other hypersomnia: Secondary | ICD-10-CM | POA: Diagnosis not present

## 2023-08-07 DIAGNOSIS — G4733 Obstructive sleep apnea (adult) (pediatric): Secondary | ICD-10-CM

## 2023-08-07 DIAGNOSIS — R635 Abnormal weight gain: Secondary | ICD-10-CM

## 2023-08-07 DIAGNOSIS — E66811 Obesity, class 1: Secondary | ICD-10-CM

## 2023-08-07 NOTE — Progress Notes (Signed)
 Subjective:    Patient ID: Jennifer Mcknight is a 53 y.o. female.  HPI    Jennifer Fairy, MD, PhD [facility] [address], [address] [address] [place], [place] [address]  Dear Dr. Rochelle Mcknight,  I saw your patient, Jennifer Mcknight, upon your kind request in my sleep clinic today for evaluation of her sleep disorder, in particular, reevaluation of her prior diagnosis of obstructive sleep apnea.  The patient is unaccompanied today.  She missed an appointment on 06/12/2023.  As you know, Jennifer Mcknight is a 53 year old female with an underlying medical history of elevated liver enzymes, headaches, abnormal uterine bleeding, prediabetes, iron deficiency anemia, urinary urgency, thrombocytosis, leukocytosis, depression, and obesity, who was previously diagnosed with obstructive sleep apnea and placed on PAP therapy.  I had evaluated her in 2018.  Unfortunately, she has not been seen in our sleep clinic since 2018.  She is currently no longer on PAP therapy.  She found it difficult to tolerate, particularly the mask.   She reports snoring and excessive daytime somnolence as well as witnessed apneas.  Her Epworth sleepiness score is 15 out of 24, fatigue severity score is 63 out of 63.  She has had weight gain since her original sleep apnea diagnosis.  She is working on weight loss, compared to 2018 her weight is about 25 pounds higher.  She has had worsening daytime somnolence and witnessed apneas per family.  She lives with her husband and 2 children, daughter age 75 and son age 23 who is going to college in [place] this fall.  She does not currently work.  Bedtime is around 11 and rise time around 10, she has trouble maintaining sleep.  She has nocturia about once per average night, denies recurrent morning headaches but is currently also taking Advil for heavy menstrual bleeding and does not notice any headaches.  She quit smoking over 10 years ago.  She drinks caffeine in the form of tea and coffee and  soda, altogether about 3 servings per day.  She drinks alcohol occasionally.  Her sister has sleep apnea.  Previously:   07/28/16: Jennifer Mcknight is a 53 year old right-handed woman with an underlying medical history of obesity, and anxiety, who presents for follow-up consultation of her obstructive sleep apnea, after her recent CPAP titration study. The patient is unaccompanied today. I first met her on 04/21/2016 at the request of her primary care physician, at which time she reported a prior diagnosis of OSA. Her baseline sleep study results from 07/14/2015 showed an AHI of 6 per hour, O2 nadir of 89%. She was advised to return for a CPAP titration study. She had sleep-related complaints of snoring and excessive daytime somnolence, difficulty with weight loss. She had seen weight management at [facility]. She had a CPAP titration study on 05/05/2016. I went over her test results with her in detail today. Sleep efficiency was 53.7%, sleep latency 25.5 minutes, REM latency 93 minutes. Wake after sleep onset was highly elevated at 202 minutes with an almost 3 hour period of wakefulness between 12:30 AM and 3:45 AM. CPAP was titrated from 5 cm to 7 cm. At a pressure of 7 cm her AHI was 0.4 per hour, nonsupine REM sleep achieved, O2 nadir of 92%. Based on her test results are prescribed CPAP therapy for home use.  I reviewed her CPAP compliance data from 06/27/2016 through 07/26/2016 which is a total of 30 days, during which time she used her machine only 20 days with  percent used days greater than 4 hours at 27% only, indicating poor compliance, average usage of 3 hours and 52 minutes only, residual AHI 2 per hour, leak low, pressure at 7 cm. In the past 90 days her compliance percentage was lower at 20%. She reports that she still adjusting to treatment and actually does feel improved when she uses CPAP. She gets tangled up with the hose sometimes. She is trying to lose weight, has in fact lost a few lb and feels,  she has more energy. Per daughter, seems less tired. No longer snores. Is motivated to continue.     04/21/2016: She was previously diagnosed with obstructive sleep apnea about a year ago with a sleep study. Prior sleep study results are not available for my review. I reviewed your office note from 04/14/2016. She was never treated with CPAP. She reports snoring and excessive daytime somnolence, and difficulty with her weight management. In fact, she started seeing weight management at [facility] and had some modest amount of weight loss but not sustained and in fact she has been gaining weight. She had the sleep study in the context of her weight management appointment. She does not have results her self but was told that she had moderate obstructive sleep apnea and that treatment with CPAP would be the next step. She did not have insurance in the interim but does not want to wait until she has insurance because she continues to feel bad and in fact she feels worse with respect to her daytime somnolence as well as her weight gain. Her Epworth sleepiness score is 15 out of 24, fatigue score is 54 out of 63. She limits her caffeine to 2 servings per day in the form of coffee, tea or soda. She tries to be in bed around 10 and wake up time is 6:30 but she does not wake up rested. She denies morning headaches but has nocturia once per night, almost always at 3 AM. She snores loudly. Her husband is a restless sleeper as well. She does not endorse any restless leg symptoms or leg twitching at night. She is not aware of any family history of OSA. 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 She has 2 children, a 39 year old daughter and 26 year old son. She often takes a nap before picking her son up from school. She quit smoking about a year ago. She drinks alcohol about every other week or so. She had sinus surgery in her 46s and her uvula was trimmed at the time.  We looked in her care  everywhere chart and it appears that she had a sleep study on 07/15/2015 but results are not visible for us . We will try to get records from [facility] and she will also look at home. She has been on Fetzima  for about 3 years, it has helped her mood.   04/21/2016, 1 PM: I reviewed sleep study results from 07/14/2015. Sleep efficiency was 80.4%, sleep latency was 68.5 minutes. Wake after sleep onset was 37 minutes, percentage of stage I was normal, percentage of stage II was normal, stage III was 24.7% and REM stage was 27.8%, mildly increased, REM latency 96 minutes, normal. Arousal index was mildly elevated at 19.7 per hour. Total AHI was in the mild range at 6.0 per hour, REM AHI was 4.7 per hour, supine AHI was 2.7 per hour, average oxygen saturation was 94%, nadir was 89%. PLM index was 12.8, PLM arousal index was  6.6 per hour. Patient overall had mild obstructive sleep apnea.      Her Past Medical History Is Significant For: Past Medical History:  Diagnosis Date   Depression    Elevated liver enzymes    Family history of adverse reaction to anesthesia    mother had PONV    Headache    rarely   PONV (postoperative nausea and vomiting)    Sleep apnea    no CPAP, per patient unable to tolerate    Her Past Surgical History Is Significant For: Past Surgical History:  Procedure Laterality Date   Anemia     BREAST SURGERY     CESAREAN SECTION     CHOLECYSTECTOMY N/A 12/31/2018   Procedure: LAPAROSCOPIC CHOLECYSTECTOMY;  Surgeon: Shela Derby, MD;  Location: [facility] OR;  Service: General;  Laterality: N/A;   MASTOPEXY     NASAL SINUS SURGERY     REDUCTION MAMMAPLASTY Bilateral    Breast Lift   TUBAL LIGATION      Her Family History Is Significant For: Family History  Problem Relation Age of Onset   Diabetes Father    Depression Father    Sleep apnea Sister    Alcohol abuse Other    Drug abuse Other    Depression Other    Cancer Neg Hx    Heart disease Neg Hx    Early death  Neg Hx    Hyperlipidemia Neg Hx    Hypertension Neg Hx    Kidney disease Neg Hx    Learning disabilities Neg Hx    Stroke Neg Hx     Her Social History Is Significant For: Social History   Socioeconomic History   Marital status: Married    Spouse name: Not on file   Number of children: Not on file   Years of education: Not on file   Highest education level: Not on file  Occupational History   Not on file  Tobacco Use   Smoking status: Former   Smokeless tobacco: Never  Vaping Use   Vaping status: Never Used  Substance and Sexual Activity   Alcohol use: Yes    Comment: rarely   Drug use: No   Sexual activity: Yes    Partners: Male    Birth control/protection: Surgical  Other Topics Concern   Not on file  Social History Narrative   Pt lives with family    Homemaker    Social Drivers of Health   Financial Resource Strain: Patient Declined (03/24/2023)   Received from [facility]   Overall Financial Resource Strain (CARDIA)    Difficulty of Paying Living Expenses: Patient declined  Food Insecurity: No Food Insecurity (04/13/2023)   Hunger Vital Sign    Worried About Running Out of Food in the Last Year: Never true    Ran Out of Food in the Last Year: Never true  Transportation Needs: No Transportation Needs (04/13/2023)   PRAPARE - Administrator, Civil Service (Medical): No    Lack of Transportation (Non-Medical): No  Physical Activity: Unknown (05/16/2023)   Exercise Vital Sign    Days of Exercise per Week: 1 day    Minutes of Exercise per Session: Not on file  Stress: Stress Concern Present (05/16/2023)   [employer] of Occupational Health - Occupational Stress Questionnaire    Feeling of Stress : To some extent  Social Connections: Unknown (05/16/2023)   Social Connection and Isolation Panel    Frequency of Communication with Friends and Family:  Not on file    Frequency of Social Gatherings with Friends and Family: Not on file    Attends  Religious Services: Not on file    Active Member of Clubs or Organizations: No    Attends Banker Meetings: Not on file    Marital Status: Not on file    Her Allergies Are:  Allergies  Allergen Reactions   Cymbalta  [Duloxetine  Hcl] Nausea And Vomiting  :   Her Current Medications Are:  Outpatient Encounter Medications as of 08/07/2023  Medication Sig   ferrous sulfate  325 (65 FE) MG EC tablet Take 1 tablet (325 mg total) by mouth daily with breakfast.   indapamide  (LOZOL ) 1.25 MG tablet Take 1 tablet (1.25 mg total) by mouth daily.   levonorgestrel (MIRENA) 20 MCG/DAY IUD 1 each by Intrauterine route once.   MAGNESIUM PO Take by mouth.   Multiple Vitamin (MULTIVITAMIN WITH MINERALS) TABS tablet Take 1 tablet by mouth daily.   valACYclovir  (VALTREX ) 500 MG tablet Take 1 tablet (500 mg total) by mouth daily.   Vilazodone  HCl (VIIBRYD ) 40 MG TABS TAKE ONE TABLET BY MOUTH ONE TIME DAILY   No facility-administered encounter medications on file as of 08/07/2023.  :   Review of Systems:  Out of a complete 14 point review of systems, all are reviewed and negative with the exception of these symptoms as listed below:  Review of Systems  Neurological:        Pt here for sleep consult P Pt snores,fatigue,few headaches Pt denies hypertension Pt states had sleep study  10+ years ago Pt states did have cpap machine misplaced machine    ESS:15 FSS:63     Objective:  Neurological Exam  Physical Exam Physical Examination:   Vitals:   08/07/23 1524  BP: 129/82  Pulse: 69    General Examination: The patient is a very pleasant 53 y.o. female in no acute distress. She appears well-developed and well-nourished and well groomed.   HEENT: Normocephalic, atraumatic, pupils are equal, round and reactive to light, extraocular tracking is good without limitation to gaze excursion or nystagmus noted. Hearing is grossly intact. Face is symmetric with normal facial animation. Speech  is clear with no dysarthria noted. There is no hypophonia. There is no lip, neck/head, jaw or voice tremor. Neck is supple with full range of passive and active motion. There are no carotid bruits on auscultation. Oropharynx exam reveals: mild mouth dryness, good dental hygiene and mild to moderate airway crowding, due to smaller airway entry, tonsils about 1+ bilaterally. Mallampati is class II. Tongue protrudes centrally and palate elevates symmetrically. Neck size is 15 3/4 inches. She has a Mild overbite.   Chest: Clear to auscultation without wheezing, rhonchi or crackles noted.  Heart: S1+S2+0, regular and normal without murmurs, rubs or gallops noted.   Abdomen: Soft, non-tender and non-distended.  Extremities: There is 1+ pitting edema in the distal lower extremities bilaterally.   Skin: Warm and dry without trophic changes noted.   Musculoskeletal: exam reveals no obvious joint deformities.   Neurologically:  Mental status: The patient is awake, alert and oriented in all 4 spheres. Her immediate and remote memory, attention, language skills and fund of knowledge are appropriate. There is no evidence of aphasia, agnosia, apraxia or anomia. Speech is clear with normal prosody and enunciation. Thought process is linear. Mood is normal and affect is normal.  Cranial nerves II - XII are as described above under HEENT exam.  Motor exam: Normal bulk,  strength and tone is noted. There is no obvious action or resting tremor.  Fine motor skills and coordination: grossly intact.  Cerebellar testing: No dysmetria or intention tremor. There is no truncal or gait ataxia.  Sensory exam: intact to light touch in the upper and lower extremities.  Gait, station and balance: She stands easily. No veering to one side is noted. No leaning to one side is noted. Posture is age-appropriate and stance is narrow based. Gait shows normal stride length and normal pace. No problems turning are noted.   Assessment  and Plan:  In summary, Jennifer Mcknight is a very pleasant 53 y.o.-year old female 53 year old female with an underlying medical history of elevated liver enzymes, headaches, abnormal uterine bleeding, prediabetes, iron deficiency anemia, urinary urgency, thrombocytosis, leukocytosis, depression, and obesity, who presents for evaluation of her obstructive sleep apnea.  She was diagnosed several years ago, she was evaluated by our sleep lab in 2018 and was briefly on PAP therapy in 2018 but no longer is on treatment.  She has experienced weight gain, she has had worsening daytime somnolence and witnessed apneas.  She would be willing to get reevaluated and consider treatment again.  While a laboratory attended sleep study is typically considered gold standard for evaluation of sleep disordered breathing, we mutually agreed to proceed with a home sleep test at this time.   I had a long chat with the patient about my findings and the diagnosis of sleep apnea , particularly OSA, its prognosis and treatment options. We talked about medical/conservative treatments, surgical interventions and non-pharmacological approaches for symptom control. I explained, in particular, the risks and ramifications of untreated moderate to severe OSA, especially with respect to developing cardiovascular disease down the road, including congestive heart failure (CHF), difficult to treat hypertension, cardiac arrhythmias (particularly A-fib), neurovascular complications including TIA, stroke and dementia. Even type 2 diabetes has, in part, been linked to untreated OSA. Symptoms of untreated OSA may include (but may not be limited to) daytime sleepiness, nocturia (i.e. frequent nighttime urination), memory problems, mood irritability and suboptimally controlled or worsening mood disorder such as depression and/or anxiety, lack of energy, lack of motivation, physical discomfort, as well as recurrent headaches, especially morning or nocturnal  headaches. We talked about the importance of maintaining a healthy lifestyle and striving for healthy weight.  I recommended a sleep study at this time. I outlined the differences between a laboratory attended sleep study which is considered more comprehensive and accurate over the option of a home sleep test (HST); the latter may lead to underestimation of sleep disordered breathing in some instances and does not help with diagnosing upper airway resistance syndrome and is not accurate enough to diagnose primary central sleep apnea typically. I outlined possible surgical and non-surgical treatment options of OSA, including the use of a positive airway pressure (PAP) device (i.e. CPAP, AutoPAP/APAP or BiPAP in certain circumstances), a custom-made dental device (aka oral appliance, which would require a referral to a specialist dentist or orthodontist typically, and is generally speaking not considered for patients with full dentures or edentulous state), upper airway surgical options, such as traditional UPPP (which is not considered a first-line treatment) or the Inspire device (hypoglossal nerve stimulator, which would involve a referral for consultation with an ENT surgeon, after careful selection, following inclusion criteria - also not first-line treatment). I explained the PAP treatment option to the patient in detail, as this is generally considered first-line treatment.  The patient indicated that she would be  willing to try PAP therapy again, if the need arises. I explained the importance of being compliant with PAP treatment, not only for insurance purposes but primarily to improve patient's symptoms symptoms, and for the patient's long term health benefit, including to reduce Her cardiovascular risks longer-term.    We will pick up our discussion about the next steps and treatment options after testing.  We will keep her posted as to the test results by phone call and/or MyChart messaging where  possible.  We will plan to follow-up in sleep clinic accordingly as well.  I answered all her questions today and the patient was in agreement.   I encouraged her to call with any interim questions, concerns, problems or updates or email us  through MyChart.  Generally speaking, sleep test authorizations may take up to 2 weeks, sometimes less, sometimes longer, the patient is encouraged to get in touch with us  if they do not hear back from the sleep lab staff directly within the next 2 weeks.  Thank you very much for allowing me to participate in the care of this nice patient. If I can be of any further assistance to you please do not hesitate to call me at [phone].  Sincerely,   Jennifer Fairy, MD, PhD

## 2023-08-07 NOTE — Patient Instructions (Signed)
 It was nice to see you again today.   As discussed, we will proceed with a home sleep test (HST) to re-establish your sleep apnea diagnosis and to get you a new machine. Our sleep lab staff will reach out to you to arrange for pickup and for tutorial of your test equipment - you will do the test at home that night and bring the test sensors back for data analysis the next day or whenever you are scheduled for drop off of your test equipment. I will write for a new machine after your HST confirms your obstructive sleep apnea diagnosis.   We will schedule a follow-up appointment after set up with your new PAP machine, typically within 31 to 89 days post treatment start. You will need to show compliance with usage and fulfill a minimum usage percentage (this is an insurance requirement).  After you have done your home sleep test, you can resume using your current machine until you get a new one.   Please continue to work on weight loss.  Getting used to CPAP and staying with the treatment long term does take time and patience and discipline. Untreated obstructive sleep apnea when it is moderate to severe can have an adverse impact on cardiovascular health and raise her risk for heart disease, arrhythmias, hypertension, congestive heart failure, stroke and diabetes. Untreated obstructive sleep apnea causes sleep disruption, nonrestorative sleep, and sleep deprivation. This can have an impact on your day to day functioning and cause daytime sleepiness and impairment of cognitive function, memory loss, mood disturbance, and problems focussing. Using CPAP regularly can improve these symptoms.

## 2023-08-18 ENCOUNTER — Ambulatory Visit (INDEPENDENT_AMBULATORY_CARE_PROVIDER_SITE_OTHER): Admitting: Neurology

## 2023-08-18 DIAGNOSIS — G4719 Other hypersomnia: Secondary | ICD-10-CM

## 2023-08-18 DIAGNOSIS — E66811 Obesity, class 1: Secondary | ICD-10-CM

## 2023-08-18 DIAGNOSIS — G4733 Obstructive sleep apnea (adult) (pediatric): Secondary | ICD-10-CM | POA: Diagnosis not present

## 2023-08-18 DIAGNOSIS — R351 Nocturia: Secondary | ICD-10-CM

## 2023-08-18 DIAGNOSIS — R635 Abnormal weight gain: Secondary | ICD-10-CM

## 2023-09-07 NOTE — Progress Notes (Signed)
 See procedure note.

## 2023-09-11 ENCOUNTER — Ambulatory Visit: Payer: Self-pay | Admitting: Neurology

## 2023-09-11 DIAGNOSIS — G4733 Obstructive sleep apnea (adult) (pediatric): Secondary | ICD-10-CM

## 2023-09-11 NOTE — Procedures (Signed)
 [facility]  HOME SLEEP TEST (SANSA) REPORT (Mail-Out Device):   STUDY DATE: 08/23/23  DOB: 06-10-1970  MRN: [id number]  ORDERING CLINICIAN: True Mar, MD, PhD   REFERRING CLINICIAN: Joshua Debby CROME, MD   CLINICAL INFORMATION/HISTORY: 53 year old female with an underlying medical history of elevated liver enzymes, headaches, abnormal uterine bleeding, prediabetes, iron deficiency anemia, urinary urgency, thrombocytosis, leukocytosis, depression, and obesity, who was previously diagnosed with obstructive sleep apnea. She is no longer on PAP therapy.  She found it difficult to tolerate, particularly the mask. She reports snoring and excessive daytime somnolence as well as witnessed apneas.   PATIENT'S LAST REPORTED EPWORTH SLEEPINESS SCORE (ESS): 15/24.  BMI (at the time of sleep clinic visit and/or test date): 34.9 kg/m  FINDINGS:   Study Protocol:    The SANSA single-point-of-skin-contact chest-worn sensor - an FDA cleared and DOT approved type 4 home sleep test device - measures eight physiological channels,  including blood oxygen saturation (measured via PPG [photoplethysmography]), EKG-derived heart rate, respiratory effort, chest movement (measured via accelerometer), snoring, body position, and actigraphy. The device is designed to be worn for up to 10 hours per study.   Sleep Summary:   Total Recording Time (hours, min): 9 hours, 58 min  Total Effective Sleep Time (hours, min):  7 hours, 17 min  Sleep Efficiency (%):    73%   Respiratory Indices:   Calculated sAHI (per hour):  21.1/hour         Oxygen Saturation Statistics:    Oxygen Saturation (%) Mean: 94.3%   Minimum oxygen saturation (%):                 82.7%   O2 Saturation Range (%): 82.7-100%   Time below or at 88% saturation: 4 min   Pulse Rate Statistics:   Pulse Mean (bpm):    76/min    Pulse Range (61-104/min)   Snoring: Mild to louder  IMPRESSION/DIAGNOSES:   OSA  (obstructive sleep apnea), moderate    RECOMMENDATIONS:   This home sleep test demonstrates moderate obstructive sleep apnea with a total AHI of 21.1/hour and O2 nadir of 82.7%.  Variable snoring was detected, ranging from mild to louder. Treatment with a positive airway pressure (PAP) device is recommended. The patient will be advised to proceed with an autoPAP titration/trial at home for now. A full night titration study may be considered to optimize treatment settings, monitor proper oxygen saturations and aid with improvement of tolerance and adherence, if needed down the road. Alternative treatment options may include a dental device through dentistry or orthodontics in selected patients or Inspire (hypoglossal nerve stimulator) in carefully selected patients (meeting inclusion criteria).  Concomitant weight loss is recommended (where clinically appropriate). Please note that untreated obstructive sleep apnea may carry additional perioperative morbidity. Patients with significant obstructive sleep apnea should receive perioperative PAP therapy and the surgeons and particularly the anesthesiologist should be informed of the diagnosis and the severity of the sleep disordered breathing. The patient should be cautioned not to drive, work at heights, or operate dangerous or heavy equipment when tired or sleepy. Review and reiteration of good sleep hygiene measures should be pursued with any patient. Other causes of the patient's symptoms, including circadian rhythm disturbances, an underlying mood disorder, medication effect and/or an underlying medical problem cannot be ruled out based on this test. Clinical correlation is recommended.  The patient and her referring provider will be notified of the test results. The patient will be seen in follow  up in sleep clinic at [facility].  I certify that I have reviewed the raw data recording prior to the issuance of this report in accordance with the standards of the  American Academy of Sleep Medicine (AASM).    INTERPRETING PHYSICIAN:   True Mar, MD, PhD Medical Director, [facility]) Diplomat, ABPN (Neurology and Sleep)   [facility] [address] [place], [place] [address] [phone]

## 2023-09-12 NOTE — Telephone Encounter (Signed)
Called pt and LVM (ok per DPR) asking for call back to discuss sleep study results. Left office number in message.

## 2023-09-12 NOTE — Telephone Encounter (Signed)
-----   Message from True Mar sent at 09/11/2023  5:22 PM EDT ----- Patient referred by PCP for reevaluation of her OSA, seen by me on 08/07/2023, patient had a HST on 08/23/2023.    Please call and notify the patient that the recent home sleep test showed obstructive sleep apnea in the moderate range. I recommend treatment in the form of autoPAP, which means, that we don't have  to bring her in for a sleep study with CPAP, but will let her start using a so called autoPAP machine at home, which is a CPAP-like machine with self-adjusting pressures. We will send the order to a  local DME company (of her choice, or as per insurance requirement). The DME representative will fit her with a mask, educate her on how to use the machine, how to put the mask on, etc. I have placed  an order in the chart. Please send the order, talk to patient, send report to referring MD. We will need a FU in sleep clinic for 10 weeks post-PAP set up, please arrange that with me or one of our  NPs. Also reinforce the need for compliance with treatment. Thanks,   True Mar, MD, PhD [facility])    ----- Message ----- From: Mar True, MD Sent: 09/11/2023   5:20 PM EDT To: True Mar, MD

## 2023-09-18 NOTE — Telephone Encounter (Signed)
-----   Message from True Mar sent at 09/11/2023  5:22 PM EDT ----- Patient referred by PCP for reevaluation of her OSA, seen by me on 08/07/2023, patient had a HST on 08/23/2023.    Please call and notify the patient that the recent home sleep test showed obstructive sleep apnea in the moderate range. I recommend treatment in the form of autoPAP, which means, that we don't have  to bring her in for a sleep study with CPAP, but will let her start using a so called autoPAP machine at home, which is a CPAP-like machine with self-adjusting pressures. We will send the order to a  local DME company (of her choice, or as per insurance requirement). The DME representative will fit her with a mask, educate her on how to use the machine, how to put the mask on, etc. I have placed  an order in the chart. Please send the order, talk to patient, send report to referring MD. We will need a FU in sleep clinic for 10 weeks post-PAP set up, please arrange that with me or one of our  NPs. Also reinforce the need for compliance with treatment. Thanks,   True Mar, MD, PhD [facility])    ----- Message ----- From: Mar True, MD Sent: 09/11/2023   5:20 PM EDT To: True Mar, MD

## 2023-09-18 NOTE — Telephone Encounter (Signed)
 I called pt and relayed results of her sleep study moderate OSA.  Relayed that autopap recommended.  Pt may have her old machine checked out.  She was unsure if over 5 yrs ld.  Did receive from [employer]. She understanding if gets new machine,  authorization and then they will call her with that information.  They will see her to going over new machine, supplies, mask fit.  She verbalized understanding. She  I will send message.  She understands plan to use machine 4hrs or more for insurance compliance.  I made appt for her with Dr. Buck 11-29-2023 0945. She did mention about her have leg movements at night, and her husband said that she had episode when he saw her mid day that her whole body shook.  I told her will relay to Dr. Buck and let her know if anything that she recommends.

## 2023-09-18 NOTE — Telephone Encounter (Signed)
-----   Message from Nena GORMAN Salt, RN sent at 09/18/2023  9:03 AM EDT -----   ----- Message ----- From: Buck Saucer, MD Sent: 09/11/2023   5:22 PM EDT To: Gna-Pod 4 Results  Patient referred by PCP for reevaluation of her OSA, seen by me on 08/07/2023, patient had a HST on 08/23/2023.    Please call and notify the patient that the recent home sleep test showed obstructive sleep apnea in the moderate range. I recommend treatment in the form of autoPAP, which means, that we don't have  to bring her in for a sleep study with CPAP, but will let her start using a so called autoPAP machine at home, which is a CPAP-like machine with self-adjusting pressures. We will send the order to a  local DME company (of her choice, or as per insurance requirement). The DME representative will fit her with a mask, educate her on how to use the machine, how to put the mask on, etc. I have placed  an order in the chart. Please send the order, talk to patient, send report to referring MD. We will need a FU in sleep clinic for 10 weeks post-PAP set up, please arrange that with me or one of our  NPs. Also reinforce the need for compliance with treatment. Thanks,   Saucer Buck, MD, PhD [facility])    ----- Message ----- From: Buck Saucer, MD Sent: 09/11/2023   5:20 PM EDT To: Saucer Buck, MD

## 2023-09-20 NOTE — Telephone Encounter (Signed)
 RE: new autopap machine Received: Yesterday New, Adine Neysa Nena GORMAN, RN; New, Bradley; Cain, Mitchell; Garcia, Patricia; Ziegler, Melissa; 1 other Received, thank you!       Previous Messages    ----- Message ----- From: Neysa Nena GORMAN, RN Sent: 09/18/2023  11:03 AM EDT To: Adine Leer; Avelina Sprung; Ephraim Dollar* Subject: new autopap machine                             Pt has old machine, that she did not use, she states.  She said got from you all, set up showing on airview 06-07-2016.  She is eligible for new machine.  New order in epic if pt wants.   Jennifer Mcknight, 53 y.o., Jul 09, 1970 MRN: [id number] Phone: [phone]   Thank you,  Particia RN

## 2023-10-01 ENCOUNTER — Other Ambulatory Visit: Payer: Self-pay | Admitting: Internal Medicine

## 2023-10-01 DIAGNOSIS — F331 Major depressive disorder, recurrent, moderate: Secondary | ICD-10-CM

## 2023-11-06 ENCOUNTER — Encounter: Payer: Self-pay | Admitting: Internal Medicine

## 2023-11-08 ENCOUNTER — Inpatient Hospital Stay: Admitting: Oncology

## 2023-11-08 ENCOUNTER — Inpatient Hospital Stay: Attending: Oncology

## 2023-11-08 VITALS — BP 148/100 | HR 97 | Temp 97.8°F | Resp 17 | Wt 221.2 lb

## 2023-11-08 DIAGNOSIS — N920 Excessive and frequent menstruation with regular cycle: Secondary | ICD-10-CM | POA: Diagnosis not present

## 2023-11-08 DIAGNOSIS — Z87891 Personal history of nicotine dependence: Secondary | ICD-10-CM | POA: Insufficient documentation

## 2023-11-08 DIAGNOSIS — Z793 Long term (current) use of hormonal contraceptives: Secondary | ICD-10-CM | POA: Insufficient documentation

## 2023-11-08 DIAGNOSIS — D72820 Lymphocytosis (symptomatic): Secondary | ICD-10-CM | POA: Insufficient documentation

## 2023-11-08 DIAGNOSIS — D500 Iron deficiency anemia secondary to blood loss (chronic): Secondary | ICD-10-CM | POA: Insufficient documentation

## 2023-11-08 DIAGNOSIS — Z79624 Long term (current) use of inhibitors of nucleotide synthesis: Secondary | ICD-10-CM | POA: Insufficient documentation

## 2023-11-08 DIAGNOSIS — D72829 Elevated white blood cell count, unspecified: Secondary | ICD-10-CM | POA: Diagnosis not present

## 2023-11-08 DIAGNOSIS — D75839 Thrombocytosis, unspecified: Secondary | ICD-10-CM | POA: Diagnosis not present

## 2023-11-08 LAB — CBC WITH DIFFERENTIAL (CANCER CENTER ONLY)
Abs Immature Granulocytes: 0.04 K/uL (ref 0.00–0.07)
Basophils Absolute: 0.1 K/uL (ref 0.0–0.1)
Basophils Relative: 1 %
Eosinophils Absolute: 0.6 K/uL — ABNORMAL HIGH (ref 0.0–0.5)
Eosinophils Relative: 4 %
HCT: 44.1 % (ref 36.0–46.0)
Hemoglobin: 15.1 g/dL — ABNORMAL HIGH (ref 12.0–15.0)
Immature Granulocytes: 0 %
Lymphocytes Relative: 29 %
Lymphs Abs: 4.1 K/uL — ABNORMAL HIGH (ref 0.7–4.0)
MCH: 29.4 pg (ref 26.0–34.0)
MCHC: 34.2 g/dL (ref 30.0–36.0)
MCV: 85.8 fL (ref 80.0–100.0)
Monocytes Absolute: 0.8 K/uL (ref 0.1–1.0)
Monocytes Relative: 6 %
Neutro Abs: 8.6 K/uL — ABNORMAL HIGH (ref 1.7–7.7)
Neutrophils Relative %: 60 %
Platelet Count: 466 K/uL — ABNORMAL HIGH (ref 150–400)
RBC: 5.14 MIL/uL — ABNORMAL HIGH (ref 3.87–5.11)
RDW: 13 % (ref 11.5–15.5)
WBC Count: 14.2 K/uL — ABNORMAL HIGH (ref 4.0–10.5)
nRBC: 0 % (ref 0.0–0.2)

## 2023-11-08 LAB — IRON AND IRON BINDING CAPACITY (CC-WL,HP ONLY)
Iron: 91 ug/dL (ref 28–170)
Saturation Ratios: 20 % (ref 10.4–31.8)
TIBC: 449 ug/dL (ref 250–450)
UIBC: 358 ug/dL (ref 148–442)

## 2023-11-08 LAB — FERRITIN: Ferritin: 60 ng/mL (ref 11–307)

## 2023-11-08 NOTE — Assessment & Plan Note (Signed)
 Chronic thrombocytosis with a history of high platelet count. Previous workup in August 2020 showed no evidence of JAK2, CALR, MPL, exon 12-15 mutations.  BCR/ABL 1 was negative.   Current high platelet count may be related to iron deficiency due to menorrhagia.   On her consultation with us  on 04/13/2023, labs reveal persistent thrombocytosis with platelet count of 683,000.  Hemoglobin 10.8, MCV 69.  White count 12,400 with mild lymphocytosis of 5000.  Repeat JAK2 mutation analysis, CALR, MPL, exon 12-15 mutation analysis were all negative.  BCR/ABL 1 was negative.  Ferritin decreased at 4.  Iron studies indicated evidence of iron deficiency with iron saturation of 4%, iron decreased to 24, increased iron binding capacity of 633.  We proceeded with IV iron using Feraheme x 2 doses.  Platelet count improved to 450,000 on 07/13/2023.  Hemoglobin normal at 14.  Repeat labs today showed overall stable platelet count of 466,000.  Iron studies show no evidence of iron deficiency currently.  - Schedule in-person follow-up in 6 months

## 2023-11-08 NOTE — Progress Notes (Unsigned)
 [facility]  HEMATOLOGY CLINIC PROGRESS NOTE  PATIENT NAME: Jennifer Mcknight   MR#: [id number] DOB: [birth year]  Patient Care Team: Joshua Debby CROME, MD as PCP - General  Date of visit: 11/08/2023   ASSESSMENT & PLAN:   Jennifer Mcknight is a 53 y.o.  lady with past medical history of menorrhagia, sleep apnea, was referred to our service in February 2025 for evaluation of thrombocytosis.  It was reactionary to iron deficiency state.  Workup negative for MPN.  Thrombocytosis Chronic thrombocytosis with a history of high platelet count. Previous workup in August 2020 showed no evidence of JAK2, CALR, MPL, exon 12-15 mutations.  BCR/ABL 1 was negative.   Current high platelet count may be related to iron deficiency due to menorrhagia.   On her consultation with us  on 04/13/2023, labs reveal persistent thrombocytosis with platelet count of 683,000.  Hemoglobin 10.8, MCV 69.  White count 12,400 with mild lymphocytosis of 5000.  Repeat JAK2 mutation analysis, CALR, MPL, exon 12-15 mutation analysis were all negative.  BCR/ABL 1 was negative.  Ferritin decreased at 4.  Iron studies indicated evidence of iron deficiency with iron saturation of 4%, iron decreased to 24, increased iron binding capacity of 633.  We proceeded with IV iron using Feraheme x 2 doses.  Platelet count improved to 450,000 on 07/13/2023.  Hemoglobin normal at 14.  Repeat labs today showed overall stable platelet count of 466,000.  Iron studies show no evidence of iron deficiency currently.  - Schedule in-person follow-up in 6 months  Iron deficiency anemia due to chronic blood loss Iron deficiency anemia likely secondary to menorrhagia. Reports heavy cycles every 28 days. Iron levels are low, contributing to high platelet count. Discussed that IV iron infusion can help improve iron levels and reduce platelet count. Explained that iron infusion should last at least three months, after which re-evaluation will be  done.  Hemoglobin 10.8, MCV 69 on 04/13/23.  Iron studies indicate iron deficiency.  We treated her with Feraheme weekly x 2 doses.  She had an IUD placed recently and menorrhagia has improved.  Chronic iron deficiency anemia previously managed with IV iron and currently well-managed with oral iron supplementation. Hemoglobin level has improved to 15.1 from a previous low of 9. She has a Mirena  IUD to manage menstrual bleeding, which has helped reduce bleeding episodes. - Continue oral iron supplementation once daily - Consider taking iron with vitamin C to enhance absorption   Leukocytosis Chronic intermittent leukocytosis.  Flow cytometry of peripheral blood was unremarkable on 04/13/2023.  BCR/ABL 1 negative.  No evidence of primary myeloproliferative neoplasm.    Chronic leukocytosis with a white blood cell count of 14,000, consistently elevated since 2016. The elevated count is considered her baseline. Discussion about potential impact of untreated sleep apnea on white blood cell count and overall energy levels.  Will continue to monitor this.   I spent a total of 20 minutes during this encounter with the patient including review of chart and various tests results, discussions about plan of care and coordination of care plan.  I reviewed lab results and outside records for this visit and discussed relevant results with the patient. Diagnosis, plan of care and treatment options were also discussed in detail with the patient. Opportunity provided to ask questions and answers provided to her apparent satisfaction. Provided instructions to call our clinic with any problems, questions or concerns prior to return visit. I recommended to continue follow-up with PCP and sub-specialists. She verbalized  understanding and agreed with the plan. No barriers to learning was detected.  Chinita Patten, MD  11/08/2023 11:54 AM  [facility] - A DEPT OF [facility] [address] [place] [address] Dept: [phone] Dept Fax: [phone]   CHIEF COMPLAINT/ REASON FOR VISIT:  Follow-up for thrombocytosis and intermittent leukocytosis, reactionary to iron deficiency.  Workup negative for MPN.  INTERVAL HISTORY:  Discussed the use of AI scribe software for clinical note transcription with the patient, who gave verbal consent to proceed.  History of Present Illness Jennifer Mcknight is a 53 year old female with a history of anemia and elevated white blood cell count who presents with fatigue. She is accompanied by her daughter, Larraine.  She has a history of anemia and has been taking iron supplements without any issues. Her hemoglobin has improved to 15.1 from a previous level of 9. She had received IV iron in the past and continues with oral supplementation. A Mirena  IUD was placed to help reduce menstrual bleeding; the patient reports that it has slowed the bleeding, although she occasionally takes four Advil to manage heavy bleeding.  Her white blood cell count has been consistently elevated since 2016, with recent counts ranging from 11,000 to 14,700. Previous bone marrow tests showed no signs of leukemia or other abnormalities. Her platelet count was previously high at 695,000 but has decreased to 466,000, which is slightly above the normal range.  She experiences extreme fatigue and has difficulty using a CPAP machine for suspected sleep apnea, finding it uncomfortable and unable to use it consistently.  She has quit smoking.   SUMMARY OF HEMATOLOGIC HISTORY:  On her follow-up visit with her PCP Dr. Joshua on 01/16/2023, she was found to have elevated platelet count at 647,000.  Previously her platelet count was 695,000 in September 2024.  Given persistent thrombocytosis, referral was sent to us  for further evaluation.  She was also found to have mild leukocytosis with white count of 14,700, anemia with hemoglobin of 10.2 in  November 2024.   She reports recent rapid weight gain and heavy menstrual cycles, which occur every 28 days. The patient is currently seeing a gynecologist for these issues and has been prescribed progesterone to manage the heavy bleeding. The patient also mentions a potential growth in the uterus, which is currently being investigated by her gynecologist.   In November 2020, the patient underwent a gallbladder removal surgery due to gallstones, which she does not recall. The patient also has sleep apnea and is due for a sleep study.   She did not have prior splenectomy. She had no prior history or diagnosis of cancer. Her age appropriate screening programs are up-to-date. Patient has never been diagnosed with thrombotic events.   Chronic thrombocytosis with a history of high platelet count. Previous workup in August 2020 showed no evidence of JAK2, CALR, MPL, exon 12-15 mutations.  BCR/ABL 1 was negative.   Current high platelet count may be related to iron deficiency due to menorrhagia.   On her consultation with us  on 04/13/2023, labs reveal persistent thrombocytosis with platelet count of 683,000.  Hemoglobin 10.8, MCV 69.  White count 12,400 with mild lymphocytosis of 5000.  Repeat JAK2 mutation analysis, CALR, MPL, exon 12-15 mutation analysis were all negative.  BCR/ABL 1 was negative.  Ferritin decreased at 4.  Iron studies indicated evidence of iron deficiency with iron saturation of 4%, iron decreased to 24, increased  iron binding capacity of 633.   We proceeded with IV iron using Feraheme x 2 doses.  Platelet count improved to 450,000 on 07/13/2023.  Hemoglobin normal at 14.   Chronic intermittent leukocytosis.  Flow cytometry of peripheral blood was unremarkable on 04/13/2023.  BCR/ABL 1 negative.  No evidence of primary myeloproliferative neoplasm.  Will continue to monitor this.  I have reviewed the past medical history, past surgical history, social history and family history with the  patient and they are unchanged from previous note.  ALLERGIES: She is allergic to cymbalta  [duloxetine  hcl].  MEDICATIONS:  Current Outpatient Medications  Medication Sig Dispense Refill   ferrous sulfate  325 (65 FE) MG EC tablet Take 1 tablet (325 mg total) by mouth daily with breakfast. 90 tablet 3   indapamide  (LOZOL ) 1.25 MG tablet Take 1 tablet (1.25 mg total) by mouth daily. 90 tablet 1   levonorgestrel  (MIRENA ) 20 MCG/DAY IUD 1 each by Intrauterine route once.     MAGNESIUM PO Take by mouth.     Multiple Vitamin (MULTIVITAMIN WITH MINERALS) TABS tablet Take 1 tablet by mouth daily.     valACYclovir  (VALTREX ) 500 MG tablet Take 1 tablet (500 mg total) by mouth daily. 90 tablet 0   Vilazodone  HCl (VIIBRYD ) 40 MG TABS TAKE ONE TABLET BY MOUTH ONE TIME DAILY 90 tablet 0   No current facility-administered medications for this visit.     REVIEW OF SYSTEMS:    Review of Systems - Oncology  All other pertinent systems were reviewed with the patient and are negative.  PHYSICAL EXAMINATION:    Onc Performance Status - 11/08/23 1143       ECOG Perf Status   ECOG Perf Status Fully active, able to carry on all pre-disease performance without restriction      KPS SCALE   KPS % SCORE Normal, no compliants, no evidence of disease           Vitals:   11/08/23 1133 11/08/23 1141  BP: (!) 160/110 (!) 148/100  Pulse:  97  Resp:  17  Temp:  97.8 F (36.6 C)  SpO2:  99%    Filed Weights   11/08/23 1141  Weight: 221 lb 3.2 oz (100.3 kg)     Physical Exam Constitutional:      General: She is not in acute distress.    Appearance: Normal appearance.  HENT:     Head: Normocephalic and atraumatic.  Cardiovascular:     Rate and Rhythm: Normal rate.  Pulmonary:     Effort: Pulmonary effort is normal. No respiratory distress.  Abdominal:     General: There is no distension.  Neurological:     General: No focal deficit present.     Mental Status: She is alert and  oriented to person, place, and time.  Psychiatric:        Mood and Affect: Mood normal.        Behavior: Behavior normal.     LABORATORY DATA:   I have reviewed the data as listed.  Results for orders placed or performed in visit on 11/08/23  Ferritin  Result Value Ref Range   Ferritin 60 11 - 307 ng/mL  Iron and Iron Binding Capacity ([facility] only)  Result Value Ref Range   Iron 91 28 - 170 ug/dL   TIBC 550 749 - 549 ug/dL   Saturation Ratios 20 10.4 - 31.8 %   UIBC 358 148 - 442 ug/dL  CBC with Differential (Cancer Center Only)  Result Value Ref Range   WBC Count 14.2 (H) 4.0 - 10.5 K/uL   RBC 5.14 (H) 3.87 - 5.11 MIL/uL   Hemoglobin 15.1 (H) 12.0 - 15.0 g/dL   HCT 55.8 63.9 - 53.9 %   MCV 85.8 80.0 - 100.0 fL   MCH 29.4 26.0 - 34.0 pg   MCHC 34.2 30.0 - 36.0 g/dL   RDW 86.9 88.4 - 84.4 %   Platelet Count 466 (H) 150 - 400 K/uL   nRBC 0.0 0.0 - 0.2 %   Neutrophils Relative % 60 %   Neutro Abs 8.6 (H) 1.7 - 7.7 K/uL   Lymphocytes Relative 29 %   Lymphs Abs 4.1 (H) 0.7 - 4.0 K/uL   Monocytes Relative 6 %   Monocytes Absolute 0.8 0.1 - 1.0 K/uL   Eosinophils Relative 4 %   Eosinophils Absolute 0.6 (H) 0.0 - 0.5 K/uL   Basophils Relative 1 %   Basophils Absolute 0.1 0.0 - 0.1 K/uL   Immature Granulocytes 0 %   Abs Immature Granulocytes 0.04 0.00 - 0.07 K/uL     RADIOGRAPHIC STUDIES:  No recent pertinent imaging studies available to review.  Orders Placed This Encounter  Procedures   CBC with Differential (Cancer Center Only)    Standing Status:   Future    Expected Date:   05/07/2024    Expiration Date:   08/05/2024   Iron and Iron Binding Capacity ([facility] only)    Standing Status:   Future    Expected Date:   05/07/2024    Expiration Date:   08/05/2024   Ferritin    Standing Status:   Future    Expected Date:   05/07/2024    Expiration Date:   08/05/2024     Future Appointments  Date Time Provider Department Center  11/29/2023  9:45 AM Buck Saucer, MD  [facility] None  05/08/2024  2:00 PM [facility] None  05/08/2024  2:30 PM Oscar Hank, Chinita, MD [facility] None     This document was completed utilizing speech recognition software. Grammatical errors, random word insertions, pronoun errors, and incomplete sentences are an occasional consequence of this system due to software limitations, ambient noise, and hardware issues. Any formal questions or concerns about the content, text or information contained within the body of this dictation should be directly addressed to the provider for clarification.

## 2023-11-08 NOTE — Assessment & Plan Note (Signed)
 Iron deficiency anemia likely secondary to menorrhagia. Reports heavy cycles every 28 days. Iron levels are low, contributing to high platelet count. Discussed that IV iron infusion can help improve iron levels and reduce platelet count. Explained that iron infusion should last at least three months, after which re-evaluation will be done.  Hemoglobin 10.8, MCV 69 on 04/13/23.  Iron studies indicate iron deficiency.  We treated her with Feraheme weekly x 2 doses.  She had an IUD placed recently and menorrhagia has improved.  Labs today showed much improved hemoglobin of 14, MCV now normal.  Iron saturation rose from 4% to 26%. These improvements are attributed to iron supplementation and IUD insertion to control bleeding.   She was prescribed ferrous sulfate  325 mg, to be taken once daily.

## 2023-11-10 ENCOUNTER — Encounter: Payer: Self-pay | Admitting: Oncology

## 2023-11-10 ENCOUNTER — Ambulatory Visit: Admitting: Oncology

## 2023-11-10 ENCOUNTER — Other Ambulatory Visit

## 2023-11-10 NOTE — Assessment & Plan Note (Signed)
 Chronic intermittent leukocytosis.  Flow cytometry of peripheral blood was unremarkable on 04/13/2023.  BCR/ABL 1 negative.  No evidence of primary myeloproliferative neoplasm.    Chronic leukocytosis with a white blood cell count of 14,000, consistently elevated since 2016. The elevated count is considered her baseline. Discussion about potential impact of untreated sleep apnea on white blood cell count and overall energy levels.  Will continue to monitor this.

## 2023-11-11 ENCOUNTER — Other Ambulatory Visit: Payer: Self-pay | Admitting: Internal Medicine

## 2023-11-11 DIAGNOSIS — E66811 Obesity, class 1: Secondary | ICD-10-CM | POA: Insufficient documentation

## 2023-11-11 DIAGNOSIS — G4733 Obstructive sleep apnea (adult) (pediatric): Secondary | ICD-10-CM

## 2023-11-11 MED ORDER — TIRZEPATIDE-WEIGHT MANAGEMENT 2.5 MG/0.5ML ~~LOC~~ SOLN: 2.5000 mg | SUBCUTANEOUS | 0 refills | Status: DC

## 2023-11-15 ENCOUNTER — Telehealth: Payer: Self-pay | Admitting: Neurology

## 2023-11-15 NOTE — Telephone Encounter (Signed)
 Pt called to report she just used her CPAP for the 1st time last night, her initial CPAP f/u has been r/s.  Pt was told to being CPAP and power cord

## 2023-11-15 NOTE — Telephone Encounter (Signed)
 thanks

## 2023-11-26 ENCOUNTER — Other Ambulatory Visit: Payer: Self-pay | Admitting: Internal Medicine

## 2023-11-26 DIAGNOSIS — I10 Essential (primary) hypertension: Secondary | ICD-10-CM

## 2023-11-29 ENCOUNTER — Ambulatory Visit: Admitting: Neurology

## 2023-12-04 ENCOUNTER — Encounter: Payer: Self-pay | Admitting: Neurology

## 2023-12-05 ENCOUNTER — Other Ambulatory Visit: Payer: Self-pay | Admitting: Internal Medicine

## 2023-12-05 DIAGNOSIS — G4733 Obstructive sleep apnea (adult) (pediatric): Secondary | ICD-10-CM

## 2023-12-05 DIAGNOSIS — E66811 Obesity, class 1: Secondary | ICD-10-CM

## 2023-12-06 ENCOUNTER — Other Ambulatory Visit: Payer: Self-pay | Admitting: Internal Medicine

## 2023-12-06 DIAGNOSIS — E66811 Obesity, class 1: Secondary | ICD-10-CM

## 2023-12-06 DIAGNOSIS — E6609 Other obesity due to excess calories: Secondary | ICD-10-CM

## 2023-12-06 MED ORDER — TIRZEPATIDE-WEIGHT MANAGEMENT 5 MG/0.5ML ~~LOC~~ SOLN: 5.0000 mg | SUBCUTANEOUS | 0 refills | Status: DC

## 2023-12-21 NOTE — Progress Notes (Signed)
 PATIENT: Jennifer Mcknight DOB: 11-12-1970  REASON FOR VISIT: follow up HISTORY FROM: patient PRIMARY NEUROLOGIST:   Virtual Visit via Video Note  I connected with Jennifer Mcknight on 12/25/23 at  1:30 PM EST by a video enabled telemedicine application located remotely at [facility] and verified that I am speaking with the correct person using two identifiers who was located at their own home.  Verified that she is currently in [place]    I discussed the limitations of evaluation and management by telemedicine and the availability of in person appointments. The patient expressed understanding and agreed to proceed.   PATIENT: Jennifer Mcknight DOB: [birth year]  REASON FOR VISIT: follow up HISTORY FROM: patient  HISTORY OF PRESENT ILLNESS: Today 12/25/23:  Merelyn Klump is a 53 y.o. female with a history of OSA on CPAP. Returns today for follow-up.  This is her initial CPAP compliance visit.  She reports that the CPAP is working well for her.  She does notice improvement in her fatigue level.  Currently wearing the DreamWear mask.  She states that she is interested in going on Zepbound .  She has discussed with her PCP but they were unable to get it approved.  She plans to discuss with them again.  Her download is below        REVIEW OF SYSTEMS: Out of a complete 14 system review of symptoms, the patient complains only of the following symptoms, and all other reviewed systems are negative.  ALLERGIES: Allergies  Allergen Reactions   Cymbalta  [Duloxetine  Hcl] Nausea And Vomiting    HOME MEDICATIONS: Outpatient Medications Prior to Visit  Medication Sig Dispense Refill   ferrous sulfate  325 (65 FE) MG EC tablet Take 1 tablet (325 mg total) by mouth daily with breakfast. 90 tablet 3   indapamide  (LOZOL ) 1.25 MG tablet TAKE ONE TABLET BY MOUTH ONE TIME DAILY 90 tablet 0   levonorgestrel  (MIRENA ) 20 MCG/DAY IUD 1 each by Intrauterine route once.     MAGNESIUM PO  Take by mouth.     Multiple Vitamin (MULTIVITAMIN WITH MINERALS) TABS tablet Take 1 tablet by mouth daily.     tirzepatide  5 MG/0.5ML injection vial Inject 5 mg into the skin once a week. 2 mL 0   valACYclovir  (VALTREX ) 500 MG tablet Take 1 tablet (500 mg total) by mouth daily. 90 tablet 0   Vilazodone  HCl (VIIBRYD ) 40 MG TABS TAKE ONE TABLET BY MOUTH ONE TIME DAILY 90 tablet 0   No facility-administered medications prior to visit.    PAST MEDICAL HISTORY: Past Medical History:  Diagnosis Date   Depression    Elevated liver enzymes    Family history of adverse reaction to anesthesia    mother had PONV    Headache    rarely   PONV (postoperative nausea and vomiting)    Sleep apnea    no CPAP, per patient unable to tolerate    PAST SURGICAL HISTORY: Past Surgical History:  Procedure Laterality Date   Anemia     BREAST SURGERY     CESAREAN SECTION     CHOLECYSTECTOMY N/A 12/31/2018   Procedure: LAPAROSCOPIC CHOLECYSTECTOMY;  Surgeon: Rubin Calamity, MD;  Location[facility] OR;  Service: General;  Laterality: N/A;   MASTOPEXY     NASAL SINUS SURGERY     REDUCTION MAMMAPLASTY Bilateral    Breast Lift   TUBAL LIGATION      FAMILY HISTORY: Family History  Problem Relation Age of Onset   Diabetes  Father    Depression Father    Sleep apnea Sister    Alcohol abuse Other    Drug abuse Other    Depression Other    Cancer Neg Hx    Heart disease Neg Hx    Early death Neg Hx    Hyperlipidemia Neg Hx    Hypertension Neg Hx    Kidney disease Neg Hx    Learning disabilities Neg Hx    Stroke Neg Hx     SOCIAL HISTORY: Social History   Socioeconomic History   Marital status: Married    Spouse name: Not on file   Number of children: Not on file   Years of education: Not on file   Highest education level: Not on file  Occupational History   Not on file  Tobacco Use   Smoking status: Former   Smokeless tobacco: Never  Vaping Use   Vaping status: Never Used  Substance  and Sexual Activity   Alcohol use: Yes    Comment: rarely   Drug use: No   Sexual activity: Yes    Partners: Male    Birth control/protection: Surgical  Other Topics Concern   Not on file  Social History Narrative   Pt lives with family    Homemaker    Social Drivers of Health   Financial Resource Strain: Patient Declined (03/24/2023)   Received from [facility]   Overall Financial Resource Strain (CARDIA)    Difficulty of Paying Living Expenses: Patient declined  Food Insecurity: No Food Insecurity (04/13/2023)   Hunger Vital Sign    Worried About Running Out of Food in the Last Year: Never true    Ran Out of Food in the Last Year: Never true  Transportation Needs: No Transportation Needs (04/13/2023)   PRAPARE - Administrator, Civil Service (Medical): No    Lack of Transportation (Non-Medical): No  Physical Activity: Unknown (05/16/2023)   Exercise Vital Sign    Days of Exercise per Week: 1 day    Minutes of Exercise per Session: Not on file  Stress: Stress Concern Present (05/16/2023)   [employer] of Occupational Health - Occupational Stress Questionnaire    Feeling of Stress : To some extent  Social Connections: Unknown (05/16/2023)   Social Connection and Isolation Panel    Frequency of Communication with Friends and Family: Not on file    Frequency of Social Gatherings with Friends and Family: Not on file    Attends Religious Services: Not on file    Active Member of Clubs or Organizations: No    Attends Banker Meetings: Not on file    Marital Status: Not on file  Intimate Partner Violence: Not At Risk (05/23/2023)   Received from [facility]   HITS    Over the last 12 months how often did your partner physically hurt you?: Never    Over the last 12 months how often did your partner insult you or talk down to you?: Never    Over the last 12 months how often did your partner threaten you with physical harm?: Never    Over the last 12  months how often did your partner scream or curse at you?: Never      PHYSICAL EXAM Generalized: Well developed, in no acute distress   Neurological examination  Mentation: Alert oriented to time, place, history taking. Follows all commands speech and language fluent Cranial nerve II-XII: Facial symmetry noted.   DIAGNOSTIC DATA (LABS,  IMAGING, TESTING) - I reviewed patient records, labs, notes, testing and imaging myself where available.  Lab Results  Component Value Date   WBC 14.2 (H) 11/08/2023   HGB 15.1 (H) 11/08/2023   HCT 44.1 11/08/2023   MCV 85.8 11/08/2023   PLT 466 (H) 11/08/2023      Component Value Date/Time   NA 138 04/18/2023 1637   K 4.0 04/18/2023 1637   CL 107 04/18/2023 1637   CO2 22 04/18/2023 1637   GLUCOSE 134 (H) 04/18/2023 1637   BUN 15 04/18/2023 1637   CREATININE 0.71 04/18/2023 1637   CREATININE 0.72 04/13/2023 1447   CALCIUM 8.9 04/18/2023 1637   PROT 7.1 04/18/2023 1637   ALBUMIN 3.8 04/18/2023 1637   AST 33 04/18/2023 1637   AST 24 04/13/2023 1447   ALT 33 04/18/2023 1637   ALT 24 04/13/2023 1447   ALKPHOS 55 04/18/2023 1637   BILITOT 0.4 04/18/2023 1637   BILITOT 0.3 04/13/2023 1447   GFRNONAA >60 04/18/2023 1637   GFRNONAA >60 04/13/2023 1447   GFRAA >60 02/05/2019 1014   Lab Results  Component Value Date   CHOL 275 (H) 01/16/2023   HDL 58.70 01/16/2023   LDLCALC 169 (H) 01/16/2023   LDLDIRECT 185.0 07/27/2021   TRIG 238.0 (H) 01/16/2023   CHOLHDL 5 01/16/2023   Lab Results  Component Value Date   HGBA1C 5.5 05/16/2023   Lab Results  Component Value Date   VITAMINB12 411 01/10/2018   Lab Results  Component Value Date   TSH 2.60 11/10/2022      ASSESSMENT AND PLAN 53 y.o. year old female  has a past medical history of Depression, Elevated liver enzymes, Family history of adverse reaction to anesthesia, Headache, PONV (postoperative nausea and vomiting), and Sleep apnea. here with:  OSA on CPAP  CPAP compliance  excellent Residual AHI is good Encouraged patient to continue using CPAP nightly and > 4 hours each night F/U in 1 year or sooner if needed   Duwaine Russell, MSN, NP-C 12/25/2023, 1:37 PM [facility] [address] [place], [place] [address] [phone]  The patient's condition requires frequent monitoring and adjustments in the treatment plan, reflecting the ongoing complexity of care.  This provider is the continuing focal point for all needed services for this condition.

## 2023-12-25 ENCOUNTER — Other Ambulatory Visit: Payer: Self-pay | Admitting: Internal Medicine

## 2023-12-25 ENCOUNTER — Telehealth: Admitting: Adult Health

## 2023-12-25 DIAGNOSIS — E6609 Other obesity due to excess calories: Secondary | ICD-10-CM

## 2023-12-25 DIAGNOSIS — G4733 Obstructive sleep apnea (adult) (pediatric): Secondary | ICD-10-CM

## 2024-01-01 ENCOUNTER — Ambulatory Visit: Payer: Self-pay | Admitting: Internal Medicine

## 2024-01-01 ENCOUNTER — Ambulatory Visit (INDEPENDENT_AMBULATORY_CARE_PROVIDER_SITE_OTHER): Admitting: Internal Medicine

## 2024-01-01 ENCOUNTER — Encounter: Payer: Self-pay | Admitting: Internal Medicine

## 2024-01-01 VITALS — BP 136/84 | HR 75 | Temp 97.9°F | Resp 16 | Ht 67.0 in | Wt 217.6 lb

## 2024-01-01 DIAGNOSIS — R7303 Prediabetes: Secondary | ICD-10-CM

## 2024-01-01 DIAGNOSIS — D75839 Thrombocytosis, unspecified: Secondary | ICD-10-CM | POA: Diagnosis not present

## 2024-01-01 DIAGNOSIS — D72829 Elevated white blood cell count, unspecified: Secondary | ICD-10-CM | POA: Diagnosis not present

## 2024-01-01 DIAGNOSIS — Z6834 Body mass index (BMI) 34.0-34.9, adult: Secondary | ICD-10-CM

## 2024-01-01 DIAGNOSIS — Z0001 Encounter for general adult medical examination with abnormal findings: Secondary | ICD-10-CM

## 2024-01-01 DIAGNOSIS — G4733 Obstructive sleep apnea (adult) (pediatric): Secondary | ICD-10-CM

## 2024-01-01 DIAGNOSIS — I10 Essential (primary) hypertension: Secondary | ICD-10-CM

## 2024-01-01 DIAGNOSIS — E876 Hypokalemia: Secondary | ICD-10-CM

## 2024-01-01 DIAGNOSIS — Z0000 Encounter for general adult medical examination without abnormal findings: Secondary | ICD-10-CM | POA: Diagnosis not present

## 2024-01-01 DIAGNOSIS — E66811 Obesity, class 1: Secondary | ICD-10-CM

## 2024-01-01 DIAGNOSIS — E781 Pure hyperglyceridemia: Secondary | ICD-10-CM

## 2024-01-01 DIAGNOSIS — R4589 Other symptoms and signs involving emotional state: Secondary | ICD-10-CM | POA: Insufficient documentation

## 2024-01-01 DIAGNOSIS — T502X5A Adverse effect of carbonic-anhydrase inhibitors, benzothiadiazides and other diuretics, initial encounter: Secondary | ICD-10-CM

## 2024-01-01 LAB — BASIC METABOLIC PANEL WITH GFR
BUN: 11 mg/dL (ref 6–23)
CO2: 26 meq/L (ref 19–32)
Calcium: 9.7 mg/dL (ref 8.4–10.5)
Chloride: 98 meq/L (ref 96–112)
Creatinine, Ser: 0.75 mg/dL (ref 0.40–1.20)
GFR: 91.77 mL/min (ref >60.00)
Glucose, Bld: 82 mg/dL (ref 70–99)
Potassium: 3.3 meq/L — ABNORMAL LOW (ref 3.5–5.1)
Sodium: 135 meq/L (ref 135–145)

## 2024-01-01 LAB — HEMOGLOBIN A1C: Hgb A1c MFr Bld: 5.8 % (ref 4.6–6.5)

## 2024-01-01 LAB — TSH: TSH: 2.05 u[IU]/mL (ref 0.35–5.50)

## 2024-01-01 LAB — HEPATIC FUNCTION PANEL
ALT: 22 U/L (ref 0–35)
AST: 24 U/L (ref 0–37)
Albumin: 4.6 g/dL (ref 3.5–5.2)
Alkaline Phosphatase: 61 U/L (ref 39–117)
Bilirubin, Direct: 0.1 mg/dL (ref 0.0–0.3)
Total Bilirubin: 0.4 mg/dL (ref 0.2–1.2)
Total Protein: 8 g/dL (ref 6.0–8.3)

## 2024-01-01 LAB — LIPID PANEL
Cholesterol: 236 mg/dL — ABNORMAL HIGH (ref 0–200)
HDL: 41.6 mg/dL (ref >39.00)
LDL Cholesterol: 151 mg/dL — ABNORMAL HIGH (ref 0–99)
NonHDL: 194.1
Total CHOL/HDL Ratio: 6
Triglycerides: 216 mg/dL — ABNORMAL HIGH (ref 0.0–149.0)
VLDL: 43.2 mg/dL — ABNORMAL HIGH (ref 0.0–40.0)

## 2024-01-01 MED ORDER — TIRZEPATIDE-WEIGHT MANAGEMENT 7.5 MG/0.5ML ~~LOC~~ SOLN: 7.5000 mg | SUBCUTANEOUS | 0 refills | Status: DC

## 2024-01-01 MED ORDER — ALPRAZOLAM 0.25 MG PO TABS: 0.2500 mg | ORAL_TABLET | ORAL | 1 refills | Status: DC | PRN

## 2024-01-01 MED ORDER — POTASSIUM CHLORIDE ER 10 MEQ PO TBCR: 10.0000 meq | EXTENDED_RELEASE_TABLET | Freq: Two times a day (BID) | ORAL | 1 refills | Status: AC

## 2024-01-01 NOTE — Patient Instructions (Signed)

## 2024-01-01 NOTE — Progress Notes (Signed)
 "  Subjective:  Patient ID: Jennifer Mcknight, female    DOB: 07/28/1970  Age: 53 y.o. MRN: [id number]  CC: Annual Exam, Hypertension, and Hyperlipidemia   HPI Jennifer Mcknight presents for a CPX and f/up ---  Discussed the use of AI scribe software for clinical note transcription with the patient, who gave verbal consent to proceed.  History of Present Illness Jennifer Mcknight is a 53 year old female who presents for follow-up regarding her anemia and sleep apnea management.  She reports improvement in her symptoms since the last visit. She has had a Mirena  IUD placed, which has helped control her menstrual bleeding, and she is currently on iron supplements. She has also received iron infusions in the past.  She has started using a CPAP machine, which has improved her sleep quality and overall restfulness.  Her iron levels were checked within the past couple of months at the cancer center.  She experiences blurred vision and palpitations, which she attributes to 'white coat syndrome' and notes these symptoms occur when she visits the clinic. She denies any side effects from her current medications.  She took half of a 0.25 mg Xanax  before the visit, which she believes may have affected her blood pressure reading.  No headaches, chest pain, shortness of breath, night sweats, rashes, abdominal pain, or constipation.     Outpatient Medications Prior to Visit  Medication Sig Dispense Refill   ferrous sulfate  325 (65 FE) MG EC tablet Take 1 tablet (325 mg total) by mouth daily with breakfast. 90 tablet 3   indapamide  (LOZOL ) 1.25 MG tablet TAKE ONE TABLET BY MOUTH ONE TIME DAILY 90 tablet 0   levonorgestrel  (MIRENA ) 20 MCG/DAY IUD 1 each by Intrauterine route once.     MAGNESIUM PO Take by mouth.     Multiple Vitamin (MULTIVITAMIN WITH MINERALS) TABS tablet Take 1 tablet by mouth daily.     valACYclovir  (VALTREX ) 500 MG tablet Take 1 tablet (500 mg total) by mouth daily. (Patient taking differently:  Take 500 mg by mouth as needed.) 90 tablet 0   Vilazodone  HCl (VIIBRYD ) 40 MG TABS TAKE ONE TABLET BY MOUTH ONE TIME DAILY 90 tablet 0   ALPRAZolam  (XANAX ) 0.25 MG tablet Take 0.25 mg by mouth as needed for anxiety (Only for surgey.).     ZEPBOUND  5 MG/0.5ML injection vial INJECT 0.5 ML (5 MG) UNDER THE SKIN ONCE WEEKLY (0.5ML= 50 UNITS) 2 mL 0   No facility-administered medications prior to visit.    ROS Review of Systems  Constitutional:  Negative for appetite change, chills, diaphoresis, fatigue and fever.  HENT: Negative.  Negative for sore throat and trouble swallowing.   Eyes:  Negative for visual disturbance.  Respiratory:  Positive for apnea. Negative for cough, chest tightness, shortness of breath and wheezing.   Cardiovascular:  Negative for chest pain, palpitations and leg swelling.  Gastrointestinal: Negative.  Negative for abdominal pain, constipation, diarrhea, nausea and vomiting.  Endocrine: Negative.   Genitourinary: Negative.  Negative for difficulty urinating.  Musculoskeletal: Negative.  Negative for arthralgias and myalgias.  Skin: Negative.   Neurological:  Negative for dizziness, weakness and light-headedness.  Hematological:  Negative for adenopathy. Does not bruise/bleed easily.  Psychiatric/Behavioral:  Negative for behavioral problems, confusion, decreased concentration, dysphoric mood, self-injury and sleep disturbance. The patient is nervous/anxious. The patient is not hyperactive.     Objective:  BP 136/84 (BP Location: Left Arm, Patient Position: Sitting, Cuff Size: Normal)   Pulse 75   Temp 97.9  F (36.6 C) (Oral)   Resp 16   Ht 5' 7 (1.702 m)   Wt 217 lb 9.6 oz (98.7 kg)   LMP  (LMP Unknown)   SpO2 94%   Breastfeeding No   BMI 34.08 kg/m   BP Readings from Last 3 Encounters:  01/01/24 136/84  11/08/23 (!) 148/100  08/07/23 129/82    Wt Readings from Last 3 Encounters:  01/01/24 217 lb 9.6 oz (98.7 kg)  11/08/23 221 lb 3.2 oz (100.3 kg)   08/07/23 223 lb 6.4 oz (101.3 kg)    Physical Exam Vitals reviewed.  Constitutional:      Appearance: Normal appearance.  HENT:     Nose: Nose normal.     Mouth/Throat:     Mouth: Mucous membranes are moist.  Eyes:     General: No scleral icterus.    Conjunctiva/sclera: Conjunctivae normal.  Cardiovascular:     Rate and Rhythm: Normal rate and regular rhythm.     Heart sounds: No murmur heard.    No friction rub. No gallop.     Comments: She refused an EKG today Pulmonary:     Effort: Pulmonary effort is normal.     Breath sounds: No stridor. No wheezing, rhonchi or rales.  Abdominal:     General: Abdomen is flat. Bowel sounds are normal. There is no distension.     Palpations: There is no hepatomegaly, splenomegaly or mass.     Tenderness: There is no abdominal tenderness. There is no guarding or rebound.  Musculoskeletal:     Cervical back: Neck supple.     Right lower leg: No edema.     Left lower leg: No edema.  Lymphadenopathy:     Cervical: No cervical adenopathy.  Skin:    General: Skin is warm and dry.  Neurological:     General: No focal deficit present.     Mental Status: She is alert.  Psychiatric:        Mood and Affect: Mood normal.        Behavior: Behavior normal.     Lab Results  Component Value Date   WBC 14.2 (H) 11/08/2023   HGB 15.1 (H) 11/08/2023   HCT 44.1 11/08/2023   PLT 466 (H) 11/08/2023   GLUCOSE 82 01/01/2024   CHOL 236 (H) 01/01/2024   TRIG 216.0 (H) 01/01/2024   HDL 41.60 01/01/2024   LDLDIRECT 185.0 07/27/2021   LDLCALC 151 (H) 01/01/2024   ALT 22 01/01/2024   AST 24 01/01/2024   NA 135 01/01/2024   K 3.3 (L) 01/01/2024   CL 98 01/01/2024   CREATININE 0.75 01/01/2024   BUN 11 01/01/2024   CO2 26 01/01/2024   TSH 2.05 01/01/2024   INR 1.0 02/17/2020   HGBA1C 5.8 01/01/2024    DG Chest Portable 1 View Result Date: 04/18/2023 CLINICAL DATA:  Syncope. EXAM: PORTABLE CHEST 1 VIEW COMPARISON:  Chest radiograph dated  11/10/2022. FINDINGS: The heart size and mediastinal contours are within normal limits. Both lungs are clear. The visualized skeletal structures are unremarkable. IMPRESSION: No active disease. Electronically Signed   By: Vanetta Chou M.D.   On: 04/18/2023 17:31    Assessment & Plan:  Thrombocytosis -     Hepatic function panel; Future  Leukocytosis, unspecified type -     Hepatic function panel; Future  Essential hypertension- Her BP is adequately well controlled. Will treat the hypokalemia. -     Basic metabolic panel with GFR; Future -  TSH; Future -     Potassium Chloride  ER; Take 1 tablet (10 mEq total) by mouth 2 (two) times daily.  Dispense: 180 tablet; Refill: 1  Prediabetes -     Basic metabolic panel with GFR; Future -     Hemoglobin A1c; Future  Pure hyperglyceridemia -     Lipid panel; Future  Encounter for general adult medical examination with abnormal findings- Exam completed, labs reviewed, vaccines reviewed, cancer screenings are UTD, pt ed material was given.   Anxiety about health -     ALPRAZolam ; Take 1 tablet (0.25 mg total) by mouth as needed for anxiety (Only for surgey.).  Dispense: 30 tablet; Refill: 1  OSA (obstructive sleep apnea) -     Tirzepatide -Weight Management; Inject 7.5 mg into the skin once a week.  Dispense: 4 mL; Refill: 0  Class 1 obesity due to excess calories with serious comorbidity and body mass index (BMI) of 34.0 to 34.9 in adult -     Tirzepatide -Weight Management; Inject 7.5 mg into the skin once a week.  Dispense: 4 mL; Refill: 0  Diuretic-induced hypokalemia -     Potassium Chloride  ER; Take 1 tablet (10 mEq total) by mouth 2 (two) times daily.  Dispense: 180 tablet; Refill: 1     Follow-up: Return in about 6 months (around 06/30/2024).  Debby Molt, MD "

## 2024-01-02 ENCOUNTER — Encounter: Payer: Self-pay | Admitting: Internal Medicine

## 2024-01-12 ENCOUNTER — Other Ambulatory Visit: Payer: Self-pay | Admitting: Internal Medicine

## 2024-01-12 ENCOUNTER — Telehealth: Payer: Self-pay

## 2024-01-12 DIAGNOSIS — F411 Generalized anxiety disorder: Secondary | ICD-10-CM | POA: Insufficient documentation

## 2024-01-12 MED ORDER — ALPRAZOLAM 0.25 MG PO TABS: 0.2500 mg | ORAL_TABLET | Freq: Two times a day (BID) | ORAL | 0 refills | Status: AC | PRN

## 2024-01-12 NOTE — Telephone Encounter (Signed)
 Please advise so I can call the pharmacy

## 2024-01-12 NOTE — Telephone Encounter (Signed)
 Copied from CRM #[id number]. Topic: Clinical - Medication Question >> Jan 12, 2024 11:18 AM China J wrote: Reason for CRM: ALPRAZolam  (XANAX ) 0.25 MG tablet was sent over on 11/10. It says to take 1 as needed for anxiety only for surgery, but the pharmacy is needing a maximum amount of medication.   Please call pharmacy back at [phone].

## 2024-01-16 ENCOUNTER — Other Ambulatory Visit: Payer: Self-pay | Admitting: Internal Medicine

## 2024-01-16 DIAGNOSIS — F331 Major depressive disorder, recurrent, moderate: Secondary | ICD-10-CM

## 2024-02-02 ENCOUNTER — Encounter: Payer: Self-pay | Admitting: Internal Medicine

## 2024-02-03 ENCOUNTER — Other Ambulatory Visit: Payer: Self-pay | Admitting: Internal Medicine

## 2024-02-03 DIAGNOSIS — E66811 Obesity, class 1: Secondary | ICD-10-CM

## 2024-02-03 DIAGNOSIS — G4733 Obstructive sleep apnea (adult) (pediatric): Secondary | ICD-10-CM

## 2024-02-03 MED ORDER — TIRZEPATIDE-WEIGHT MANAGEMENT 10 MG/0.5ML ~~LOC~~ SOLN: 10.0000 mg | SUBCUTANEOUS | 0 refills | Status: AC

## 2024-02-07 DIAGNOSIS — L820 Inflamed seborrheic keratosis: Secondary | ICD-10-CM | POA: Diagnosis not present

## 2024-03-08 ENCOUNTER — Other Ambulatory Visit: Payer: Self-pay | Admitting: Internal Medicine

## 2024-03-08 DIAGNOSIS — G4733 Obstructive sleep apnea (adult) (pediatric): Secondary | ICD-10-CM

## 2024-03-08 DIAGNOSIS — E66811 Obesity, class 1: Secondary | ICD-10-CM

## 2024-03-08 MED ORDER — TIRZEPATIDE-WEIGHT MANAGEMENT 2.5 MG/0.5ML ~~LOC~~ SOLN: 2.5000 mg | SUBCUTANEOUS | 0 refills | Status: AC

## 2024-03-10 ENCOUNTER — Other Ambulatory Visit: Payer: Self-pay | Admitting: Internal Medicine

## 2024-03-10 DIAGNOSIS — I10 Essential (primary) hypertension: Secondary | ICD-10-CM

## 2024-03-11 ENCOUNTER — Telehealth: Payer: Self-pay

## 2024-03-11 NOTE — Telephone Encounter (Signed)
 Copied from CRM [id number]. Topic: Clinical - Prescription Issue >> Mar 11, 2024  2:24 PM Willma R wrote: Reason for CRM: Patient states the prescription, tirzepatide  (ZEPBOUND ) 2.5 MG/0.5ML injection vial, is supposed to be 12.5 MG. Is requesting it be updated and resent to: [facility] [place], [place] - [id number] [phone] [place] [address] Phone: [phone] Fax: [phone]  Patient can be reached at [phone]

## 2024-03-13 NOTE — Telephone Encounter (Signed)
 Awaiting provider approval.  ?

## 2024-05-08 ENCOUNTER — Inpatient Hospital Stay: Admitting: Oncology

## 2024-05-08 ENCOUNTER — Inpatient Hospital Stay

## 2024-12-19 ENCOUNTER — Telehealth: Admitting: Adult Health
